# Patient Record
Sex: Male | Born: 1960 | Race: Black or African American | Hispanic: No | Marital: Married | Smoking: Former smoker
Health system: Southern US, Community
[De-identification: ages and names within clinical notes are randomized; demographics above are authoritative.]

## PROBLEM LIST (undated history)

## (undated) DIAGNOSIS — K5900 Constipation, unspecified: Secondary | ICD-10-CM

## (undated) DIAGNOSIS — E785 Hyperlipidemia, unspecified: Secondary | ICD-10-CM

## (undated) DIAGNOSIS — I219 Acute myocardial infarction, unspecified: Secondary | ICD-10-CM

## (undated) DIAGNOSIS — M2550 Pain in unspecified joint: Secondary | ICD-10-CM

## (undated) DIAGNOSIS — M1990 Unspecified osteoarthritis, unspecified site: Secondary | ICD-10-CM

## (undated) DIAGNOSIS — E669 Obesity, unspecified: Secondary | ICD-10-CM

## (undated) DIAGNOSIS — M5126 Other intervertebral disc displacement, lumbar region: Secondary | ICD-10-CM

## (undated) DIAGNOSIS — Z9989 Dependence on other enabling machines and devices: Secondary | ICD-10-CM

## (undated) DIAGNOSIS — G8929 Other chronic pain: Secondary | ICD-10-CM

## (undated) DIAGNOSIS — R600 Localized edema: Secondary | ICD-10-CM

## (undated) DIAGNOSIS — J302 Other seasonal allergic rhinitis: Secondary | ICD-10-CM

## (undated) DIAGNOSIS — J309 Allergic rhinitis, unspecified: Secondary | ICD-10-CM

## (undated) DIAGNOSIS — N529 Male erectile dysfunction, unspecified: Secondary | ICD-10-CM

## (undated) DIAGNOSIS — R0602 Shortness of breath: Secondary | ICD-10-CM

## (undated) DIAGNOSIS — M5136 Other intervertebral disc degeneration, lumbar region: Secondary | ICD-10-CM

## (undated) DIAGNOSIS — M51369 Other intervertebral disc degeneration, lumbar region without mention of lumbar back pain or lower extremity pain: Secondary | ICD-10-CM

## (undated) DIAGNOSIS — G039 Meningitis, unspecified: Secondary | ICD-10-CM

## (undated) DIAGNOSIS — G4733 Obstructive sleep apnea (adult) (pediatric): Secondary | ICD-10-CM

## (undated) DIAGNOSIS — H409 Unspecified glaucoma: Secondary | ICD-10-CM

## (undated) DIAGNOSIS — E7800 Pure hypercholesterolemia, unspecified: Secondary | ICD-10-CM

## (undated) DIAGNOSIS — I2510 Atherosclerotic heart disease of native coronary artery without angina pectoris: Secondary | ICD-10-CM

## (undated) DIAGNOSIS — F419 Anxiety disorder, unspecified: Secondary | ICD-10-CM

## (undated) DIAGNOSIS — I10 Essential (primary) hypertension: Secondary | ICD-10-CM

## (undated) DIAGNOSIS — Z0289 Encounter for other administrative examinations: Secondary | ICD-10-CM

## (undated) DIAGNOSIS — J45909 Unspecified asthma, uncomplicated: Secondary | ICD-10-CM

## (undated) DIAGNOSIS — K219 Gastro-esophageal reflux disease without esophagitis: Secondary | ICD-10-CM

## (undated) HISTORY — PX: MYRINGOTOMY WITH TUBE PLACEMENT: SHX5663

## (undated) HISTORY — DX: Obstructive sleep apnea (adult) (pediatric): G47.33

## (undated) HISTORY — DX: Encounter for other administrative examinations: Z02.89

## (undated) HISTORY — DX: Pain in unspecified joint: M25.50

## (undated) HISTORY — PX: VASECTOMY: SHX75

## (undated) HISTORY — DX: Shortness of breath: R06.02

## (undated) HISTORY — DX: Unspecified glaucoma: H40.9

## (undated) HISTORY — DX: Dependence on other enabling machines and devices: Z99.89

## (undated) HISTORY — PX: NASAL SINUS SURGERY: SHX719

## (undated) HISTORY — DX: Acute myocardial infarction, unspecified: I21.9

## (undated) HISTORY — PX: WISDOM TOOTH EXTRACTION: SHX21

## (undated) HISTORY — DX: Other chronic pain: G89.29

## (undated) HISTORY — DX: Allergic rhinitis, unspecified: J30.9

## (undated) HISTORY — DX: Hyperlipidemia, unspecified: E78.5

## (undated) HISTORY — DX: Unspecified osteoarthritis, unspecified site: M19.90

## (undated) HISTORY — DX: Atherosclerotic heart disease of native coronary artery without angina pectoris: I25.10

## (undated) HISTORY — DX: Localized edema: R60.0

## (undated) HISTORY — DX: Anxiety disorder, unspecified: F41.9

## (undated) HISTORY — DX: Pure hypercholesterolemia, unspecified: E78.00

## (undated) HISTORY — DX: Essential (primary) hypertension: I10

## (undated) HISTORY — DX: Male erectile dysfunction, unspecified: N52.9

## (undated) HISTORY — DX: Constipation, unspecified: K59.00

## (undated) HISTORY — DX: Obesity, unspecified: E66.9

---

## 1983-07-07 HISTORY — PX: ANKLE SURGERY: SHX546

## 1999-02-21 ENCOUNTER — Ambulatory Visit (HOSPITAL_COMMUNITY): Admission: EM | Admit: 1999-02-21 | Discharge: 1999-02-21 | Payer: Self-pay | Admitting: Internal Medicine

## 1999-10-31 ENCOUNTER — Encounter: Payer: Self-pay | Admitting: Orthopedic Surgery

## 1999-10-31 ENCOUNTER — Ambulatory Visit (HOSPITAL_COMMUNITY): Admission: RE | Admit: 1999-10-31 | Discharge: 1999-10-31 | Payer: Self-pay | Admitting: Orthopedic Surgery

## 2003-03-03 ENCOUNTER — Ambulatory Visit (HOSPITAL_BASED_OUTPATIENT_CLINIC_OR_DEPARTMENT_OTHER): Admission: RE | Admit: 2003-03-03 | Discharge: 2003-03-03 | Payer: Self-pay | Admitting: Otolaryngology

## 2003-04-13 ENCOUNTER — Ambulatory Visit (HOSPITAL_BASED_OUTPATIENT_CLINIC_OR_DEPARTMENT_OTHER): Admission: RE | Admit: 2003-04-13 | Discharge: 2003-04-13 | Payer: Self-pay | Admitting: Otolaryngology

## 2009-04-26 ENCOUNTER — Emergency Department (HOSPITAL_COMMUNITY): Admission: EM | Admit: 2009-04-26 | Discharge: 2009-04-26 | Payer: Self-pay | Admitting: Family Medicine

## 2009-06-24 ENCOUNTER — Emergency Department (HOSPITAL_COMMUNITY): Admission: EM | Admit: 2009-06-24 | Discharge: 2009-06-24 | Payer: Self-pay | Admitting: Emergency Medicine

## 2009-07-02 ENCOUNTER — Encounter: Admission: RE | Admit: 2009-07-02 | Discharge: 2009-07-02 | Payer: Self-pay | Admitting: Family Medicine

## 2009-07-03 ENCOUNTER — Encounter: Admission: RE | Admit: 2009-07-03 | Discharge: 2009-07-03 | Payer: Self-pay | Admitting: Family Medicine

## 2009-07-04 ENCOUNTER — Observation Stay (HOSPITAL_COMMUNITY): Admission: EM | Admit: 2009-07-04 | Discharge: 2009-07-05 | Payer: Self-pay | Admitting: Emergency Medicine

## 2010-10-06 LAB — COMPREHENSIVE METABOLIC PANEL
ALT: 30 U/L (ref 0–53)
AST: 27 U/L (ref 0–37)
Albumin: 3.3 g/dL — ABNORMAL LOW (ref 3.5–5.2)
Alkaline Phosphatase: 61 U/L (ref 39–117)
BUN: 12 mg/dL (ref 6–23)
CO2: 29 mEq/L (ref 19–32)
Calcium: 8.9 mg/dL (ref 8.4–10.5)
Chloride: 103 mEq/L (ref 96–112)
Creatinine, Ser: 1.03 mg/dL (ref 0.4–1.5)
GFR calc Af Amer: >60 mL/min (ref >60)
GFR calc non Af Amer: >60 mL/min (ref >60)
Glucose, Bld: 89 mg/dL (ref 70–99)
Potassium: 3.6 mEq/L (ref 3.5–5.1)
Sodium: 139 mEq/L (ref 135–145)
Total Bilirubin: 0.4 mg/dL (ref 0.3–1.2)
Total Protein: 7.6 g/dL (ref 6.0–8.3)

## 2010-10-06 LAB — DIFFERENTIAL
Basophils Absolute: 0.1 10*3/uL (ref 0.0–0.1)
Basophils Relative: 1 % (ref 0–1)
Eosinophils Absolute: 0.1 10*3/uL (ref 0.0–0.7)
Eosinophils Relative: 1 % (ref 0–5)
Lymphocytes Relative: 34 % (ref 12–46)
Lymphs Abs: 2.7 10*3/uL (ref 0.7–4.0)
Monocytes Absolute: 0.6 10*3/uL (ref 0.1–1.0)
Monocytes Relative: 7 % (ref 3–12)
Neutro Abs: 4.5 10*3/uL (ref 1.7–7.7)
Neutrophils Relative %: 57 % (ref 43–77)

## 2010-10-06 LAB — CBC
HCT: 38.9 % — ABNORMAL LOW (ref 39.0–52.0)
Hemoglobin: 13.1 g/dL (ref 13.0–17.0)
MCHC: 33.7 g/dL (ref 30.0–36.0)
MCV: 85.1 fL (ref 78.0–100.0)
Platelets: 250 10*3/uL (ref 150–400)
RBC: 4.58 MIL/uL (ref 4.22–5.81)
RDW: 13.4 % (ref 11.5–15.5)
WBC: 7.9 10*3/uL (ref 4.0–10.5)

## 2010-10-06 LAB — ANAEROBIC CULTURE

## 2010-10-06 LAB — EAR CULTURE: Culture: NO GROWTH

## 2010-10-06 LAB — PROTIME-INR
INR: 0.95 (ref 0.00–1.49)
Prothrombin Time: 12.6 seconds (ref 11.6–15.2)

## 2011-12-02 ENCOUNTER — Telehealth: Payer: Self-pay | Admitting: Oncology

## 2012-12-19 ENCOUNTER — Ambulatory Visit: Payer: Self-pay | Admitting: Family Medicine

## 2012-12-19 VITALS — BP 133/82 | HR 76 | Temp 98.0°F | Resp 18 | Ht 70.75 in | Wt 281.0 lb

## 2012-12-19 DIAGNOSIS — I10 Essential (primary) hypertension: Secondary | ICD-10-CM

## 2012-12-19 DIAGNOSIS — E7800 Pure hypercholesterolemia, unspecified: Secondary | ICD-10-CM

## 2012-12-19 DIAGNOSIS — Z0289 Encounter for other administrative examinations: Secondary | ICD-10-CM

## 2012-12-19 DIAGNOSIS — G4733 Obstructive sleep apnea (adult) (pediatric): Secondary | ICD-10-CM

## 2012-12-19 DIAGNOSIS — Z9989 Dependence on other enabling machines and devices: Secondary | ICD-10-CM | POA: Insufficient documentation

## 2012-12-19 HISTORY — DX: Obstructive sleep apnea (adult) (pediatric): G47.33

## 2012-12-19 HISTORY — DX: Essential (primary) hypertension: I10

## 2012-12-19 HISTORY — DX: Pure hypercholesterolemia, unspecified: E78.00

## 2012-12-19 HISTORY — DX: Encounter for other administrative examinations: Z02.89

## 2012-12-19 NOTE — Progress Notes (Signed)
[address]   [place], [place]  [address]   [phone]  Subjective:    Patient ID: Raymond Jordan, male    DOB: [birth year], 52 y.o.   MRN: [id number]  HPI This 52 y.o. male presents for self pay DOT.    PCP:  Shaune Pollack  HTN: followed every six months; compliance with medication.  OSA: compliance with CPAP; presenting with compliance report.  Hyperlipidemia: stable; compliance with medication.   Review of Systems  Constitutional: Negative for fever, chills, diaphoresis, activity change, appetite change and fatigue.  HENT: Negative for hearing loss, ear pain, sore throat, trouble swallowing, voice change, sinus pressure and tinnitus.   Eyes: Negative for visual disturbance.  Respiratory: Positive for apnea. Negative for cough, shortness of breath, wheezing and stridor.   Cardiovascular: Negative for chest pain, palpitations and leg swelling.  Gastrointestinal: Negative for nausea, vomiting, abdominal pain, diarrhea, constipation, blood in stool, abdominal distention, anal bleeding and rectal pain.  Genitourinary: Negative for urgency, frequency, decreased urine volume, penile swelling, scrotal swelling and testicular pain.  Musculoskeletal: Positive for arthralgias. Negative for myalgias, back pain, joint swelling and gait problem.  Skin: Negative for color change, pallor, rash and wound.  Neurological: Negative for dizziness, tremors, seizures, syncope, speech difficulty, weakness, numbness and headaches.  Psychiatric/Behavioral: Negative for sleep disturbance and dysphoric mood. The patient is not nervous/anxious.     Past Medical History  Diagnosis Date  . Hypertension   . Hyperlipidemia   . Obstructive sleep apnea on CPAP     Past Surgical History  Procedure Laterality Date  . Ankle surgery l      bone spur removal ankle.  . Nasal sinus surgery    . Admission  07/06/2009    meningitis    Prior to Admission medications   Medication Sig Start Date End Date  Taking? Authorizing Provider  amLODipine (NORVASC) 5 MG tablet Take 5 mg by mouth daily.   Yes Historical Provider, MD  colesevelam (WELCHOL) 625 MG tablet Take 1,875 mg by mouth 2 (two) times daily with a meal.   Yes Historical Provider, MD  losartan-hydrochlorothiazide (HYZAAR) 100-25 MG per tablet Take 1 tablet by mouth daily.   Yes Historical Provider, MD    Allergies  Allergen Reactions  . Crestor (Rosuvastatin)     Muscle aches  . Lisinopril     cough    History   Social History  . Marital Status: Married    Spouse Name: N/A    Number of Children: 2  . Years of Education: N/A   Occupational History  . Truck driver    Social History Main Topics  . Smoking status: Former Games developer  . Smokeless tobacco: Not on file     Comment: teenager  . Alcohol Use: 0.6 oz/week    1 Cans of beer per week     Comment: occasionally  . Drug Use: No  . Sexually Active: Yes   Other Topics Concern  . Not on file   Social History Narrative   Marital status: Married      Children:  2 children; 1 stepchild      Employment:  Naval architect; Medical illustrator; self employed; contracted with [employer]; drives in [place].       Tobacco:  No tobacco      Alcohol: + weekends      Drugs: none    Family History  Problem Relation Age of Onset  . Asthma Mother     sarcoidosis  also  . Diabetes Mother   . Stroke Father   . Cancer Father     R breast cancer  . Heart disease Father     AMI/CAD with stenting  . Arthritis Father        Objective:   Physical Exam  Nursing note and vitals reviewed. Constitutional: He is oriented to person, place, and time. He appears well-developed and well-nourished.  HENT:  Head: Normocephalic and atraumatic.  Right Ear: External ear normal.  Left Ear: External ear normal.  Nose: Nose normal.  Mouth/Throat: Oropharynx is clear and moist.  Eyes: Conjunctivae and EOM are normal. Pupils are equal, round, and reactive to light.  Neck: Normal range of  motion. Neck supple. No JVD present. No thyromegaly present.  Cardiovascular: Normal rate, regular rhythm, normal heart sounds and intact distal pulses.  Exam reveals no gallop and no friction rub.   No murmur heard. Pulmonary/Chest: Effort normal and breath sounds normal. He has no wheezes. He has no rales.  Abdominal: Soft. Bowel sounds are normal. He exhibits no distension and no mass. There is no tenderness. There is no rebound and no guarding. Hernia confirmed negative in the right inguinal area and confirmed negative in the left inguinal area.  Genitourinary: Testes normal and penis normal.  Musculoskeletal:       Right shoulder: Normal.       Left shoulder: Normal.       Cervical back: Normal.       Thoracic back: Normal.       Lumbar back: Normal.  Lymphadenopathy:    He has no cervical adenopathy.  Neurological: He is alert and oriented to person, place, and time. He has normal reflexes. No cranial nerve deficit. He exhibits normal muscle tone. Coordination normal.  Skin: Skin is warm and dry. No rash noted.  Psychiatric: He has a normal mood and affect. His behavior is normal. Judgment and thought content normal.        Assessment & Plan:  Health examination of defined subpopulation  Essential hypertension, benign  Pure hypercholesterolemia  OSA on CPAP   1.  DOT CPE: one year card provided.  Hypertension controlled; reviewed report of CPAP compliance and compliant with CPAP. 2.  HTN: controlled; one year card. 3.  Hyperlipidemia: stable; managed with Welchol. 4.  OSA on CPAP: compliance with CPAP.  One year card provided.

## 2013-01-21 ENCOUNTER — Encounter: Payer: Self-pay | Admitting: Family Medicine

## 2013-06-09 ENCOUNTER — Ambulatory Visit: Payer: Self-pay | Admitting: Cardiology

## 2013-06-26 ENCOUNTER — Encounter: Payer: Self-pay | Admitting: General Surgery

## 2013-06-26 DIAGNOSIS — E669 Obesity, unspecified: Secondary | ICD-10-CM

## 2013-06-26 DIAGNOSIS — G4733 Obstructive sleep apnea (adult) (pediatric): Secondary | ICD-10-CM

## 2013-06-26 HISTORY — DX: Obesity, unspecified: E66.9

## 2013-06-28 ENCOUNTER — Ambulatory Visit (INDEPENDENT_AMBULATORY_CARE_PROVIDER_SITE_OTHER): Payer: 59 | Admitting: Cardiology

## 2013-06-28 ENCOUNTER — Encounter: Payer: Self-pay | Admitting: Cardiology

## 2013-06-28 VITALS — BP 120/80 | HR 100 | Ht 70.0 in | Wt 284.1 lb

## 2013-06-28 DIAGNOSIS — I10 Essential (primary) hypertension: Secondary | ICD-10-CM

## 2013-06-28 DIAGNOSIS — G4733 Obstructive sleep apnea (adult) (pediatric): Secondary | ICD-10-CM

## 2013-06-28 DIAGNOSIS — R0789 Other chest pain: Secondary | ICD-10-CM

## 2013-06-28 DIAGNOSIS — E669 Obesity, unspecified: Secondary | ICD-10-CM

## 2013-06-28 NOTE — Progress Notes (Signed)
[address] [place], [place]  [address] Phone: [phone] Fax:  [phone]  Date:  06/28/2013   ID:  Raymond Jordan, DOB [birth year], MRN [id number]  PCP:  Hollice Espy, MD  Sleep Medicine:  Armanda Magic, MD   History of Present Illness: Raymond Jordan is a 52 y.o. male with a history of HTN, OSA and obesity who presents today for followup.  He is doing well with his CPAP therapy.  He tolerates his nasal pillow mask and feels the pressure is adequate.  He has no daytime sleepiness and feels rested when he gets up in the am.  He does not snore. He rides a bike and goes to the gym for exercise.  He says that he has been having some CP that he describes as a soreness that can come on at any time.  He thinks it may be musculoskeletal.     Wt Readings from Last 3 Encounters:  06/28/13 284 lb 1.9 oz (128.876 kg)  06/26/13 284 lb 9.6 oz (129.094 kg)  12/19/12 281 lb (127.461 kg)     Past Medical History  Diagnosis Date  . Hypertension   . Hyperlipidemia   . Obstructive sleep apnea on CPAP   . Allergy   . Allergic rhinitis     /asthma  . Asthma   . Osteoarthritis   . Hypercholesteremia   . ED (erectile dysfunction)     Current Outpatient Prescriptions  Medication Sig Dispense Refill  . amLODipine (NORVASC) 5 MG tablet Take 5 mg by mouth daily.      . cetirizine (ZYRTEC) 10 MG tablet Take 10 mg by mouth daily.      . clobetasol cream (TEMOVATE) 0.05 % As directed      . colesevelam (WELCHOL) 625 MG tablet Take 1,875 mg by mouth 2 (two) times daily with a meal.      . diclofenac (VOLTAREN) 75 MG EC tablet Take 75 mg by mouth 2 (two) times daily.      . fluticasone (FLONASE) 50 MCG/ACT nasal spray Place 2 sprays into both nostrils as needed for allergies or rhinitis.      Marland Kitchen losartan-hydrochlorothiazide (HYZAAR) 100-25 MG per tablet Take 1 tablet by mouth daily.       No current facility-administered medications for this visit.    Allergies:    Allergies    Allergen Reactions  . Crestor [Rosuvastatin]     Muscle aches  . Lisinopril     cough    Social History:  The patient  reports that he has never smoked. He does not have any smokeless tobacco history on file. He reports that he drinks about 0.6 ounces of alcohol per week. He reports that he does not use illicit drugs.   Family History:  The patient's family history includes Arthritis in his father; Asthma in his mother; Cancer in his father; Diabetes in his mother; Heart disease in his father; Stroke in his father.   ROS:  Please see the history of present illness.      All other systems reviewed and negative.   PHYSICAL EXAM: VS:  BP 120/80  Pulse 100  Ht 5\' 10"  (1.778 m)  Wt 284 lb 1.9 oz (128.876 kg)  BMI 40.77 kg/m2 Well nourished, well developed, in no acute distress HEENT: normal Neck: no JVD Cardiac:  normal S1, S2; RRR; no murmur Lungs:  clear to auscultation bilaterally, no wheezing, rhonchi or rales Abd: soft, nontender, no hepatomegaly Ext: no  edema Skin: warm and dry Neuro:  CNs 2-12 intact, no focal abnormalities noted       ASSESSMENT AND PLAN:  1. OSA on CPAP and tolerating well  - his download today showed an AHI of 0.5/hr.  His compliance was down due to him having to take care of his father but he is back to using it every night. 2. HTN - well controlled  - continue amlodipine/Hyzaar 3. Obesity  - I have encouraged him to continue to exercise and to watch his diet 4.  Atypical CP with normal EKG  - ETT to rule out ischemia given his CRF including age, male sex, HTN and dyslipidemia  Followup with me in 6 months  Signed, Armanda Magic, MD 06/28/2013 9:07 AM

## 2013-06-28 NOTE — Patient Instructions (Signed)
Your physician recommends that you continue on your current medications as directed. Please refer to the Current Medication list given to you today.  Your physician has requested that you have an exercise tolerance test. For further information please visit [email or web address] Please also follow instruction sheet, as given.  Your physician wants you to follow-up in: 6 Months with Dr Sherlyn Lick will receive a reminder letter in the mail two months in advance. If you don't receive a letter, please call our office to schedule the follow-up appointment.

## 2013-08-04 ENCOUNTER — Encounter: Payer: Self-pay | Admitting: Cardiology

## 2013-08-11 ENCOUNTER — Encounter: Payer: Self-pay | Admitting: Nurse Practitioner

## 2013-08-11 ENCOUNTER — Encounter: Payer: 59 | Admitting: Nurse Practitioner

## 2013-08-11 ENCOUNTER — Ambulatory Visit (INDEPENDENT_AMBULATORY_CARE_PROVIDER_SITE_OTHER): Payer: 59 | Admitting: Nurse Practitioner

## 2013-08-11 VITALS — BP 129/71 | HR 85

## 2013-08-11 DIAGNOSIS — R0789 Other chest pain: Secondary | ICD-10-CM

## 2013-08-11 NOTE — Progress Notes (Signed)
Exercise Treadmill Test  Pre-Exercise Testing Evaluation Rhythm: normal sinus  Rate: 85 bpm     Test  Exercise Tolerance Test Ordering MD: Armanda Magicraci Turner, MD  Interpreting MD: Norma FredricksonLori Gerhardt, NP  Unique Test No: 1  Treadmill:  1  Indication for ETT: chest pain - rule out ischemia  Contraindication to ETT: No   Stress Modality: exercise - treadmill  Cardiac Imaging Performed: non   Protocol: standard Bruce - maximal  Max BP:  170/84  Max MPHR (bpm):  170 85% MPR (bpm):  145  MPHR obtained (bpm):  173 % MPHR obtained:  101%  Reached 85% MPHR (min:sec):  4:14 Total Exercise Time (min-sec):  8 minutes  Workload in METS:  10.1 Borg Scale: 15  Reason ETT Terminated:  desired heart rate attained    ST Segment Analysis At Rest: normal ST segments - no evidence of significant ST depression With Exercise: non-specific ST changes; no significant ST depression.   Other Information Arrhythmia:  No Angina during ETT:  absent (0) Quality of ETT:  diagnostic  ETT Interpretation:  normal - no evidence of ischemia by ST analysis  Comments: Patient presents today for routine GXT. Has had atypical chest pain in the setting of HTN,OSA and obesity. He has had no further symptoms since his last visit here.    Today the patient exercised on the standard Bruce protocol for a total of 8 minutes.  Good exercise tolerance.  Adequate blood pressure response.  Clinically negative for chest pain. Test was stopped due to achievement of target HR.  EKG negative for ischemia. No significant arrhythmia noted.   Recommendations: CV risk factor modifications  See back as directed.   Patient is agreeable to this plan and will call if any problems develop in the interim.   Rosalio MacadamiaLori C. Gerhardt, RN, ANP-C [facility] [address] [place], [place]  [address] [phone]

## 2013-09-21 ENCOUNTER — Other Ambulatory Visit: Payer: Self-pay | Admitting: Orthopedic Surgery

## 2013-09-21 DIAGNOSIS — M25511 Pain in right shoulder: Secondary | ICD-10-CM

## 2013-09-30 ENCOUNTER — Ambulatory Visit
Admission: RE | Admit: 2013-09-30 | Discharge: 2013-09-30 | Disposition: A | Discharge status: Home / Self Care | Payer: 59 | Source: Ambulatory Visit | Attending: Orthopedic Surgery | Admitting: Orthopedic Surgery

## 2013-09-30 DIAGNOSIS — M25511 Pain in right shoulder: Secondary | ICD-10-CM

## 2013-10-02 ENCOUNTER — Other Ambulatory Visit: Payer: Self-pay | Admitting: Orthopedic Surgery

## 2013-10-02 DIAGNOSIS — M25512 Pain in left shoulder: Secondary | ICD-10-CM

## 2013-10-04 HISTORY — PX: SHOULDER OPEN ROTATOR CUFF REPAIR: SHX2407

## 2013-10-06 ENCOUNTER — Ambulatory Visit
Admission: RE | Admit: 2013-10-06 | Discharge: 2013-10-06 | Disposition: A | Discharge status: Home / Self Care | Payer: 59 | Source: Ambulatory Visit | Attending: Orthopedic Surgery | Admitting: Orthopedic Surgery

## 2013-10-06 DIAGNOSIS — M25512 Pain in left shoulder: Secondary | ICD-10-CM

## 2013-10-09 ENCOUNTER — Other Ambulatory Visit: Payer: 59

## 2013-10-16 ENCOUNTER — Telehealth: Payer: Self-pay | Admitting: Cardiology

## 2013-10-16 NOTE — Telephone Encounter (Signed)
New message     Having rotator cuff surgery----[facility] faxed clearance for surgery but have not heard back from us.  Please check to see if we got the clearance note and fax back to them.

## 2013-10-16 NOTE — Telephone Encounter (Signed)
We received this today and I called pt and LVM that Dr Mayford Knifeurner is not in Office and after she reviews tomorrow I will call the pt.

## 2013-10-17 ENCOUNTER — Telehealth: Payer: Self-pay | Admitting: Cardiology

## 2013-10-17 NOTE — Telephone Encounter (Signed)
Received request from Nurse fax box, documents faxed for surgical clearance. To: [facility] Fax number: [phone] Attention: 4.14.15/kdm

## 2013-10-17 NOTE — Telephone Encounter (Signed)
Pt was cleared from a cardiac standpoint for Sx. Dr Mayford Knifeurner signed Paper work and it will be faxed today. Pt is aware.

## 2013-12-06 ENCOUNTER — Encounter: Payer: Self-pay | Admitting: Cardiology

## 2013-12-07 ENCOUNTER — Ambulatory Visit (INDEPENDENT_AMBULATORY_CARE_PROVIDER_SITE_OTHER): Payer: 59 | Admitting: Cardiology

## 2013-12-07 ENCOUNTER — Encounter: Payer: Self-pay | Admitting: Cardiology

## 2013-12-07 VITALS — BP 130/86 | HR 72 | Ht 70.5 in | Wt 268.4 lb

## 2013-12-07 DIAGNOSIS — G4733 Obstructive sleep apnea (adult) (pediatric): Secondary | ICD-10-CM

## 2013-12-07 DIAGNOSIS — Z0289 Encounter for other administrative examinations: Secondary | ICD-10-CM

## 2013-12-07 DIAGNOSIS — E669 Obesity, unspecified: Secondary | ICD-10-CM

## 2013-12-07 NOTE — Patient Instructions (Signed)
Your physician recommends that you continue on your current medications as directed. Please refer to the Current Medication list given to you today.  Your physician wants you to follow-up in: 6 months with Dr Turner You will receive a reminder letter in the mail two months in advance. If you don't receive a letter, please call our office to schedule the follow-up appointment.  

## 2013-12-07 NOTE — Progress Notes (Signed)
[address] [place], [place]  [address] Phone: [phone] Fax:  [phone]  Date:  12/07/2013   ID:  Raymond HumblesHoward N Naugle, DOB 1961-03-24, MRN [id number]  PCP:  Hollice EspyGATES,DONNA RUTH, MD  Cardiologist:  Armanda Magicraci Damonica Chopra, MD   History of Present Illness: Raymond HumblesHoward N Jordan is a 53 y.o. male with a history of HTN, OSA and obesity who presents today for followup. He is doing well with his CPAP therapy. He tolerates his nasal pillow mask and feels the pressure is adequate. He has no daytime sleepiness and feels rested when he gets up in the am.He recently had rotator cuff surgery and was uncomfortable sleeping and could not use his CPAP therapy.   He does not snore. He is getting back to riding his bike.    Wt Readings from Last 3 Encounters:  12/07/13 268 lb 6.4 oz (121.745 kg)  06/28/13 284 lb 1.9 oz (128.876 kg)  06/26/13 284 lb 9.6 oz (129.094 kg)     Past Medical History  Diagnosis Date  . Hypertension   . Hyperlipidemia   . Obstructive sleep apnea on CPAP   . Allergy   . Allergic rhinitis     /asthma  . Asthma   . Osteoarthritis   . Hypercholesteremia   . ED (erectile dysfunction)     Current Outpatient Prescriptions  Medication Sig Dispense Refill  . amLODipine (NORVASC) 5 MG tablet Take 5 mg by mouth daily.      . cetirizine (ZYRTEC) 10 MG tablet Take 10 mg by mouth as needed.       . clobetasol cream (TEMOVATE) 0.05 % As directed      . colesevelam (WELCHOL) 625 MG tablet Take 625 mg by mouth 2 (two) times daily with a meal.       . diclofenac (VOLTAREN) 75 MG EC tablet Take 75 mg by mouth as needed.       . fluticasone (FLONASE) 50 MCG/ACT nasal spray Place 2 sprays into both nostrils as needed for allergies or rhinitis.      Marland Kitchen. losartan-hydrochlorothiazide (HYZAAR) 100-25 MG per tablet Take 1 tablet by mouth daily.       No current facility-administered medications for this visit.    Allergies:    Allergies  Allergen Reactions  . Crestor [Rosuvastatin]    Muscle aches  . Lipitor [Atorvastatin]     Social History:  The patient  reports that he has never smoked. He does not have any smokeless tobacco history on file. He reports that he drinks about .6 ounces of alcohol per week. He reports that he does not use illicit drugs.   Family History:  The patient's family history includes Arthritis in his father; Asthma in his mother; Cancer in his father; Diabetes in his mother; Heart disease in his father; Stroke in his father.   ROS:  Please see the history of present illness.      All other systems reviewed and negative.   PHYSICAL EXAM: VS:  BP 141/86  Pulse 72  Ht 5' 10.5" (1.791 m)  Wt 268 lb 6.4 oz (121.745 kg)  BMI 37.95 kg/m2 Well nourished, well developed, in no acute distress HEENT: normal Neck: no JVD Cardiac:  normal S1, S2; RRR; no murmur Lungs:  clear to auscultation bilaterally, no wheezing, rhonchi or rales Abd: soft, nontender, no hepatomegaly Ext: no edema Skin: warm and dry Neuro:  CNs 2-12 intact, no focal abnormalities noted  ASSESSMENT AND PLAN:  1. OSA on CPAP  and tolerating well- his download today showed an AHI of 0.6/hr. His compliance was down due to him having recent rotator cuff surgery and could not get comfortable sleeping.  I have recommended that he recheck a download in 4 weeks now that he is using it nightly since he needs a report for his DOT. 2. HTN - well controlled - continue amlodipine/Hyzaar  3. Obesity - I have encouraged him to continue to exercise and to watch his diet   Followup with me in 6 months    Signed, Armanda Magic, MD 12/07/2013 9:28 AM

## 2013-12-30 ENCOUNTER — Encounter: Payer: Self-pay | Admitting: Cardiology

## 2014-01-01 ENCOUNTER — Telehealth: Payer: Self-pay | Admitting: Cardiology

## 2014-01-01 NOTE — Telephone Encounter (Signed)
Walk in pt Form " [facility]" papers Dropped Off gave to Danielle C  6.29.15/km

## 2014-01-03 ENCOUNTER — Encounter: Payer: Self-pay | Admitting: General Surgery

## 2014-01-11 ENCOUNTER — Telehealth: Payer: Self-pay | Admitting: Cardiology

## 2014-01-11 NOTE — Telephone Encounter (Signed)
Spoke with pt he is aware letter should arrive in mail to him any day now.

## 2014-01-11 NOTE — Telephone Encounter (Signed)
New Message:  Pt is calling to check on the status of his letter of compliance.. States he needs it for his DOT physical... Requests a call back from ItmannDanielle.

## 2014-01-30 ENCOUNTER — Ambulatory Visit (INDEPENDENT_AMBULATORY_CARE_PROVIDER_SITE_OTHER): Payer: Self-pay | Admitting: Family Medicine

## 2014-01-30 ENCOUNTER — Telehealth: Payer: Self-pay | Admitting: Radiology

## 2014-01-30 VITALS — BP 133/84 | HR 65 | Temp 98.3°F | Resp 16 | Ht 71.0 in | Wt 278.2 lb

## 2014-01-30 DIAGNOSIS — Z0289 Encounter for other administrative examinations: Secondary | ICD-10-CM

## 2014-01-30 NOTE — Telephone Encounter (Signed)
Patient is having CPAP Compliance report faxed to me. I will get this sent to you as soon as this arrives.

## 2014-01-30 NOTE — Progress Notes (Signed)
Physical exam  History: Patient is here for his DOT physical exam. He has been healthy and has no major complaints.  Past medical history: Medical: Hypertension Surgeries: None Injuries: None Medications: Amlodipine, hydrochlorothiazide, losartan, WelChol Allergies: None  Social history: Drive the truck, always at night. Sees Shaune [facility]ackonna Gates [facility].  Review of systems: Unremarkable  Physical exam: Overweight male in no acute distress. TMs normal. Eyes PERRLA. Fundi benign. Throat clear. Teeth good. Neck supple without nodes. No carotid bruits. Chest clear. Heart regular without murmurs. And soft without mass or tenderness. Normal male external genitalia with no hernias. Is unremarkable the spine normal.  Assessment: DOT exam Hypertension Hyperlipidemia Obesity Sleep apnea  Plan: Height weight loss One year DOT card Needs to bring his sleep study report in. I had missed this was on the form, but we have contacted him and he will get us the report.

## 2014-01-31 NOTE — Telephone Encounter (Signed)
Done

## 2014-01-31 NOTE — Telephone Encounter (Signed)
Actually that one was pending still I think .  Will watch for it.

## 2014-02-03 NOTE — Telephone Encounter (Signed)
Done

## 2014-04-11 ENCOUNTER — Encounter: Payer: Self-pay | Admitting: Cardiology

## 2014-05-16 ENCOUNTER — Encounter: Payer: Self-pay | Admitting: Cardiology

## 2014-06-15 ENCOUNTER — Ambulatory Visit (INDEPENDENT_AMBULATORY_CARE_PROVIDER_SITE_OTHER): Payer: 59 | Admitting: Cardiology

## 2014-06-15 VITALS — BP 132/88 | HR 79 | Ht 71.0 in | Wt 285.0 lb

## 2014-06-15 DIAGNOSIS — I10 Essential (primary) hypertension: Secondary | ICD-10-CM

## 2014-06-15 DIAGNOSIS — Z9989 Dependence on other enabling machines and devices: Principal | ICD-10-CM

## 2014-06-15 DIAGNOSIS — G4733 Obstructive sleep apnea (adult) (pediatric): Secondary | ICD-10-CM

## 2014-06-15 DIAGNOSIS — E669 Obesity, unspecified: Secondary | ICD-10-CM

## 2014-06-15 NOTE — Patient Instructions (Signed)
Your physician wants you to follow-up in: 6 months with Dr. Turner. You will receive a reminder letter in the mail two months in advance. If you don't receive a letter, please call our office to schedule the follow-up appointment.  

## 2014-06-15 NOTE — Progress Notes (Signed)
[address] [place], [place]  [address] Phone: [phone] Fax:  [phone]  Date:  06/15/2014   ID:  Raymond Jordan, DOB 17-Aug-1960, MRN [id number]  PCP:  Hollice EspyGATES,DONNA RUTH, MD  Cardiologist:  Armanda Magicraci Turner, MD    History of Present Illness: Raymond HumblesHoward N Strauch is a 53 y.o. male with a history of HTN, OSA and obesity who presents today for followup. He is doing well with his CPAP therapy. He tolerates his nasal pillow mask and feels the pressure is adequate. He has no daytime sleepiness and feels rested when he gets up in the am. He does not snore.    Wt Readings from Last 3 Encounters:  06/15/14 285 lb (129.275 kg)  01/30/14 278 lb 3.2 oz (126.191 kg)  12/07/13 268 lb 6.4 oz (121.745 kg)     Past Medical History  Diagnosis Date  . Hypertension   . Hyperlipidemia   . Obstructive sleep apnea on CPAP   . Allergy   . Allergic rhinitis     /asthma  . Asthma   . Osteoarthritis   . Hypercholesteremia   . ED (erectile dysfunction)     Current Outpatient Prescriptions  Medication Sig Dispense Refill  . amLODipine (NORVASC) 5 MG tablet Take 5 mg by mouth daily.    . cetirizine (ZYRTEC) 10 MG tablet Take 10 mg by mouth as needed.     . clobetasol cream (TEMOVATE) 0.05 % Apply 1 application topically. As directed    . colesevelam (WELCHOL) 625 MG tablet Take 625 mg by mouth 2 (two) times daily with a meal.     . fluticasone (FLONASE) 50 MCG/ACT nasal spray Place 2 sprays into both nostrils as needed for allergies or rhinitis.    Marland Kitchen. ibuprofen (ADVIL,MOTRIN) 800 MG tablet Take 800 mg by mouth every 8 (eight) hours as needed.    Marland Kitchen. losartan-hydrochlorothiazide (HYZAAR) 100-25 MG per tablet Take 1 tablet by mouth daily.     No current facility-administered medications for this visit.    Allergies:    Allergies  Allergen Reactions  . Crestor [Rosuvastatin]     Muscle aches  . Lipitor [Atorvastatin]     Social History:  The patient  reports that he has never smoked.  He does not have any smokeless tobacco history on file. He reports that he drinks about 0.6 oz of alcohol per week. He reports that he does not use illicit drugs.   Family History:  The patient's family history includes Arthritis in his father; Asthma in his mother; Cancer in his father; Diabetes in his mother; Heart disease in his father; Stroke in his father.   ROS:  Please see the history of present illness.      All other systems reviewed and negative.   PHYSICAL EXAM: VS:  BP 132/88 mmHg  Pulse 79  Ht 5\' 11"  (1.803 m)  Wt 285 lb (129.275 kg)  BMI 39.77 kg/m2 Well nourished, well developed, in no acute distress HEENT: normal Neck: no JVD Cardiac:  normal S1, S2; RRR; no murmur Lungs:  clear to auscultation bilaterally, no wheezing, rhonchi or rales Abd: soft, nontender, no hepatomegaly Ext: no edema Skin: warm and dry Neuro:  CNs 2-12 intact, no focal abnormalities noted  EKG:  NSR with no ST changes     ASSESSMENT AND PLAN:  2. OSA on CPAP and tolerating well- I will get a download from his DME 3. HTN - well controlled - continue amlodipine/Hyzaar  3. Obesity -  I have encouraged him to continue to exercise and to watch his diet.  I told him that he needs to do at least 45 minutes of aerobic exercise daily.  Followup with me in 6 months   Signed, Armanda Magicraci Turner, MD [facility] 06/15/2014 11:02 AM

## 2014-09-13 ENCOUNTER — Encounter: Payer: Self-pay | Admitting: Cardiology

## 2014-11-29 ENCOUNTER — Encounter: Payer: Self-pay | Admitting: Cardiology

## 2014-11-29 ENCOUNTER — Ambulatory Visit (INDEPENDENT_AMBULATORY_CARE_PROVIDER_SITE_OTHER): Payer: 59 | Admitting: Cardiology

## 2014-11-29 VITALS — BP 110/70 | HR 83 | Ht 71.0 in | Wt 275.0 lb

## 2014-11-29 DIAGNOSIS — G4733 Obstructive sleep apnea (adult) (pediatric): Secondary | ICD-10-CM | POA: Diagnosis not present

## 2014-11-29 DIAGNOSIS — I10 Essential (primary) hypertension: Secondary | ICD-10-CM | POA: Diagnosis not present

## 2014-11-29 DIAGNOSIS — E669 Obesity, unspecified: Secondary | ICD-10-CM

## 2014-11-29 DIAGNOSIS — Z9989 Dependence on other enabling machines and devices: Principal | ICD-10-CM

## 2014-11-29 NOTE — Progress Notes (Signed)
Cardiology Office Note   Date:  11/29/2014   ID:  Raymond Jordan, DOB [birth year], MRN [id number]  PCP:  Hollice Espy, MD    Chief Complaint  Patient presents with  . Follow-up    OSA      History of Present Illness: Raymond Jordan is a 54 y.o. male with a history of HTN, OSA and obesity who presents today for followup. He is doing well with his CPAP therapy. He tolerates his nasal pillow mask and feels the pressure is adequate. He sleeps well at night.  He has no daytime sleepiness and feels rested when he gets up in the am. He does not snore if he uses his CPAP.  He denies any nasal congestion when using the CPAP.    Past Medical History  Diagnosis Date  . Hypertension   . Hyperlipidemia   . Obstructive sleep apnea on CPAP   . Allergy   . Allergic rhinitis     /asthma  . Asthma   . Osteoarthritis   . Hypercholesteremia   . ED (erectile dysfunction)     Past Surgical History  Procedure Laterality Date  . Ankle surgery l      bone spur removal ankle.  . Nasal sinus surgery    . Admission  07/06/2009    meningitis     Current Outpatient Prescriptions  Medication Sig Dispense Refill  . amLODipine (NORVASC) 5 MG tablet Take 5 mg by mouth daily.    . cetirizine (ZYRTEC) 10 MG tablet Take 10 mg by mouth as needed.     . clobetasol cream (TEMOVATE) 0.05 % Apply 1 application topically. As directed    . colesevelam (WELCHOL) 625 MG tablet Take 625 mg by mouth 2 (two) times daily with a meal.     . fluticasone (FLONASE) 50 MCG/ACT nasal spray Place 2 sprays into both nostrils as needed for allergies or rhinitis.    Marland Kitchen ibuprofen (ADVIL,MOTRIN) 800 MG tablet Take 800 mg by mouth every 8 (eight) hours as needed.    Marland Kitchen losartan-hydrochlorothiazide (HYZAAR) 100-25 MG per tablet Take 1 tablet by mouth daily.    . Multiple Vitamins-Minerals (MULTIVITAMIN MEN PO) Take 1 capsule by mouth daily.    Marland Kitchen OVER THE COUNTER MEDICATION Take 1 capsule by mouth 3 (three) times  daily. Hard burn 775 mg capsule (fat burner) by mouth three times daily    . OVER THE COUNTER MEDICATION Take 2 capsules by mouth 2 (two) times daily. Omega Shred take 2 capsules by mouth twice daily. (lean support)     No current facility-administered medications for this visit.    Allergies:   Crestor and Lipitor    Social History:  The patient  reports that he has never smoked. He does not have any smokeless tobacco history on file. He reports that he drinks about 0.6 oz of alcohol per week. He reports that he does not use illicit drugs.   Family History:  The patient's family history includes Arthritis in his father; Asthma in his mother; Cancer in his father; Diabetes in his mother; Heart disease in his father; Stroke in his father.    ROS:  Please see the history of present illness.   Otherwise, review of systems are positive for none.   All other systems are reviewed and negative.    PHYSICAL EXAM: VS:  BP 110/70 mmHg  Pulse 83  Ht  (1.803 m)  Wt 275 lb (124.739 kg)  BMI 38.37 kg/m2  SpO2 98% , BMI Body mass index is 38.37 kg/(m^2). GEN: Well nourished, well developed, in no acute distress HEENT: normal Neck: no JVD, carotid bruits, or masses Cardiac: RRR; no murmurs, rubs, or gallops,no edema  Respiratory:  clear to auscultation bilaterally, normal work of breathing GI: soft, nontender, nondistended, + BS MS: no deformity or atrophy Skin: warm and dry, no rash Neuro:  Strength and sensation are intact Psych: euthymic mood, full affect   EKG:  EKG is not ordered today.    Recent Labs: No results found for requested labs within last 365 days.    Lipid Panel No results found for: CHOL, TRIG, HDL, CHOLHDL, VLDL, LDLCALC, LDLDIRECT    Wt Readings from Last 3 Encounters:  11/29/14 275 lb (124.739 kg)  06/15/14 285 lb (129.275 kg)  01/30/14 278 lb 3.2 oz (126.191 kg)    ASSESSMENT AND PLAN:  2. OSA on CPAP and tolerating well- his d/l today showed an AHI  of 0.4 per hour on 11cm H2O with 70% compliance in using more than 4 hours nightly.  3. HTN - well controlled - continue amlodipine/Hyzaar  3. Obesity - he has lost 10 lbs - I have encouraged him to continue to exercise and to watch his diet.    Current medicines are reviewed at length with the patient today.  The patient does not have concerns regarding medicines.  The following changes have been made:  no change  Labs/ tests ordered today include: see above assessment and plan No orders of the defined types were placed in this encounter.     Disposition:   FU with me in 1 year   Signed, Quintella ReichertURNER,TRACI R, MD  11/29/2014 1:54 PM    [facility] [address], [place], [place]  [address] Phone: [phone]; Fax: [phone]

## 2014-11-29 NOTE — Patient Instructions (Signed)

## 2014-12-20 ENCOUNTER — Other Ambulatory Visit: Payer: Self-pay | Admitting: *Deleted

## 2014-12-20 ENCOUNTER — Telehealth: Payer: Self-pay | Admitting: Cardiology

## 2014-12-20 DIAGNOSIS — G4733 Obstructive sleep apnea (adult) (pediatric): Secondary | ICD-10-CM

## 2014-12-20 DIAGNOSIS — Z9989 Dependence on other enabling machines and devices: Principal | ICD-10-CM

## 2014-12-20 NOTE — Telephone Encounter (Signed)
Orders have been put in for new tubing.   [facility] notified.  Patient was notified that this has been done. He stated verbal understanding

## 2014-12-20 NOTE — Telephone Encounter (Signed)
New Message       Pt calling stating that he blew a hose on his Cpap machine and has been in contact with [facility] and they stated they do not have an itemized prescription for the hose in order to get him a new one. [facility] stating [facility] a request and pt is calling to find out if we received it and if so if it could be expedited. Please call back and advise.

## 2015-01-08 ENCOUNTER — Telehealth: Payer: Self-pay | Admitting: Cardiology

## 2015-01-11 ENCOUNTER — Ambulatory Visit (INDEPENDENT_AMBULATORY_CARE_PROVIDER_SITE_OTHER): Payer: Self-pay | Admitting: Family Medicine

## 2015-01-11 VITALS — BP 120/82 | HR 79 | Temp 98.6°F | Resp 16 | Ht 70.75 in | Wt 262.0 lb

## 2015-01-11 DIAGNOSIS — Z9989 Dependence on other enabling machines and devices: Secondary | ICD-10-CM

## 2015-01-11 DIAGNOSIS — E7800 Pure hypercholesterolemia, unspecified: Secondary | ICD-10-CM

## 2015-01-11 DIAGNOSIS — G4733 Obstructive sleep apnea (adult) (pediatric): Secondary | ICD-10-CM

## 2015-01-11 DIAGNOSIS — Z0289 Encounter for other administrative examinations: Secondary | ICD-10-CM

## 2015-01-11 DIAGNOSIS — E669 Obesity, unspecified: Secondary | ICD-10-CM

## 2015-01-11 DIAGNOSIS — I10 Essential (primary) hypertension: Secondary | ICD-10-CM

## 2015-01-11 NOTE — Progress Notes (Signed)
Subjective:    Patient ID: Raymond Jordan, male    DOB: [birth year], 54 y.o.   MRN: [id number]  01/11/2015  DOT PHYSICAL   HPI This 54 y.o. male presents for DOT physical.  Last DOT physical 01/30/2014 by Alwyn Ren.  HTN: maintained on medication daily; followed by Dr. Kevan Ny.   OSA: brought in compliance report.  70% days > 4 hours of usage; total usage days 83%.  Followed by Dr. Mayford Knife of cardiology.  OA shoulders, lower back: stable.     Review of Systems  Constitutional: Negative for fever, chills, diaphoresis, activity change, appetite change, fatigue and unexpected weight change.  HENT: Negative for congestion, dental problem, drooling, ear discharge, ear pain, facial swelling, hearing loss, mouth sores, nosebleeds, postnasal drip, rhinorrhea, sinus pressure, sneezing, sore throat, tinnitus, trouble swallowing and voice change.   Eyes: Negative for photophobia, pain, discharge, redness, itching and visual disturbance.  Respiratory: Negative for apnea, cough, choking, chest tightness, shortness of breath, wheezing and stridor.   Cardiovascular: Negative for chest pain, palpitations and leg swelling.  Gastrointestinal: Negative for nausea, vomiting, abdominal pain, diarrhea, constipation and blood in stool.  Endocrine: Negative for cold intolerance, heat intolerance, polydipsia, polyphagia and polyuria.  Genitourinary: Negative for dysuria, urgency, frequency, hematuria, flank pain, decreased urine volume, discharge, penile swelling, scrotal swelling, enuresis, difficulty urinating, genital sores, penile pain and testicular pain.  Musculoskeletal: Positive for arthralgias. Negative for myalgias, back pain, joint swelling, gait problem, neck pain and neck stiffness.  Skin: Negative for color change, pallor, rash and wound.  Allergic/Immunologic: Negative for environmental allergies, food allergies and immunocompromised state.  Neurological: Negative for dizziness, tremors, seizures,  syncope, facial asymmetry, speech difficulty, weakness, light-headedness, numbness and headaches.  Hematological: Negative for adenopathy. Does not bruise/bleed easily.  Psychiatric/Behavioral: Negative for suicidal ideas, hallucinations, behavioral problems, confusion, sleep disturbance, self-injury, dysphoric mood, decreased concentration and agitation. The patient is not nervous/anxious and is not hyperactive.     Past Medical History  Diagnosis Date  . Hypertension   . Hyperlipidemia   . Obstructive sleep apnea on CPAP   . Allergy   . Allergic rhinitis     /asthma  . Asthma   . Hypercholesteremia   . ED (erectile dysfunction)   . Osteoarthritis     OA knees, shoulders; DDD lumbar spine   Past Surgical History  Procedure Laterality Date  . Ankle surgery l      bone spur removal ankle.  . Nasal sinus surgery    . Admission  07/06/2009    meningitis  . Rotator cuff surgery r  07/06/2013   Allergies  Allergen Reactions  . Crestor [Rosuvastatin]     Muscle aches  . Lipitor [Atorvastatin]    Current Outpatient Prescriptions  Medication Sig Dispense Refill  . amLODipine (NORVASC) 5 MG tablet Take 5 mg by mouth daily.    . cetirizine (ZYRTEC) 10 MG tablet Take 10 mg by mouth as needed.     . clobetasol cream (TEMOVATE) 0.05 % Apply 1 application topically. As directed    . colesevelam (WELCHOL) 625 MG tablet Take 625 mg by mouth 2 (two) times daily with a meal.     . fluticasone (FLONASE) 50 MCG/ACT nasal spray Place 2 sprays into both nostrils as needed for allergies or rhinitis.    Marland Kitchen ibuprofen (ADVIL,MOTRIN) 800 MG tablet Take 800 mg by mouth every 8 (eight) hours as needed.    Marland Kitchen losartan-hydrochlorothiazide (HYZAAR) 100-25 MG per tablet Take 1 tablet by mouth daily.    Marland Kitchen  Multiple Vitamins-Minerals (MULTIVITAMIN MEN PO) Take 1 capsule by mouth daily.    Marland Kitchen OVER THE COUNTER MEDICATION Take 1 capsule by mouth 3 (three) times daily. Hard burn 775 mg capsule (fat burner) by mouth  three times daily    . OVER THE COUNTER MEDICATION Take 2 capsules by mouth 2 (two) times daily. Omega Shred take 2 capsules by mouth twice daily. (lean support)     No current facility-administered medications for this visit.   History   Social History  . Marital Status: Married    Spouse Name: N/A  . Number of Children: 2  . Years of Education: N/A   Occupational History  . Truck driver    Social History Main Topics  . Smoking status: Never Smoker   . Smokeless tobacco: Not on file     Comment: teenager  . Alcohol Use: 0.6 oz/week    1 Cans of beer per week     Comment: occasionally  . Drug Use: No  . Sexual Activity: Yes   Other Topics Concern  . Not on file   Social History Narrative   Marital status: Married x 30 years      Children:  2 children; 1 stepchild; grandchild and two step grandchildren.      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      Tobacco:  No tobacco      Alcohol: + weekends sometimes; two drinks per month.      Drugs: none      Exercise:  Walking and cycling.   Family History  Problem Relation Age of Onset  . Asthma Mother     sarcoidosis also  . Diabetes Mother   . Stroke Mother   . Sarcoidosis Mother   . Stroke Father   . Cancer Father     R breast cancer  . Heart disease Father     AMI/CAD with stenting  . Arthritis Father        Objective:    BP 120/82 mmHg  Pulse 79  Temp(Src) 98.6 F (37 C) (Oral)  Resp 16  Ht 5' 10.75" (1.797 m)  Wt 262 lb (118.842 kg)  BMI 36.80 kg/m2 Physical Exam  Constitutional: He is oriented to person, place, and time. He appears well-developed and well-nourished. No distress.  obese  HENT:  Head: Normocephalic and atraumatic.  Right Ear: External ear normal.  Left Ear: External ear normal.  Nose: Nose normal.  Mouth/Throat: Oropharynx is clear and moist.  Eyes: Conjunctivae and EOM are normal. Pupils are  equal, round, and reactive to light.  Neck: Normal range of motion. Neck supple. Carotid bruit is not present. No thyromegaly present.  Cardiovascular: Normal rate, regular rhythm, normal heart sounds and intact distal pulses.  Exam reveals no gallop and no friction rub.   No murmur heard. Pulmonary/Chest: Effort normal and breath sounds normal. He has no wheezes. He has no rales.  Abdominal: Soft. Bowel sounds are normal. He exhibits no distension and no mass. There is no tenderness. There is no rebound and no guarding. Hernia confirmed negative in the right inguinal area and confirmed negative in the left inguinal area.  Genitourinary: Testes normal and penis normal. Right testis shows no mass, no swelling and no tenderness. Left testis shows no mass, no swelling and no tenderness. Circumcised.  Musculoskeletal:       Right shoulder: He exhibits decreased range of motion. He exhibits no  tenderness, no bony tenderness, no pain and no spasm.       Left shoulder: Normal. He exhibits normal range of motion and no tenderness.       Cervical back: Normal. He exhibits normal range of motion and no tenderness.       Thoracic back: Normal. He exhibits normal range of motion and no tenderness.       Lumbar back: Normal. He exhibits normal range of motion and no tenderness.  Lymphadenopathy:    He has no cervical adenopathy.       Right: No inguinal adenopathy present.       Left: No inguinal adenopathy present.  Neurological: He is alert and oriented to person, place, and time. He has normal reflexes. No cranial nerve deficit. He exhibits normal muscle tone. Coordination normal.  Skin: Skin is warm and dry. No rash noted. He is not diaphoretic.  Psychiatric: He has a normal mood and affect. His behavior is normal. Judgment and thought content normal.        Assessment & Plan:   1. Physical examination of employee   2. Pure hypercholesterolemia   3. OSA on CPAP   4. Essential hypertension, benign    5. Obesity    -1 year DOT card provided due to HTN, OSA. -HTN: controlled; one year card. -OSA: compliance with CPAP -Hypercholesterolemia: stable on Welchol. -Obesity: has lost 20 pounds in past year; encourage ongoing weight loss. -R rotator cuff bursitis: full ROM yet slow.  Clearance for employment.   No orders of the defined types were placed in this encounter.    No Follow-up on file.    Kiearra Oyervides Paulita FujitaMartin Paulyne Mooty, M.D. [facility]  [place] [address] [place], [place]  [address] [phone] phone [phone] fax

## 2015-09-14 ENCOUNTER — Emergency Department (HOSPITAL_COMMUNITY): Payer: 59

## 2015-09-14 ENCOUNTER — Encounter (HOSPITAL_COMMUNITY): Payer: Self-pay

## 2015-09-14 ENCOUNTER — Inpatient Hospital Stay (HOSPITAL_COMMUNITY)
Admission: EM | Admit: 2015-09-14 | Discharge: 2015-09-17 | DRG: 247 | Disposition: A | Discharge status: Home / Self Care | Payer: 59 | Attending: Internal Medicine | Admitting: Internal Medicine

## 2015-09-14 DIAGNOSIS — E7800 Pure hypercholesterolemia, unspecified: Secondary | ICD-10-CM | POA: Diagnosis present

## 2015-09-14 DIAGNOSIS — Z6836 Body mass index (BMI) 36.0-36.9, adult: Secondary | ICD-10-CM

## 2015-09-14 DIAGNOSIS — E876 Hypokalemia: Secondary | ICD-10-CM | POA: Diagnosis not present

## 2015-09-14 DIAGNOSIS — E785 Hyperlipidemia, unspecified: Secondary | ICD-10-CM | POA: Diagnosis present

## 2015-09-14 DIAGNOSIS — Z9989 Dependence on other enabling machines and devices: Secondary | ICD-10-CM

## 2015-09-14 DIAGNOSIS — R0789 Other chest pain: Secondary | ICD-10-CM | POA: Diagnosis not present

## 2015-09-14 DIAGNOSIS — I200 Unstable angina: Secondary | ICD-10-CM | POA: Diagnosis not present

## 2015-09-14 DIAGNOSIS — I214 Non-ST elevation (NSTEMI) myocardial infarction: Secondary | ICD-10-CM | POA: Diagnosis present

## 2015-09-14 DIAGNOSIS — E669 Obesity, unspecified: Secondary | ICD-10-CM

## 2015-09-14 DIAGNOSIS — I10 Essential (primary) hypertension: Secondary | ICD-10-CM | POA: Diagnosis not present

## 2015-09-14 DIAGNOSIS — Z955 Presence of coronary angioplasty implant and graft: Secondary | ICD-10-CM

## 2015-09-14 DIAGNOSIS — Z8249 Family history of ischemic heart disease and other diseases of the circulatory system: Secondary | ICD-10-CM

## 2015-09-14 DIAGNOSIS — Z79899 Other long term (current) drug therapy: Secondary | ICD-10-CM

## 2015-09-14 DIAGNOSIS — Z825 Family history of asthma and other chronic lower respiratory diseases: Secondary | ICD-10-CM

## 2015-09-14 DIAGNOSIS — J45909 Unspecified asthma, uncomplicated: Secondary | ICD-10-CM | POA: Diagnosis present

## 2015-09-14 DIAGNOSIS — Z888 Allergy status to other drugs, medicaments and biological substances status: Secondary | ICD-10-CM

## 2015-09-14 DIAGNOSIS — G4733 Obstructive sleep apnea (adult) (pediatric): Secondary | ICD-10-CM

## 2015-09-14 DIAGNOSIS — Z7982 Long term (current) use of aspirin: Secondary | ICD-10-CM

## 2015-09-14 DIAGNOSIS — R079 Chest pain, unspecified: Secondary | ICD-10-CM

## 2015-09-14 HISTORY — DX: Meningitis, unspecified: G03.9

## 2015-09-14 HISTORY — DX: Unspecified asthma, uncomplicated: J45.909

## 2015-09-14 HISTORY — DX: Unspecified osteoarthritis, unspecified site: M19.90

## 2015-09-14 HISTORY — DX: Other seasonal allergic rhinitis: J30.2

## 2015-09-14 LAB — CREATININE, SERUM: CREATININE: 1.14 mg/dL (ref 0.61–1.24)

## 2015-09-14 LAB — BASIC METABOLIC PANEL
Anion gap: 8 (ref 5–15)
BUN: 17 mg/dL (ref 6–20)
CHLORIDE: 105 mmol/L (ref 101–111)
CO2: 27 mmol/L (ref 22–32)
CREATININE: 1.15 mg/dL (ref 0.61–1.24)
Calcium: 9.3 mg/dL (ref 8.9–10.3)
GFR calc Af Amer: >60 mL/min (ref >60)
Glucose, Bld: 105 mg/dL — ABNORMAL HIGH (ref 65–99)
Potassium: 3.4 mmol/L — ABNORMAL LOW (ref 3.5–5.1)
SODIUM: 140 mmol/L (ref 135–145)

## 2015-09-14 LAB — CBC
HEMATOCRIT: 41.6 % (ref 39.0–52.0)
HEMATOCRIT: 39.3 % (ref 39.0–52.0)
HEMOGLOBIN: 14.1 g/dL (ref 13.0–17.0)
Hemoglobin: 12.8 g/dL — ABNORMAL LOW (ref 13.0–17.0)
MCH: 28.6 pg (ref 26.0–34.0)
MCH: 27.6 pg (ref 26.0–34.0)
MCHC: 33.9 g/dL (ref 30.0–36.0)
MCHC: 32.6 g/dL (ref 30.0–36.0)
MCV: 84.4 fL (ref 78.0–100.0)
MCV: 84.7 fL (ref 78.0–100.0)
Platelets: 178 10*3/uL (ref 150–400)
Platelets: 159 10*3/uL (ref 150–400)
RBC: 4.93 MIL/uL (ref 4.22–5.81)
RBC: 4.64 MIL/uL (ref 4.22–5.81)
RDW: 13.1 % (ref 11.5–15.5)
RDW: 13.1 % (ref 11.5–15.5)
WBC: 6 10*3/uL (ref 4.0–10.5)
WBC: 5.3 10*3/uL (ref 4.0–10.5)

## 2015-09-14 LAB — TSH: TSH: 1.156 u[IU]/mL (ref 0.350–4.500)

## 2015-09-14 LAB — I-STAT TROPONIN, ED: TROPONIN I, POC: 0.03 ng/mL (ref 0.00–0.08)

## 2015-09-14 LAB — HEPARIN LEVEL (UNFRACTIONATED): Heparin Unfractionated: 0.5 IU/mL (ref 0.30–0.70)

## 2015-09-14 LAB — TROPONIN I
TROPONIN I: 0.11 ng/mL — AB (ref <0.031)
Troponin I: 0.26 ng/mL — ABNORMAL HIGH (ref <0.031)

## 2015-09-14 MED ORDER — METOPROLOL TARTRATE 25 MG PO TABS
25.0000 mg | ORAL_TABLET | Freq: Two times a day (BID) | ORAL | Status: DC
  Administered 2015-09-14 – 2015-09-17 (×6): 25 mg via ORAL
  Filled 2015-09-14 (×7): qty 1

## 2015-09-14 MED ORDER — LOSARTAN POTASSIUM-HCTZ 100-25 MG PO TABS: 1.0000 | ORAL_TABLET | Freq: Every day | ORAL | Status: DC

## 2015-09-14 MED ORDER — ALUM & MAG HYDROXIDE-SIMETH 200-200-20 MG/5ML PO SUSP
30.0000 mL | Freq: Four times a day (QID) | ORAL | Status: DC | PRN
Start: 2015-09-14 — End: 2015-09-17

## 2015-09-14 MED ORDER — ASPIRIN 81 MG PO CHEW
243.0000 mg | CHEWABLE_TABLET | Freq: Once | ORAL | Status: AC
  Administered 2015-09-14: 243 mg via ORAL
  Filled 2015-09-14: qty 3

## 2015-09-14 MED ORDER — LOSARTAN POTASSIUM 50 MG PO TABS
100.0000 mg | ORAL_TABLET | Freq: Every day | ORAL | Status: DC
  Administered 2015-09-14 – 2015-09-17 (×4): 100 mg via ORAL
  Filled 2015-09-14 (×4): qty 2

## 2015-09-14 MED ORDER — ACETAMINOPHEN 325 MG PO TABS
650.0000 mg | ORAL_TABLET | Freq: Four times a day (QID) | ORAL | Status: DC | PRN
Start: 2015-09-14 — End: 2015-09-17
  Administered 2015-09-15 (×3): 650 mg via ORAL
  Filled 2015-09-14 (×4): qty 2

## 2015-09-14 MED ORDER — MORPHINE SULFATE (PF) 2 MG/ML IV SOLN: 2.0000 mg | INTRAVENOUS | Status: DC | PRN

## 2015-09-14 MED ORDER — HEPARIN (PORCINE) IN NACL 100-0.45 UNIT/ML-% IJ SOLN
1300.0000 [IU]/h | INTRAMUSCULAR | Status: DC
  Administered 2015-09-14 – 2015-09-16 (×3): 1300 [IU]/h via INTRAVENOUS
  Filled 2015-09-14 (×4): qty 250

## 2015-09-14 MED ORDER — HEPARIN BOLUS VIA INFUSION
4000.0000 [IU] | Freq: Once | INTRAVENOUS | Status: AC
  Administered 2015-09-14: 4000 [IU] via INTRAVENOUS
  Filled 2015-09-14: qty 4000

## 2015-09-14 MED ORDER — HEPARIN BOLUS VIA INFUSION
4000.0000 [IU] | Freq: Once | INTRAVENOUS | Status: DC
  Filled 2015-09-14: qty 4000

## 2015-09-14 MED ORDER — ASPIRIN 81 MG PO CHEW: 81.0000 mg | CHEWABLE_TABLET | Freq: Once | ORAL | Status: DC

## 2015-09-14 MED ORDER — HYDROCHLOROTHIAZIDE 25 MG PO TABS
25.0000 mg | ORAL_TABLET | Freq: Every day | ORAL | Status: DC
  Administered 2015-09-14: 25 mg via ORAL
  Filled 2015-09-14: qty 1

## 2015-09-14 MED ORDER — SODIUM CHLORIDE 0.9% FLUSH
3.0000 mL | Freq: Two times a day (BID) | INTRAVENOUS | Status: DC
  Administered 2015-09-14 (×2): 3 mL via INTRAVENOUS

## 2015-09-14 MED ORDER — PRAVASTATIN SODIUM 40 MG PO TABS
20.0000 mg | ORAL_TABLET | Freq: Every day | ORAL | Status: DC
  Administered 2015-09-14 – 2015-09-16 (×3): 20 mg via ORAL
  Filled 2015-09-14 (×4): qty 1

## 2015-09-14 MED ORDER — PANTOPRAZOLE SODIUM 40 MG IV SOLR
40.0000 mg | Freq: Two times a day (BID) | INTRAVENOUS | Status: DC
  Administered 2015-09-14 (×2): 40 mg via INTRAVENOUS
  Filled 2015-09-14 (×2): qty 40

## 2015-09-14 MED ORDER — POTASSIUM CHLORIDE CRYS ER 20 MEQ PO TBCR
20.0000 meq | EXTENDED_RELEASE_TABLET | Freq: Once | ORAL | Status: AC
  Administered 2015-09-14: 20 meq via ORAL
  Filled 2015-09-14: qty 1

## 2015-09-14 MED ORDER — ACETAMINOPHEN 650 MG RE SUPP: 650.0000 mg | Freq: Four times a day (QID) | RECTAL | Status: DC | PRN

## 2015-09-14 MED ORDER — NITROGLYCERIN 2 % TD OINT
1.0000 [in_us] | TOPICAL_OINTMENT | Freq: Three times a day (TID) | TRANSDERMAL | Status: DC
  Administered 2015-09-14 – 2015-09-16 (×6): 1 [in_us] via TOPICAL
  Filled 2015-09-14 (×2): qty 1

## 2015-09-14 MED ORDER — ENOXAPARIN SODIUM 40 MG/0.4ML ~~LOC~~ SOLN: 40.0000 mg | SUBCUTANEOUS | Status: DC

## 2015-09-14 NOTE — ED Notes (Signed)
Pt arrived via POV c/o left sided chest pain first felt 2 weeks ago, has been constant this morning.  Unable to describe pain just knows it isn't sharp.

## 2015-09-14 NOTE — ED Notes (Signed)
MD Nedra HaiLee texted paged, " Marius DitchHoward Plascencia Room B15 Patient Troponin was 0.11   Joshva Labreck [id number]"

## 2015-09-14 NOTE — Progress Notes (Signed)
ANTICOAGULATION CONSULT NOTE - Follow-up Consult  Pharmacy Consult for heparin Indication: ACS/STEMI  Allergies  Allergen Reactions  . Crestor [Rosuvastatin]     Muscle aches  . Lipitor [Atorvastatin]     Patient Measurements: Height: 5\' 11"  (180.3 cm) Weight: 262 lb (118.842 kg) IBW/kg (Calculated) : 75.3 Heparin Dosing Weight: 102 kg  Vital Signs: Temp: 97.7 F (36.5 C) (03/11 1517) Temp Source: Oral (03/11 1517) BP: 114/74 mmHg (03/11 1517) Pulse Rate: 69 (03/11 1430)  Labs:  Recent Labs  09/14/15 0645 09/14/15 1158 09/14/15 1814 09/14/15 2016  HGB 14.1 12.8*  --   --   HCT 41.6 39.3  --   --   PLT 178 159  --   --   HEPARINUNFRC  --   --   --  0.50  CREATININE 1.15 1.14  --   --   TROPONINI  --  0.11* 0.26*  --     Estimated Creatinine Clearance: 99.4 mL/min (by C-G formula based on Cr of 1.14).   Assessment: 55 y/o M on heparin for r/o ACS. Heparin level therapeutic on 1300 units/hr. No bleeding noted. Likely will need cath.  Goal of Therapy:  Heparin level 0.3-0.7 units/ml Monitor platelets by anticoagulation protocol: Yes   Plan:  Continue heparin gtt at 1300 units/hr Daily HL, CBC  Christoper Fabianaron Lessa Huge, PharmD, BCPS Clinical pharmacist, pager [phone] 09/14/2015,8:53 PM

## 2015-09-14 NOTE — H&P (Addendum)
[facility] History and Physical  Raymond Jordan:[id number] DOB: 1960/12/31    PCP:   Hollice Espy, MD   Chief Complaint: CHEST PAIN FOR LAST 2 WEEKS.  HPI: Raymond Jordan is an 55 y.o. 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  He said it was not pleuritic, and sometimes with exertions, and others at rest.  He did not have SOB, nausea, or diaphoresis. No symptoms of GERD, black or bloody stool.  He has no known CAD, and denied tobacco use.  In the ER,  CXR showed no acute process, negative troponin, and negative EKD.  He was not having pain in the ER.  Work up included a normal CBC, and normal renal Fx tests, with K of 3.4.   He sees Dr Carolanne Grumbling for OSA and HTN.   Hospitalist was asked to admit him for atypical chest pain r./out.   Rewiew of Systems:  Constitutional: Negative for malaise, fever and chills. No significant weight loss or weight gain Eyes: Negative for eye pain, redness and discharge, diplopia, visual changes, or flashes of light. ENMT: Negative for ear pain, hoarseness, nasal congestion, sinus pressure and sore throat. No headaches; tinnitus, drooling, or problem swallowing. Cardiovascular: Negative for palpitations, diaphoresis, dyspnea and peripheral edema. ; No orthopnea, PND Respiratory: Negative for cough, hemoptysis, wheezing and stridor. No pleuritic chestpain. Gastrointestinal: Negative for nausea, vomiting, diarrhea, constipation, abdominal pain, melena, blood in stool, hematemesis, jaundice and rectal bleeding.    Genitourinary: Negative for frequency, dysuria, incontinence,flank pain and hematuria; Musculoskeletal: Negative for back pain and neck pain. Negative for swelling and trauma.;  Skin: . Negative for pruritus, rash, abrasions, bruising and skin lesion.; ulcerations Neuro: Negative for headache,  lightheadedness and neck stiffness. Negative for weakness, altered level of consciousness , altered mental status, extremity weakness, burning feet, involuntary movement, seizure and syncope.  Psych: negative for anxiety, depression, insomnia, tearfulness, panic attacks, hallucinations, paranoia, suicidal or homicidal ideation    Past Medical History  Diagnosis Date  . Hypertension   . Hyperlipidemia   . Obstructive sleep apnea on CPAP   . Allergy   . Allergic rhinitis     /asthma  . Asthma   . Hypercholesteremia   . ED (erectile dysfunction)   . Osteoarthritis     OA knees, shoulders; DDD lumbar spine    Past Surgical History  Procedure Laterality Date  . Ankle surgery l      bone spur removal ankle.  . Nasal sinus surgery    . Admission  07/06/2009    meningitis  . Rotator cuff surgery r  07/06/2013    Medications:  HOME MEDS: Prior to Admission medications   Medication Sig Start Date End Date Taking? Authorizing Provider  amLODipine (NORVASC) 5 MG tablet Take 5 mg by mouth daily.   Yes Historical Provider, MD  aspirin 81 MG chewable tablet Chew 81 mg by mouth once.   Yes Historical Provider, MD  cetirizine (ZYRTEC) 10 MG tablet Take 10 mg by mouth as needed for allergies.    Yes Historical Provider, MD  fluticasone (FLONASE) 50 MCG/ACT nasal spray Place 2 sprays into both nostrils as needed for allergies or rhinitis.   Yes Historical Provider, MD  losartan-hydrochlorothiazide (HYZAAR) 100-25 MG per tablet Take 1 tablet by mouth daily.   Yes Historical Provider, MD  Multiple Vitamins-Minerals (MULTIVITAMIN MEN  PO) Take 1 capsule by mouth daily.   Yes Historical Provider, MD  OVER THE COUNTER MEDICATION Take 1 capsule by mouth 3 (three) times daily. Hard burn 775 mg capsule (fat burner) by mouth three times daily   Yes Historical Provider, MD  OVER THE COUNTER MEDICATION Take 2 capsules by mouth 2 (two) times daily. Omega Shred take 2 capsules by mouth twice daily. (lean  support)   Yes Historical Provider, MD  pravastatin (PRAVACHOL) 20 MG tablet Take 20 mg by mouth daily.   Yes Historical Provider, MD     Allergies:  Allergies  Allergen Reactions  . Crestor [Rosuvastatin]     Muscle aches  . Lipitor [Atorvastatin]     Social History:   reports that he has never smoked. He does not have any smokeless tobacco history on file. He reports that he drinks about 0.6 oz of alcohol per week. He reports that he does not use illicit drugs.  Family History: Family History  Problem Relation Age of Onset  . Asthma Mother     sarcoidosis also  . Diabetes Mother   . Stroke Mother   . Sarcoidosis Mother   . Stroke Father   . Cancer Father     R breast cancer  . Heart disease Father     AMI/CAD with stenting  . Arthritis Father      Physical Exam: Filed Vitals:   09/14/15 0745 09/14/15 0800 09/14/15 0830 09/14/15 0900  BP: 114/91 121/78 122/85 126/90  Pulse: 75 64 71 71  Temp:      TempSrc:      Resp:  Height:      Weight:      SpO2: 99% 97% 96% 99%   Blood pressure 126/90, pulse 71, temperature 98 F (36.7 C), temperature source Oral, resp. rate 18, height  (1.803 m), weight 118.871 kg (262 lb 1 oz), SpO2 99 %.  GEN:  Pleasant  patient lying in the stretcher in no acute distress; cooperative with exam. PSYCH:  alert and oriented x4; does not appear anxious or depressed; affect is appropriate. HEENT: Mucous membranes pink and anicteric; PERRLA; EOM intact; no cervical lymphadenopathy nor thyromegaly or carotid bruit; no JVD; There were no stridor. Neck is very supple. Breasts:: Not examined CHEST WALL: No tenderness CHEST: Normal respiration, clear to auscultation bilaterally.  HEART: Regular rate and rhythm.  There are no murmur, rub, or gallops.   BACK: No kyphosis or scoliosis; no CVA tenderness ABDOMEN: soft and non-tender; no masses, no organomegaly, normal abdominal bowel sounds; no pannus; no intertriginous candida. There  is no rebound and no distention. Rectal Exam: Not done EXTREMITIES: No bone or joint deformity; age-appropriate arthropathy of the hands and knees; no edema; no ulcerations.  There is no calf tenderness. Genitalia: not examined PULSES: 2+ and symmetric SKIN: Normal hydration no rash or ulceration CNS: Cranial nerves 2-12 grossly intact no focal lateralizing neurologic deficit.  Speech is fluent; uvula elevated with phonation, facial symmetry and tongue midline. DTR are normal bilaterally, cerebella exam is intact, barbinski is negative and strengths are equaled bilaterally.  No sensory loss.   Labs on Admission:  Basic Metabolic Panel:  Recent Labs Lab 09/14/15 0645  NA 140  K 3.4*  CL 105  CO2 27  GLUCOSE 105*  BUN 17  CREATININE 1.15  CALCIUM 9.3   CBC:  Recent Labs Lab 09/14/15 0645  WBC 6.0  HGB 14.1  HCT 41.6  MCV 84.4  PLT 178    Radiological Exams on Admission: Dg Chest 2 View  09/14/2015  CLINICAL DATA:  Left-sided chest pain for a week. EXAM: CHEST  2 VIEW COMPARISON:  None. FINDINGS: The heart, hila, mediastinum, lungs, and pleura are normal. There is erosion of the distal right clavicle which is nonspecific but could be from previous trauma. No other acute abnormalities. IMPRESSION: No acute abnormalities seen within the chest. Erosion of the distal right clavicle is probably not acute and could be from previous trauma. Recommend clinical correlation. Electronically Signed   By: Gerome Samavid  Williams III M.D   On: 09/14/2015 08:10    EKG: Independently reviewed.   Assessment/Plan Present on Admission:  . Atypical chest pain . Essential hypertension, benign . Obesity . OSA on CPAP . Pure hypercholesterolemia  PLAN:  Will admit him for atypical CP r/out and will cycle his troponins and continue with his ASA.  I will change his Norvasc to Lopressor for his HTN as he would benefit being on a betablocker.  WIll continue with his Pravastatin, and add NTP while  ruling him out.  Obtain cardiac ECHO.  I am not certain his pain is cardiac, and he would benefit getting a stress test, either outpatient or inpatient. Will start him on an PPI as well, and obtain lipid profile.  He is stable, full code, [facility]  Thank you and Good Day.   Other plans as per orders. Code Status: FULL Unk LightningODE.    Juli Odom, MD. FACP [facility] Pager [phone] 7pm to 7am.  09/14/2015, 9:42 AM      ADDENDUM:  2nd Troponin came back at 0.11.  Spoke with PA, will start IV heparin, and consult cardiology.  He will likely need a cardiac cath.

## 2015-09-14 NOTE — ED Notes (Signed)
Ordered patient lunch meal tray.

## 2015-09-14 NOTE — ED Notes (Signed)
Patient ate lunch Tray advised will order dinner if he is still down here.

## 2015-09-14 NOTE — Consult Note (Signed)
CONSULT NOTE  Date: 09/14/2015               Patient Name:  Raymond Jordan MRN: [id number]  DOB: 07-13-1960 Age / Sex: 55 y.o., male        PCP: Hollice Espy Primary Cardiologist: Mayford Knife,             Referring Physician: Conley Rolls              Reason for Consult: Chest pain , + Troponin            History of Present Illness: Patient is a 55 y.o. male with a PMHx of HTN, OSA, obestty. , who was admitted to [facility] on 09/14/2015 for evaluation of atypical CP We were called when the 2nd troponin returned elevated at 0.11  Just start back exercising in January Would have some mild CP with working out on the elliptical Last night had more cp  Gradually the pains are occurring more often.  Last 30-60 seconds. This am had moire severe CP  When he woke up to go to BP Off and on Upper left chest pain, no radiation.   Not associated with dyspne, No sweats, no syndcope   Works as a Naval architect ,  Home every night Changed his diet for the better in january  Uses CPAP for his osa   Medications: Outpatient medications:  (Not in a hospital admission)  Current medications: Current Facility-Administered Medications  Medication Dose Route Frequency Provider Last Rate Last Dose  . acetaminophen (TYLENOL) tablet 650 mg  650 mg Oral Q6H PRN Houston Siren, MD       Or  . acetaminophen (TYLENOL) suppository 650 mg  650 mg Rectal Q6H PRN Houston Siren, MD      . alum & mag hydroxide-simeth (MAALOX/MYLANTA) 200-200-20 MG/5ML suspension 30 mL  30 mL Oral Q6H PRN Houston Siren, MD      . aspirin chewable tablet 81 mg  81 mg Oral Once Houston Siren, MD   Stopped at 09/14/15 1136  . heparin ADULT infusion 100 units/mL (25000 units/250 mL)  1,300 Units/hr Intravenous Continuous Maryland Pink, RPH      . heparin bolus via infusion 4,000 Units  4,000 Units Intravenous Once Maryland Pink, [facility]      . losartan-hydrochlorothiazide (HYZAAR) 100-25 MG per tablet 1 tablet  1 tablet Oral Daily Houston Siren, MD        . metoprolol tartrate (LOPRESSOR) tablet 25 mg  25 mg Oral BID Houston Siren, MD   25 mg at 09/14/15 1153  . morphine 2 MG/ML injection 2 mg  2 mg Intravenous Q2H PRN Houston Siren, MD      . nitroGLYCERIN (NITROGLYN) 2 % ointment 1 inch  1 inch Topical 3 times per day Houston Siren, MD   1 inch at 09/14/15 1402  . pantoprazole (PROTONIX) injection 40 mg  40 mg Intravenous Q12H Houston Siren, MD   40 mg at 09/14/15 1028  . pravastatin (PRAVACHOL) tablet 20 mg  20 mg Oral Daily Houston Siren, MD   20 mg at 09/14/15 1153  . sodium chloride flush (NS) 0.9 % injection 3 mL  3 mL Intravenous Q12H Houston Siren, MD   3 mL at 09/14/15 1148   Current Outpatient Prescriptions  Medication Sig Dispense Refill  . amLODipine (NORVASC) 5 MG tablet Take 5 mg by mouth daily.    Marland Kitchen aspirin 81 MG chewable tablet Chew 81 mg by mouth once.    Marland Kitchen  cetirizine (ZYRTEC) 10 MG tablet Take 10 mg by mouth as needed for allergies.     . fluticasone (FLONASE) 50 MCG/ACT nasal spray Place 2 sprays into both nostrils as needed for allergies or rhinitis.    Marland Kitchen. losartan-hydrochlorothiazide (HYZAAR) 100-25 MG per tablet Take 1 tablet by mouth daily.    . Multiple Vitamins-Minerals (MULTIVITAMIN MEN PO) Take 1 capsule by mouth daily.    Marland Kitchen. OVER THE COUNTER MEDICATION Take 1 capsule by mouth 3 (three) times daily. Hard burn 775 mg capsule (fat burner) by mouth three times daily    . OVER THE COUNTER MEDICATION Take 2 capsules by mouth 2 (two) times daily. Omega Shred take 2 capsules by mouth twice daily. (lean support)    . pravastatin (PRAVACHOL) 20 MG tablet Take 20 mg by mouth daily.       Allergies  Allergen Reactions  . Crestor [Rosuvastatin]     Muscle aches  . Lipitor [Atorvastatin]      Past Medical History  Diagnosis Date  . Hypertension   . Hyperlipidemia   . Obstructive sleep apnea on CPAP   . Allergy   . Allergic rhinitis     /asthma  . Asthma   . Hypercholesteremia   . ED (erectile dysfunction)   . Osteoarthritis     OA knees,  shoulders; DDD lumbar spine    Past Surgical History  Procedure Laterality Date  . Ankle surgery l      bone spur removal ankle.  . Nasal sinus surgery    . Admission  07/06/2009    meningitis  . Rotator cuff surgery r  07/06/2013    Family History  Problem Relation Age of Onset  . Asthma Mother     sarcoidosis also  . Diabetes Mother   . Stroke Mother   . Sarcoidosis Mother   . Stroke Father   . Cancer Father     R breast cancer  . Heart disease Father     AMI/CAD with stenting  . Arthritis Father     Social History:  reports that he has never smoked. He does not have any smokeless tobacco history on file. He reports that he drinks about 0.6 oz of alcohol per week. He reports that he does not use illicit drugs.   Review of Systems: Constitutional:  denies fever, chills, diaphoresis, appetite change and fatigue.  HEENT: denies photophobia, eye pain, redness, hearing loss, ear pain, congestion, sore throat, rhinorrhea, sneezing, neck pain, neck stiffness and tinnitus.  Respiratory: denies SOB, DOE, cough, chest tightness, and wheezing.  Cardiovascular: admits to chest pain,   Gastrointestinal: denies nausea, vomiting, abdominal pain, diarrhea, constipation, blood in stool.  Genitourinary: denies dysuria, urgency, frequency, hematuria, flank pain and difficulty urinating.  Musculoskeletal: denies  myalgias, back pain, joint swelling, arthralgias and gait problem.   Skin: denies pallor, rash and wound.  Neurological: denies dizziness, seizures, syncope, weakness, light-headedness, numbness and headaches.   Hematological: denies adenopathy, easy bruising, personal or family bleeding history.  Psychiatric/ Behavioral: denies suicidal ideation, mood changes, confusion, nervousness, sleep disturbance and agitation.    Physical Exam: BP 122/87 mmHg  Pulse 82  Temp(Src) 98 F (36.7 C) (Oral)  Resp 20  Ht 5\' 11"  (1.803 m)  Wt 262 lb 1 oz (118.871 kg)  BMI 36.57 kg/m2  SpO2  98%  Wt Readings from Last 3 Encounters:  09/14/15 262 lb 1 oz (118.871 kg)  01/11/15 262 lb (118.842 kg)  11/29/14 275 lb (124.739 kg)  General: Vital signs reviewed and noted. Well-developed, well-nourished, in no acute distress; alert,   Head: Normocephalic, atraumatic, sclera anicteric,   Neck: Supple. Negative for carotid bruits. No JVD   Lungs:  Clear bilaterally, no  wheezes, rales, or rhonchi. Breathing is normal   Heart: RRR with S1 S2. No murmurs, rubs, or gallops   Abdomen/ GI :  Soft, non-tender, non-distended with normoactive bowel sounds. No hepatomegaly. No rebound/guarding. No obvious abdominal masses   MSK: Strength and the appear normal for age.   Extremities: No clubbing or cyanosis. No edema.  Distal pedal pulses are 2+ and equal   Neurologic:  CN are grossly intact,  No obvious motor or sensory defect.  Alert and oriented X 3. Moves all extremities spontaneously.  Psych: Responds to questions appropriately with a normal affect.     Lab results: Basic Metabolic Panel:  Recent Labs Lab 09/14/15 0645 09/14/15 1158  NA 140  --   K 3.4*  --   CL 105  --   CO2 27  --   GLUCOSE 105*  --   BUN 17  --   CREATININE 1.15 1.14  CALCIUM 9.3  --     Liver Function Tests: No results for input(s): AST, ALT, ALKPHOS, BILITOT, PROT, ALBUMIN in the last 168 hours. No results for input(s): LIPASE, AMYLASE in the last 168 hours. No results for input(s): AMMONIA in the last 168 hours.  CBC:  Recent Labs Lab 09/14/15 0645 09/14/15 1158  WBC 6.0 5.3  HGB 14.1 12.8*  HCT 41.6 39.3  MCV 84.4 84.7  PLT 178 159    Cardiac Enzymes:  Recent Labs Lab 09/14/15 1158  TROPONINI 0.11*    BNP: Invalid input(s): POCBNP  CBG: No results for input(s): GLUCAP in the last 168 hours.  Coagulation Studies: No results for input(s): LABPROT, INR in the last 72 hours.   Other results:  Personal review of EKG shows :  NSR .  TWI inversion in III ( new from previous  ECG)    Imaging: Dg Chest 2 View  09/14/2015  CLINICAL DATA:  Left-sided chest pain for a week. EXAM: CHEST  2 VIEW COMPARISON:  None. FINDINGS: The heart, hila, mediastinum, lungs, and pleura are normal. There is erosion of the distal right clavicle which is nonspecific but could be from previous trauma. No other acute abnormalities. IMPRESSION: No acute abnormalities seen within the chest. Erosion of the distal right clavicle is probably not acute and could be from previous trauma. Recommend clinical correlation. Electronically Signed   By: Gerome Sam III M.D   On: 09/14/2015 08:10        Assessment & Plan:  1, unstable angina Symptoms are concerning for UPA  - progressive CP, exertional, relieved with "NTG Has been getting worse for months   Now has CP with + trooponin  Will check emzuymes. Likely will need cardiac cath Agree with heparin / NTG  2. OSA - stable       Vesta Mixer, Montez Hageman., MD, [facility] 09/14/2015, 2:40 PM Office - [phone] Pager [phone]

## 2015-09-14 NOTE — Progress Notes (Signed)
ANTICOAGULATION CONSULT NOTE - Initial Consult  Pharmacy Consult for heparin Indication: ACS/STEMI  Allergies  Allergen Reactions  . Crestor [Rosuvastatin]     Muscle aches  . Lipitor [Atorvastatin]     Patient Measurements: Height: 5\' 11"  (180.3 cm) Weight: 262 lb 1 oz (118.871 kg) IBW/kg (Calculated) : 75.3 Heparin Dosing Weight: 102 kg  Vital Signs: Temp: 98 F (36.7 C) (03/11 0621) Temp Source: Oral (03/11 0621) BP: 113/80 mmHg (03/11 1330) Pulse Rate: 61 (03/11 1330)  Labs:  Recent Labs  09/14/15 0645 09/14/15 1158  HGB 14.1 12.8*  HCT 41.6 39.3  PLT 178 159  CREATININE 1.15 1.14  TROPONINI  --  0.11*    Estimated Creatinine Clearance: 99.4 mL/min (by C-G formula based on Cr of 1.14).   Medical History: Past Medical History  Diagnosis Date  . Hypertension   . Hyperlipidemia   . Obstructive sleep apnea on CPAP   . Allergy   . Allergic rhinitis     /asthma  . Asthma   . Hypercholesteremia   . ED (erectile dysfunction)   . Osteoarthritis     OA knees, shoulders; DDD lumbar spine    Assessment:  55 y/o M w/ PMH HTN, HLD, OSA on CPAP on 09/14/2015 w/ CP. Troponin 0.11. Pharmacy consulted to dose heparin for ACS/STEMI. Hgb 12.8, plts 159. CrCl ~ 100 mL/min. Heparin dosing weight 102 kg  Goal of Therapy:  Heparin level 0.3-0.7 units/ml Monitor platelets by anticoagulation protocol: Yes   Plan:  Heparin bolus IV 4000u x 1, then Heparin gtt 1300 units/hr 6 hr HL Daily HL, CBC Monitor for S&S of bleed  Sandi CarneNick Agostino Gorin, PharmD Pharmacy Resident Pager: [phone] 09/14/2015,1:50 PM

## 2015-09-14 NOTE — ED Provider Notes (Signed)
CSN: [id number]     Arrival date & time 09/14/15  4098 History   First MD Initiated Contact with Patient 09/14/15 [phone]     Chief Complaint  Patient presents with  . Chest Pain     (Consider location/radiation/quality/duration/timing/severity/associated sxs/prior Treatment) HPI Comments: Raymond Jordan is a 55 y.o. male with a PMHx of HTN, HLD, OSA on CPAP, allergic rhinitis, asthma, and OA/lumbar DDD, who presents to the ED with complaints of 2 weeks of intermittent chest pain that worsened again today at 3 AM while he was sleeping. His present pain is 2/10 intermittent left-sided chest pain which is aching and nonradiating, improves somewhat with 81 mg aspirin taken this morning, and worsens with exertion. He denies any fevers, chills, shortness of breath, leg swelling, recent travel/surgery/immobilization, history of DVT/PE, orthopnea, claudication, diaphoresis, lightheadedness, abdominal pain, nausea vomiting diarrhea constipation, dysuria, hematuria, numbness, tingling, weakness, or smoking use. Positive family history of MI and CAD in his father. His PCP is Shaune Pollack at [facility], and he follows up with Carolanne Grumbling of [facility] for his sleep apnea.  Patient is a 55 y.o. male presenting with chest pain. The history is provided by the patient. No language interpreter was used.  Chest Pain Pain location:  L chest Pain quality: aching   Pain radiates to:  Does not radiate Pain radiates to the back: no   Pain severity:  Mild Onset quality:  Gradual Duration:  2 weeks Timing:  Intermittent Progression:  Waxing and waning Chronicity:  New Context: at rest   Relieved by:  Aspirin Worsened by:  Exertion Ineffective treatments:  None tried Associated symptoms: no abdominal pain, no claudication, no diaphoresis, no fever, no lower extremity edema, no nausea, no numbness, no orthopnea, no shortness of breath, not vomiting and no weakness   Risk factors: high cholesterol, hypertension and  male sex   Risk factors: no diabetes mellitus, no immobilization, no prior DVT/PE, no smoking and no surgery     Past Medical History  Diagnosis Date  . Hypertension   . Hyperlipidemia   . Obstructive sleep apnea on CPAP   . Allergy   . Allergic rhinitis     /asthma  . Asthma   . Hypercholesteremia   . ED (erectile dysfunction)   . Osteoarthritis     OA knees, shoulders; DDD lumbar spine   Past Surgical History  Procedure Laterality Date  . Ankle surgery l      bone spur removal ankle.  . Nasal sinus surgery    . Admission  07/06/2009    meningitis  . Rotator cuff surgery r  07/06/2013   Family History  Problem Relation Age of Onset  . Asthma Mother     sarcoidosis also  . Diabetes Mother   . Stroke Mother   . Sarcoidosis Mother   . Stroke Father   . Cancer Father     R breast cancer  . Heart disease Father     AMI/CAD with stenting  . Arthritis Father    Social History  Substance Use Topics  . Smoking status: Never Smoker   . Smokeless tobacco: None     Comment: teenager  . Alcohol Use: 0.6 oz/week    1 Cans of beer per week     Comment: occasionally    Review of Systems  Constitutional: Negative for fever, chills and diaphoresis.  Respiratory: Negative for shortness of breath.   Cardiovascular: Positive for chest pain. Negative for orthopnea, claudication and leg swelling.  Gastrointestinal: Negative for nausea, vomiting, abdominal pain, diarrhea and constipation.  Genitourinary: Negative for dysuria and hematuria.  Musculoskeletal: Negative for myalgias and arthralgias.  Skin: Negative for color change.  Allergic/Immunologic: Negative for immunocompromised state.  Neurological: Negative for weakness, light-headedness and numbness.  Psychiatric/Behavioral: Negative for confusion.   10 Systems reviewed and are negative for acute change except as noted in the HPI.    Allergies  Crestor and Lipitor  Home Medications   Prior to Admission medications    Medication Sig Start Date End Date Taking? Authorizing Provider  amLODipine (NORVASC) 5 MG tablet Take 5 mg by mouth daily.   Yes Historical Provider, MD  aspirin 81 MG chewable tablet Chew 81 mg by mouth once.   Yes Historical Provider, MD  cetirizine (ZYRTEC) 10 MG tablet Take 10 mg by mouth as needed for allergies.    Yes Historical Provider, MD  fluticasone (FLONASE) 50 MCG/ACT nasal spray Place 2 sprays into both nostrils as needed for allergies or rhinitis.   Yes Historical Provider, MD  losartan-hydrochlorothiazide (HYZAAR) 100-25 MG per tablet Take 1 tablet by mouth daily.   Yes Historical Provider, MD  Multiple Vitamins-Minerals (MULTIVITAMIN MEN PO) Take 1 capsule by mouth daily.   Yes Historical Provider, MD  OVER THE COUNTER MEDICATION Take 1 capsule by mouth 3 (three) times daily. Hard burn 775 mg capsule (fat burner) by mouth three times daily   Yes Historical Provider, MD  OVER THE COUNTER MEDICATION Take 2 capsules by mouth 2 (two) times daily. Omega Shred take 2 capsules by mouth twice daily. (lean support)   Yes Historical Provider, MD  pravastatin (PRAVACHOL) 20 MG tablet Take 20 mg by mouth daily.   Yes Historical Provider, MD   BP 124/89 mmHg  Pulse 86  Temp(Src) 98 F (36.7 C) (Oral)  Resp 13  Ht 5\' 11"  (1.803 m)  Wt 118.871 kg  BMI 36.57 kg/m2  SpO2 99% Physical Exam  Constitutional: He is oriented to person, place, and time. Vital signs are normal. He appears well-developed and well-nourished.  Non-toxic appearance. No distress.  Afebrile, nontoxic, NAD  HENT:  Head: Normocephalic and atraumatic.  Mouth/Throat: Oropharynx is clear and moist and mucous membranes are normal.  Eyes: Conjunctivae and EOM are normal. Right eye exhibits no discharge. Left eye exhibits no discharge.  Neck: Normal range of motion. Neck supple.  Cardiovascular: Normal rate, regular rhythm, normal heart sounds and intact distal pulses.  Exam reveals no gallop and no friction rub.   No  murmur heard. RRR, nl s1/s2, no m/r/g, distal pulses intact, no pedal edema   Pulmonary/Chest: Effort normal and breath sounds normal. No respiratory distress. He has no decreased breath sounds. He has no wheezes. He has no rhonchi. He has no rales.  Abdominal: Soft. Normal appearance and bowel sounds are normal. He exhibits no distension. There is no tenderness. There is no rigidity, no rebound, no guarding, no CVA tenderness, no tenderness at McBurney's point and negative Murphy's sign.  Musculoskeletal: Normal range of motion.  MAE x4 Strength and sensation grossly intact Distal pulses intact No pedal edema  Neurological: He is alert and oriented to person, place, and time. He has normal strength. No sensory deficit.  Skin: Skin is warm, dry and intact. No rash noted.  Psychiatric: He has a normal mood and affect.  Nursing note and vitals reviewed.   ED Course  Procedures (including critical care time)  CRITICAL CARE-NSTEMI Performed by: Ramond Marrowamprubi-Soms, Angelique Chevalier Strupp   Total critical care  time: 45 minutes  Critical care time was exclusive of separately billable procedures and treating other patients.  Critical care was necessary to treat or prevent imminent or life-threatening deterioration.  Critical care was time spent personally by me on the following activities: development of treatment plan with patient and/or surrogate as well as nursing, discussions with consultants, evaluation of patient's response to treatment, examination of patient, obtaining history from patient or surrogate, ordering and performing treatments and interventions, ordering and review of laboratory studies, ordering and review of radiographic studies, pulse oximetry and re-evaluation of patient's condition.  Labs Review Labs Reviewed  BASIC METABOLIC PANEL - Abnormal; Notable for the following:    Potassium 3.4 (*)    Glucose, Bld 105 (*)    All other components within normal limits  CBC - Abnormal;  Notable for the following:    Hemoglobin 12.8 (*)    All other components within normal limits  TROPONIN I - Abnormal; Notable for the following:    Troponin I 0.11 (*)    All other components within normal limits  CBC  CREATININE, SERUM  TSH  TROPONIN I  TROPONIN I  I-STAT TROPOININ, ED    Imaging Review Dg Chest 2 View  09/14/2015  CLINICAL DATA:  Left-sided chest pain for a week. EXAM: CHEST  2 VIEW COMPARISON:  None. FINDINGS: The heart, hila, mediastinum, lungs, and pleura are normal. There is erosion of the distal right clavicle which is nonspecific but could be from previous trauma. No other acute abnormalities. IMPRESSION: No acute abnormalities seen within the chest. Erosion of the distal right clavicle is probably not acute and could be from previous trauma. Recommend clinical correlation. Electronically Signed   By: Gerome Sam III M.D   On: 09/14/2015 08:10   I have personally reviewed and evaluated these images and lab results as part of my medical decision-making.   EKG Interpretation   Date/Time:  Saturday September 14 2015 06:19:47 EST Ventricular Rate:  95 PR Interval:  146 QRS Duration: 72 QT Interval:  344 QTC Calculation: 432 R Axis:   42 Text Interpretation:  Normal sinus rhythm Normal ECG No previous ECGs  available Confirmed by LITTLE MD, RACHEL ([id number]) on 09/14/2015 8:49:15 AM      MDM   Final diagnoses:  Chest pain of uncertain etiology  Hypokalemia  NSTEMI (non-ST elevated myocardial infarction) [facility])    55 y.o. male here with L sided CP x2 wks intermittently, worsening this morning at 3am while at rest. Worsens with exertion. +Cardiac RFs including +FHx and HTN/HLD. Concerning story. EKG with TWI in LL3. Trop neg, but given that symptoms started at 3am, this does not rule him out. He's had an exercise tolerance test in 2015 but I'm unable to pull up the results. No echo on file. Sees Dr. Mayford Knife at [facility]. He took  ASA this morning, will give the  rest of it. Likely admit for ACS r/o. Will await CXR and remaining labs. No tachycardia or hypoxia, no RFs for PE, doubt this as an etiology. Currently CP free, will hold on Morphine and NTG. Will reassess shortly.   9:03 AM Still no ongoing CP. CXR unremarkable for acute findings, although shows R clavicle erosion which could be from prior trauma. BMP with mildly low K at 3.4, will replete. CBC unremarkable. I was able to pull up his exercise tolerance test, which was negative at the time. Overall, I still feel he would warrant ACS r/o admission given his HEART score of  4-5, potentially get Echo and/or stress test while he's in the hospital. Pt agrees with plan, I discussed it with my attending Dr. Clarene Duke who also agrees with plan. Will proceed with admission.   9:11 AM Dr. Conley Rolls of [facility] returning page, will admit. He asked that I not place holding orders, he will come see pt shortly. Please see his notes for further documentation of care. Pt stable at this time.  BP 122/85 mmHg  Pulse 71  Temp(Src) 98 F (36.7 C) (Oral)  Resp 14  Ht 5\' 11"  (1.803 m)  Wt 118.871 kg  BMI 36.57 kg/m2  SpO2 96%  Meds ordered this encounter  Medications  . aspirin chewable tablet 243 mg    Sig:   . potassium chloride SA (K-DUR,KLOR-CON) CR tablet 20 mEq    Sig:      Nikan Ellingson Camprubi-Soms, PA-C 09/14/15 0912  ADDENDUM 1:40PM: I was reviewing labs while pt was still down here, although he's admitted by Dr. Conley Rolls, and I noticed that his second troponin was 0.11 which resulted at 12:55pm. I contacted Dr. Conley Rolls and he stated we should proceed with heparinizing pt and call cardiology now. I will assist with this now. Heparin bolus ordered, and pharmacy consult for heparin placed. Will await call back from cardiology.  1:55 PM Dr. Herbie Baltimore of Cardiology returning page, aware of pt and will consult. Did not mention whether he would take him to cardiac cath or not, please see his notes for further documentation of care.  Pt stable at this time.    France Ravens Camprubi-Soms, PA-C 09/14/15 1405  Laurence Spates, MD 09/15/15 [phone]

## 2015-09-15 ENCOUNTER — Encounter (HOSPITAL_COMMUNITY): Payer: Self-pay | Admitting: Cardiology

## 2015-09-15 DIAGNOSIS — E7800 Pure hypercholesterolemia, unspecified: Secondary | ICD-10-CM

## 2015-09-15 DIAGNOSIS — E876 Hypokalemia: Secondary | ICD-10-CM | POA: Diagnosis present

## 2015-09-15 DIAGNOSIS — I214 Non-ST elevation (NSTEMI) myocardial infarction: Secondary | ICD-10-CM | POA: Diagnosis present

## 2015-09-15 DIAGNOSIS — Z79899 Other long term (current) drug therapy: Secondary | ICD-10-CM | POA: Diagnosis not present

## 2015-09-15 DIAGNOSIS — R079 Chest pain, unspecified: Secondary | ICD-10-CM | POA: Diagnosis not present

## 2015-09-15 DIAGNOSIS — J45909 Unspecified asthma, uncomplicated: Secondary | ICD-10-CM | POA: Diagnosis present

## 2015-09-15 DIAGNOSIS — Z888 Allergy status to other drugs, medicaments and biological substances status: Secondary | ICD-10-CM | POA: Diagnosis not present

## 2015-09-15 DIAGNOSIS — I10 Essential (primary) hypertension: Secondary | ICD-10-CM | POA: Diagnosis present

## 2015-09-15 DIAGNOSIS — R0789 Other chest pain: Secondary | ICD-10-CM | POA: Diagnosis not present

## 2015-09-15 DIAGNOSIS — I2510 Atherosclerotic heart disease of native coronary artery without angina pectoris: Secondary | ICD-10-CM | POA: Diagnosis not present

## 2015-09-15 DIAGNOSIS — Z825 Family history of asthma and other chronic lower respiratory diseases: Secondary | ICD-10-CM | POA: Diagnosis not present

## 2015-09-15 DIAGNOSIS — E785 Hyperlipidemia, unspecified: Secondary | ICD-10-CM | POA: Diagnosis present

## 2015-09-15 DIAGNOSIS — Z8249 Family history of ischemic heart disease and other diseases of the circulatory system: Secondary | ICD-10-CM | POA: Diagnosis not present

## 2015-09-15 DIAGNOSIS — G4733 Obstructive sleep apnea (adult) (pediatric): Secondary | ICD-10-CM | POA: Diagnosis present

## 2015-09-15 DIAGNOSIS — Z7982 Long term (current) use of aspirin: Secondary | ICD-10-CM | POA: Diagnosis not present

## 2015-09-15 DIAGNOSIS — Z6836 Body mass index (BMI) 36.0-36.9, adult: Secondary | ICD-10-CM | POA: Diagnosis not present

## 2015-09-15 DIAGNOSIS — E669 Obesity, unspecified: Secondary | ICD-10-CM | POA: Diagnosis present

## 2015-09-15 HISTORY — DX: Atherosclerotic heart disease of native coronary artery without angina pectoris: I25.10

## 2015-09-15 LAB — CBC
HEMATOCRIT: 38.6 % — AB (ref 39.0–52.0)
HEMOGLOBIN: 12.7 g/dL — AB (ref 13.0–17.0)
MCH: 27.9 pg (ref 26.0–34.0)
MCHC: 32.9 g/dL (ref 30.0–36.0)
MCV: 84.8 fL (ref 78.0–100.0)
Platelets: 174 10*3/uL (ref 150–400)
RBC: 4.55 MIL/uL (ref 4.22–5.81)
RDW: 13.3 % (ref 11.5–15.5)
WBC: 6 10*3/uL (ref 4.0–10.5)

## 2015-09-15 LAB — LIPID PANEL
CHOL/HDL RATIO: 4.7 ratio
Cholesterol: 169 mg/dL (ref 0–200)
HDL: 36 mg/dL — AB (ref >40)
LDL CALC: 101 mg/dL — AB (ref 0–99)
TRIGLYCERIDES: 161 mg/dL — AB (ref <150)
VLDL: 32 mg/dL (ref 0–40)

## 2015-09-15 LAB — HEPARIN LEVEL (UNFRACTIONATED): Heparin Unfractionated: 0.4 IU/mL (ref 0.30–0.70)

## 2015-09-15 LAB — TROPONIN I: Troponin I: 0.29 ng/mL — ABNORMAL HIGH (ref <0.031)

## 2015-09-15 MED ORDER — SODIUM CHLORIDE 0.9% FLUSH
3.0000 mL | Freq: Two times a day (BID) | INTRAVENOUS | Status: DC
  Administered 2015-09-15: 3 mL via INTRAVENOUS

## 2015-09-15 MED ORDER — SODIUM CHLORIDE 0.9 % WEIGHT BASED INFUSION
3.0000 mL/kg/h | INTRAVENOUS | Status: DC
  Administered 2015-09-16: 3 mL/kg/h via INTRAVENOUS

## 2015-09-15 MED ORDER — SODIUM CHLORIDE 0.9 % WEIGHT BASED INFUSION: 1.0000 mL/kg/h | INTRAVENOUS | Status: DC

## 2015-09-15 MED ORDER — ASPIRIN 81 MG PO CHEW
81.0000 mg | CHEWABLE_TABLET | ORAL | Status: AC
  Administered 2015-09-16: 81 mg via ORAL
  Filled 2015-09-15: qty 1

## 2015-09-15 MED ORDER — SODIUM CHLORIDE 0.9 % IV SOLN
250.0000 mL | INTRAVENOUS | Status: DC | PRN
Start: 2015-09-15 — End: 2015-09-16

## 2015-09-15 MED ORDER — SODIUM CHLORIDE 0.9% FLUSH: 3.0000 mL | INTRAVENOUS | Status: DC | PRN

## 2015-09-15 MED ORDER — PANTOPRAZOLE SODIUM 40 MG PO TBEC
40.0000 mg | DELAYED_RELEASE_TABLET | Freq: Two times a day (BID) | ORAL | Status: DC
  Administered 2015-09-15 – 2015-09-17 (×4): 40 mg via ORAL
  Filled 2015-09-15 (×4): qty 1

## 2015-09-15 NOTE — Progress Notes (Signed)
ANTICOAGULATION CONSULT NOTE - Initial Consult  Pharmacy Consult for heparin Indication: ACS/STEMI  Allergies  Allergen Reactions  . Crestor [Rosuvastatin]     Muscle aches  . Lipitor [Atorvastatin]     Patient Measurements: Height: 5\' 11"  (180.3 cm) Weight: 261 lb (118.389 kg) IBW/kg (Calculated) : 75.3 Heparin Dosing Weight: 102 kg  Vital Signs: Temp: 98.2 F (36.8 C) (03/12 0400) BP: 107/72 mmHg (03/12 0915) Pulse Rate: 77 (03/12 0915)  Labs:  Recent Labs  09/14/15 0645 09/14/15 1158 09/14/15 1814 09/14/15 2016 09/14/15 2341 09/15/15 0631  HGB 14.1 12.8*  --   --   --  12.7*  HCT 41.6 39.3  --   --   --  38.6*  PLT 178 159  --   --   --  174  HEPARINUNFRC  --   --   --  0.50  --  0.40  CREATININE 1.15 1.14  --   --   --   --   TROPONINI  --  0.11* 0.26*  --  0.29*  --     Estimated Creatinine Clearance: 99.2 mL/min (by C-G formula based on Cr of 1.14).   Medical History: Past Medical History  Diagnosis Date  . Hypertension   . Hyperlipidemia   . Obstructive sleep apnea on CPAP   . Allergy   . Allergic rhinitis     /asthma  . Asthma   . Hypercholesteremia   . ED (erectile dysfunction)   . Osteoarthritis     OA knees, shoulders; DDD lumbar spine  . NSTEMI (non-ST elevated myocardial infarction) (HCC) 09/15/2015    Assessment: 55 y/o man admitted 09/14/2015 with chest pain with troponin rising to 0.29. Pharmacy consulted to dose heparin for NSTEMI.   PMH HTN, HLD, OSA on CPAP  HL therapeutic at 0.4 on heparin 1300 units/h. Hgb 12.7 stable, plt wnl. No noted bleeding.    Goal of Therapy:  Heparin level 0.3-0.7 units/ml Monitor platelets by anticoagulation protocol: Yes   Plan:  Continue heparin 1300 units/hr Daily HL, CBC Monitor s/sx bleeding LHC Monday   Hillery AldoElizabeth Lunna Vogelgesang, Vermont[place]arm.D., BCPS PGY2 Cardiology Pharmacy Resident Pager: [phone]  09/15/2015,10:05 AM

## 2015-09-15 NOTE — Progress Notes (Signed)
SUBJECTIVE:  No further CP since admission  OBJECTIVE:   Vitals:   Filed Vitals:   09/14/15 1430 09/14/15 1517 09/14/15 2100 09/15/15 0400  BP: 112/80 114/74 110/81 90/60  Pulse: 69  70 69  Temp:  97.7 F (36.5 C) 98.6 F (37 C) 98.2 F (36.8 C)  TempSrc:  Oral    Resp: 16 16 20 21   Height:  5\' 11"  (1.803 m)    Weight:  262 lb (118.842 kg)  261 lb (118.389 kg)  SpO2: 95% 100% 98% 98%   I&O's:   Intake/Output Summary (Last 24 hours) at 09/15/15 [id number] Last data filed at 09/15/15 0500  Gross per 24 hour  Intake    597 ml  Output      0 ml  Net    597 ml   TELEMETRY: Reviewed telemetry pt in NSR:     PHYSICAL EXAM General: Well developed, well nourished, in no acute distress Head: Eyes PERRLA, No xanthomas.   Normal cephalic and atramatic  Lungs:   Clear bilaterally to auscultation and percussion. Heart:   HRRR S1 S2 Pulses are 2+ & equal. Abdomen: Bowel sounds are positive, abdomen soft and non-tender without masses  Extremities:   No clubbing, cyanosis or edema.  DP +1 Neuro: Alert and oriented X 3. Psych:  Good affect, responds appropriately   LABS: Basic Metabolic Panel:  Recent Labs  96/10/5401/11/17 0645 09/14/15 1158  NA 140  --   K 3.4*  --   CL 105  --   CO2 27  --   GLUCOSE 105*  --   BUN 17  --   CREATININE 1.15 1.14  CALCIUM 9.3  --    Liver Function Tests: No results for input(s): AST, ALT, ALKPHOS, BILITOT, PROT, ALBUMIN in the last 72 hours. No results for input(s): LIPASE, AMYLASE in the last 72 hours. CBC:  Recent Labs  09/14/15 1158 09/15/15 0631  WBC 5.3 6.0  HGB 12.8* 12.7*  HCT 39.3 38.6*  MCV 84.7 84.8  PLT 159 174   Cardiac Enzymes:  Recent Labs  09/14/15 1158 09/14/15 1814 09/14/15 2341  TROPONINI 0.11* 0.26* 0.29*   BNP: Invalid input(s): POCBNP D-Dimer: No results for input(s): DDIMER in the last 72 hours. Hemoglobin A1C: No results for input(s): HGBA1C in the last 72 hours. Fasting Lipid Panel: No results for  input(s): CHOL, HDL, LDLCALC, TRIG, CHOLHDL, LDLDIRECT in the last 72 hours. Thyroid Function Tests:  Recent Labs  09/14/15 1158  TSH 1.156   Anemia Panel: No results for input(s): VITAMINB12, FOLATE, FERRITIN, TIBC, IRON, RETICCTPCT in the last 72 hours. Coag Panel:   Lab Results  Component Value Date   INR 0.95 07/04/2009    RADIOLOGY: Dg Chest 2 View  09/14/2015  CLINICAL DATA:  Left-sided chest pain for a week. EXAM: CHEST  2 VIEW COMPARISON:  None. FINDINGS: The heart, hila, mediastinum, lungs, and pleura are normal. There is erosion of the distal right clavicle which is nonspecific but could be from previous trauma. No other acute abnormalities. IMPRESSION: No acute abnormalities seen within the chest. Erosion of the distal right clavicle is probably not acute and could be from previous trauma. Recommend clinical correlation. Electronically Signed   By: Gerome Samavid  Williams III M.D   On: 09/14/2015 08:10      ASSESSMENT/PLAN:   1.  NSTEMI with progressive exertional CP relieved with SL NTG.  EKG with no ST changes.  Check 2D echo to assess LVF.  Will make NPO  tonight for cath tomorrow.  Cardiac catheterization was discussed with the patient fully. The patient understands that risks include but are not limited to stroke (1 in 1000), death (1 in 1000), kidney failure [usually temporary] (1 in 500), bleeding (1 in 200), allergic reaction [possibly serious] (1 in 200).  The patient understands and is willing to proceed.  Continue ASA/IV Heparin gtt/BB/NTG paste/statin.  Hold diuretic until after cath.   2.  OSA 3.  HTN controlled on BB and ARB.   4.  Dyslipidemia - continue statin and check FLP   Quintella Reichert, MD  09/15/2015  8:07 AM

## 2015-09-15 NOTE — Progress Notes (Signed)
PROGRESS NOTE  Raymond Jordan ZOX:[id number] DOB: July 31, 1960 DOA: 09/14/2015 PCP: Hollice Espy, MD Outpatient Specialists:      Brief Narrative: 55 y.o. 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   Assessment & Plan: Principal Problem:   Atypical chest pain Active Problems:   Essential hypertension, benign   Pure hypercholesterolemia   OSA on CPAP   Obesity   NSTEMI (non-ST elevated myocardial infarction) [facility])   Chest pain / NSTEMI - cardiology consulted, appreciate input - plan for cardiac catheterization on Monday  - 2D echo pending  OSA - continue CPAP  HTN - controlled, continue current regimen  Obesity - diet education as outpatient   HLD - on statin   DVT prophylaxis: heparin gtt Code Status: Full Family Communication: d/w wife bedside Disposition Plan: home when ready  Barriers for discharge: cath   Consultants:   Cardiology   Procedures:   2D echo: pending  Antimicrobials:  None    Subjective: - no chest pain or shortness of breath this morning.   Objective: Filed Vitals:   09/14/15 1517 09/14/15 2100 09/15/15 0400 09/15/15 0915  BP: 114/74 110/81 90/60 107/72  Pulse:  70 69 77  Temp: 97.7 F (36.5 C) 98.6 F (37 C) 98.2 F (36.8 C)   TempSrc: Oral     Resp: Height:  (1.803 m)     Weight: 118.842 kg (262 lb)  118.389 kg (261 lb)   SpO2: 100% 98% 98% 99%    Intake/Output Summary (Last 24 hours) at 09/15/15 1147 Last data filed at 09/15/15 0915  Gross per 24 hour  Intake    837 ml  Output      0 ml  Net    837 ml   Filed Weights   09/14/15 0621 09/14/15 1517 09/15/15 0400  Weight: 118.871 kg (262 lb 1 oz) 118.842 kg (262 lb) 118.389 kg (261 lb)    Examination: BP 107/72 mmHg  Pulse 77  Temp(Src) 98.2 F (36.8 C) (Oral)  Resp 16  Ht   (1.803 m)  Wt 118.389 kg (261 lb)  BMI 36.42 kg/m2  SpO2 99%  GENERAL: NAD  HEENT: head NCAT, no scleral icterus.  NECK: Supple. No carotid bruits. No lymphadenopathy or thyromegaly.  LUNGS: Clear to auscultation. No wheezing or crackles  HEART: Regular rate and rhythm without murmur. 2+ pulses, no JVD, no peripheral edema  ABDOMEN: Soft, nontender, and nondistended. Positive bowel sounds.   NEUROLOGIC: non focal   Data Reviewed: I have personally reviewed following labs and imaging studies  CBC:  Recent Labs Lab 09/14/15 0645 09/14/15 1158 09/15/15 0631  WBC 6.0 5.3 6.0  HGB 14.1 12.8* 12.7*  HCT 41.6 39.3 38.6*  MCV 84.4 84.7 84.8  PLT 178 159 174   Basic Metabolic Panel:  Recent Labs Lab 09/14/15 0645 09/14/15 1158  NA 140  --   K 3.4*  --   CL 105  --   CO2 27  --   GLUCOSE 105*  --   BUN 17  --   CREATININE 1.15 1.14  CALCIUM 9.3  --    GFR: Estimated Creatinine Clearance: 99.2 mL/min (by C-G formula based on Cr of 1.14). Liver Function Tests: No results for input(s): AST, ALT, ALKPHOS, BILITOT, PROT, ALBUMIN in the last 168 hours. No results for  input(s): LIPASE, AMYLASE in the last 168 hours. No results for input(s): AMMONIA in the last 168 hours. Coagulation Profile: No results for input(s): INR, PROTIME in the last 168 hours. Cardiac Enzymes:  Recent Labs Lab 09/14/15 1158 09/14/15 1814 09/14/15 2341  TROPONINI 0.11* 0.26* 0.29*   BNP (last 3 results) No results for input(s): PROBNP in the last 8760 hours. HbA1C: No results for input(s): HGBA1C in the last 72 hours. CBG: No results for input(s): GLUCAP in the last 168 hours. Lipid Profile:  Recent Labs  09/15/15 0631  CHOL 169  HDL 36*  LDLCALC 101*  TRIG 161*  CHOLHDL 4.7   Thyroid Function Tests:  Recent Labs  09/14/15 1158  TSH 1.156   Anemia Panel: No results for input(s): VITAMINB12, FOLATE, FERRITIN, TIBC, IRON, RETICCTPCT in the last 72 hours. Urine  analysis: No results found for: COLORURINE, APPEARANCEUR, LABSPEC, PHURINE, GLUCOSEU, HGBUR, BILIRUBINUR, KETONESUR, PROTEINUR, UROBILINOGEN, NITRITE, LEUKOCYTESUR Sepsis Labs: Invalid input(s): PROCALCITONIN, LACTICIDVEN  No results found for this or any previous visit (from the past 240 hour(s)).    Radiology Studies: Dg Chest 2 View  09/14/2015  CLINICAL DATA:  Left-sided chest pain for a week. EXAM: CHEST  2 VIEW COMPARISON:  None. FINDINGS: The heart, hila, mediastinum, lungs, and pleura are normal. There is erosion of the distal right clavicle which is nonspecific but could be from previous trauma. No other acute abnormalities. IMPRESSION: No acute abnormalities seen within the chest. Erosion of the distal right clavicle is probably not acute and could be from previous trauma. Recommend clinical correlation. Electronically Signed   By: Gerome Samavid  Williams III M.D   On: 09/14/2015 08:10     Scheduled Meds: . aspirin  81 mg Oral Once  . losartan  100 mg Oral Daily  . metoprolol tartrate  25 mg Oral BID  . nitroGLYCERIN  1 inch Topical 3 times per day  . pantoprazole  40 mg Oral BID AC  . pravastatin  20 mg Oral Daily  . sodium chloride flush  3 mL Intravenous Q12H   Continuous Infusions: . heparin 1,300 Units/hr (09/15/15 0915)      Pamella Pertostin Mariavictoria Nottingham, MD, PhD [facility] Pager [phone]  If 7PM-7AM, please contact night-coverage [email or web address] Password [email or web address] 09/15/2015, 11:47 AM

## 2015-09-16 ENCOUNTER — Encounter (HOSPITAL_COMMUNITY): Payer: Self-pay | Admitting: Cardiology

## 2015-09-16 ENCOUNTER — Inpatient Hospital Stay (HOSPITAL_COMMUNITY): Payer: 59

## 2015-09-16 ENCOUNTER — Other Ambulatory Visit: Payer: Self-pay

## 2015-09-16 ENCOUNTER — Encounter (HOSPITAL_COMMUNITY)
Admission: EM | Disposition: A | Discharge status: Home / Self Care | Payer: Self-pay | Source: Home / Self Care | Attending: Internal Medicine

## 2015-09-16 DIAGNOSIS — R079 Chest pain, unspecified: Secondary | ICD-10-CM

## 2015-09-16 DIAGNOSIS — I2510 Atherosclerotic heart disease of native coronary artery without angina pectoris: Secondary | ICD-10-CM

## 2015-09-16 HISTORY — PX: CARDIAC CATHETERIZATION: SHX172

## 2015-09-16 LAB — LIPID PANEL
CHOLESTEROL: 142 mg/dL (ref 0–200)
HDL: 33 mg/dL — AB (ref >40)
LDL Cholesterol: 85 mg/dL (ref 0–99)
TRIGLYCERIDES: 120 mg/dL (ref <150)
Total CHOL/HDL Ratio: 4.3 RATIO
VLDL: 24 mg/dL (ref 0–40)

## 2015-09-16 LAB — BASIC METABOLIC PANEL
ANION GAP: 8 (ref 5–15)
BUN: 17 mg/dL (ref 6–20)
CHLORIDE: 106 mmol/L (ref 101–111)
CO2: 26 mmol/L (ref 22–32)
CREATININE: 1.09 mg/dL (ref 0.61–1.24)
Calcium: 9.1 mg/dL (ref 8.9–10.3)
GFR calc non Af Amer: >60 mL/min (ref >60)
Glucose, Bld: 105 mg/dL — ABNORMAL HIGH (ref 65–99)
Potassium: 3.7 mmol/L (ref 3.5–5.1)
SODIUM: 140 mmol/L (ref 135–145)

## 2015-09-16 LAB — CBC
HEMATOCRIT: 36.7 % — AB (ref 39.0–52.0)
HEMOGLOBIN: 11.8 g/dL — AB (ref 13.0–17.0)
MCH: 27.3 pg (ref 26.0–34.0)
MCHC: 32.2 g/dL (ref 30.0–36.0)
MCV: 84.8 fL (ref 78.0–100.0)
Platelets: 154 10*3/uL (ref 150–400)
RBC: 4.33 MIL/uL (ref 4.22–5.81)
RDW: 13.1 % (ref 11.5–15.5)
WBC: 5.9 10*3/uL (ref 4.0–10.5)

## 2015-09-16 LAB — PROTIME-INR
INR: 1.19 (ref 0.00–1.49)
Prothrombin Time: 15.3 seconds — ABNORMAL HIGH (ref 11.6–15.2)

## 2015-09-16 LAB — HEPARIN LEVEL (UNFRACTIONATED): Heparin Unfractionated: 0.57 IU/mL (ref 0.30–0.70)

## 2015-09-16 LAB — ECHOCARDIOGRAM COMPLETE
HEIGHTINCHES: 71 in
WEIGHTICAEL: 4176 [oz_av]

## 2015-09-16 LAB — POCT ACTIVATED CLOTTING TIME
ACTIVATED CLOTTING TIME: 250 s
Activated Clotting Time: 394 seconds

## 2015-09-16 SURGERY — LEFT HEART CATH AND CORONARY ANGIOGRAPHY
Anesthesia: LOCAL

## 2015-09-16 MED ORDER — FENTANYL CITRATE (PF) 100 MCG/2ML IJ SOLN
INTRAMUSCULAR | Status: AC
  Filled 2015-09-16: qty 2

## 2015-09-16 MED ORDER — NITROGLYCERIN 1 MG/10 ML FOR IR/CATH LAB
INTRA_ARTERIAL | Status: DC | PRN
  Administered 2015-09-16: 12:00:00

## 2015-09-16 MED ORDER — TICAGRELOR 90 MG PO TABS
ORAL_TABLET | ORAL | Status: DC | PRN
  Administered 2015-09-16: 180 mg via ORAL

## 2015-09-16 MED ORDER — VERAPAMIL HCL 2.5 MG/ML IV SOLN
INTRAVENOUS | Status: AC
  Filled 2015-09-16: qty 2

## 2015-09-16 MED ORDER — HEPARIN (PORCINE) IN NACL 2-0.9 UNIT/ML-% IJ SOLN
INTRAMUSCULAR | Status: AC
  Filled 2015-09-16: qty 1500

## 2015-09-16 MED ORDER — LIDOCAINE HCL (PF) 1 % IJ SOLN
INTRAMUSCULAR | Status: AC
  Filled 2015-09-16: qty 30

## 2015-09-16 MED ORDER — MIDAZOLAM HCL 2 MG/2ML IJ SOLN
INTRAMUSCULAR | Status: DC | PRN
  Administered 2015-09-16: 1 mg via INTRAVENOUS
  Administered 2015-09-16: 2 mg via INTRAVENOUS
  Administered 2015-09-16: 1 mg via INTRAVENOUS

## 2015-09-16 MED ORDER — MIDAZOLAM HCL 2 MG/2ML IJ SOLN
INTRAMUSCULAR | Status: AC
  Filled 2015-09-16: qty 2

## 2015-09-16 MED ORDER — VERAPAMIL HCL 2.5 MG/ML IV SOLN
INTRAVENOUS | Status: DC | PRN
  Administered 2015-09-16: 11:00:00 via INTRA_ARTERIAL

## 2015-09-16 MED ORDER — SODIUM CHLORIDE 0.9% FLUSH
3.0000 mL | Freq: Two times a day (BID) | INTRAVENOUS | Status: DC
  Administered 2015-09-17: 3 mL via INTRAVENOUS

## 2015-09-16 MED ORDER — SODIUM CHLORIDE 0.9% FLUSH: 3.0000 mL | INTRAVENOUS | Status: DC | PRN

## 2015-09-16 MED ORDER — SODIUM CHLORIDE 0.9 % IV SOLN
INTRAVENOUS | Status: AC
  Administered 2015-09-16: 13:00:00 via INTRAVENOUS

## 2015-09-16 MED ORDER — SODIUM CHLORIDE 0.9 % IV SOLN: 250.0000 mL | INTRAVENOUS | Status: DC | PRN

## 2015-09-16 MED ORDER — LIDOCAINE HCL (PF) 1 % IJ SOLN
INTRAMUSCULAR | Status: DC | PRN
  Administered 2015-09-16: 5 mL via INTRADERMAL

## 2015-09-16 MED ORDER — ANGIOPLASTY BOOK
Freq: Once | Status: DC
  Filled 2015-09-16: qty 1

## 2015-09-16 MED ORDER — HEPARIN SODIUM (PORCINE) 1000 UNIT/ML IJ SOLN
INTRAMUSCULAR | Status: AC
  Filled 2015-09-16: qty 1

## 2015-09-16 MED ORDER — HEART ATTACK BOUNCING BOOK
Freq: Once | Status: AC
  Administered 2015-09-16: 22:00:00
  Filled 2015-09-16: qty 1

## 2015-09-16 MED ORDER — TICAGRELOR 90 MG PO TABS
90.0000 mg | ORAL_TABLET | Freq: Two times a day (BID) | ORAL | Status: DC
  Administered 2015-09-16 – 2015-09-17 (×2): 90 mg via ORAL
  Filled 2015-09-16 (×2): qty 1

## 2015-09-16 MED ORDER — TICAGRELOR 90 MG PO TABS
ORAL_TABLET | ORAL | Status: AC
  Filled 2015-09-16: qty 2

## 2015-09-16 MED ORDER — FENTANYL CITRATE (PF) 100 MCG/2ML IJ SOLN
INTRAMUSCULAR | Status: DC | PRN
Start: 2015-09-16 — End: 2015-09-16
  Administered 2015-09-16: 50 ug via INTRAVENOUS
  Administered 2015-09-16 (×3): 25 ug via INTRAVENOUS

## 2015-09-16 MED ORDER — HEPARIN SODIUM (PORCINE) 1000 UNIT/ML IJ SOLN
INTRAMUSCULAR | Status: DC | PRN
  Administered 2015-09-16 (×2): 4000 [IU] via INTRAVENOUS
  Administered 2015-09-16: 6000 [IU] via INTRAVENOUS

## 2015-09-16 MED ORDER — IOHEXOL 350 MG/ML SOLN
INTRAVENOUS | Status: DC | PRN
  Administered 2015-09-16: 135 mL via INTRA_ARTERIAL

## 2015-09-16 MED ORDER — MIDAZOLAM HCL 2 MG/2ML IJ SOLN
INTRAMUSCULAR | Status: AC
Start: 2015-09-16 — End: 2015-09-16
  Filled 2015-09-16: qty 2

## 2015-09-16 SURGICAL SUPPLY — 19 items
BALLN EMERGE MR 2.5X12 (BALLOONS) ×2
BALLN ~~LOC~~ EMERGE MR 4.5X8 (BALLOONS) ×2
BALLOON EMERGE MR 2.5X12 (BALLOONS) ×1 IMPLANT
BALLOON ~~LOC~~ EMERGE MR 4.5X8 (BALLOONS) ×1 IMPLANT
CATH INFINITI 5 FR JL3.5 (CATHETERS) ×2 IMPLANT
CATH INFINITI 5FR ANG PIGTAIL (CATHETERS) ×2 IMPLANT
CATH INFINITI JR4 5F (CATHETERS) ×2 IMPLANT
DEVICE RAD COMP TR BAND LRG (VASCULAR PRODUCTS) ×2 IMPLANT
GLIDESHEATH SLEND SS 6F .021 (SHEATH) ×2 IMPLANT
GUIDE CATH RUNWAY 6FR AL 1 (CATHETERS) ×2 IMPLANT
KIT ENCORE 26 ADVANTAGE (KITS) ×2 IMPLANT
KIT HEART LEFT (KITS) ×2 IMPLANT
PACK CARDIAC CATHETERIZATION (CUSTOM PROCEDURE TRAY) ×2 IMPLANT
STENT PROMUS PREM MR 4.0X16 (Permanent Stent) ×2 IMPLANT
SYR MEDRAD MARK V 150ML (SYRINGE) ×2 IMPLANT
TRANSDUCER W/STOPCOCK (MISCELLANEOUS) ×2 IMPLANT
TUBING CIL FLEX 10 FLL-RA (TUBING) ×2 IMPLANT
WIRE COUGAR XT STRL 190CM (WIRE) ×2 IMPLANT
WIRE SAFE-T 1.5MM-J .035X260CM (WIRE) ×2 IMPLANT

## 2015-09-16 NOTE — H&P (View-Only) (Signed)
 SUBJECTIVE:  No further CP since admission  OBJECTIVE:   Vitals:   Filed Vitals:   09/14/15 1430 09/14/15 1517 09/14/15 2100 09/15/15 0400  BP: 112/80 114/74 110/81 90/60  Pulse: 69  70 69  Temp:  97.7 F (36.5 C) 98.6 F (37 C) 98.2 F (36.8 C)  TempSrc:  Oral    Resp: 16 16 20 21  Height:  5' 11" (1.803 m)    Weight:  262 lb (118.842 kg)  261 lb (118.389 kg)  SpO2: 95% 100% 98% 98%   I&O's:   Intake/Output Summary (Last 24 hours) at 09/15/15 0807 Last data filed at 09/15/15 0500  Gross per 24 hour  Intake    597 ml  Output      0 ml  Net    597 ml   TELEMETRY: Reviewed telemetry pt in NSR:     PHYSICAL EXAM General: Well developed, well nourished, in no acute distress Head: Eyes PERRLA, No xanthomas.   Normal cephalic and atramatic  Lungs:   Clear bilaterally to auscultation and percussion. Heart:   HRRR S1 S2 Pulses are 2+ & equal. Abdomen: Bowel sounds are positive, abdomen soft and non-tender without masses  Extremities:   No clubbing, cyanosis or edema.  DP +1 Neuro: Alert and oriented X 3. Psych:  Good affect, responds appropriately   LABS: Basic Metabolic Panel:  Recent Labs  09/14/15 0645 09/14/15 1158  NA 140  --   K 3.4*  --   CL 105  --   CO2 27  --   GLUCOSE 105*  --   BUN 17  --   CREATININE 1.15 1.14  CALCIUM 9.3  --    Liver Function Tests: No results for input(s): AST, ALT, ALKPHOS, BILITOT, PROT, ALBUMIN in the last 72 hours. No results for input(s): LIPASE, AMYLASE in the last 72 hours. CBC:  Recent Labs  09/14/15 1158 09/15/15 0631  WBC 5.3 6.0  HGB 12.8* 12.7*  HCT 39.3 38.6*  MCV 84.7 84.8  PLT 159 174   Cardiac Enzymes:  Recent Labs  09/14/15 1158 09/14/15 1814 09/14/15 2341  TROPONINI 0.11* 0.26* 0.29*   BNP: Invalid input(s): POCBNP D-Dimer: No results for input(s): DDIMER in the last 72 hours. Hemoglobin A1C: No results for input(s): HGBA1C in the last 72 hours. Fasting Lipid Panel: No results for  input(s): CHOL, HDL, LDLCALC, TRIG, CHOLHDL, LDLDIRECT in the last 72 hours. Thyroid Function Tests:  Recent Labs  09/14/15 1158  TSH 1.156   Anemia Panel: No results for input(s): VITAMINB12, FOLATE, FERRITIN, TIBC, IRON, RETICCTPCT in the last 72 hours. Coag Panel:   Lab Results  Component Value Date   INR 0.95 07/04/2009    RADIOLOGY: Dg Chest 2 View  09/14/2015  CLINICAL DATA:  Left-sided chest pain for a week. EXAM: CHEST  2 VIEW COMPARISON:  None. FINDINGS: The heart, hila, mediastinum, lungs, and pleura are normal. There is erosion of the distal right clavicle which is nonspecific but could be from previous trauma. No other acute abnormalities. IMPRESSION: No acute abnormalities seen within the chest. Erosion of the distal right clavicle is probably not acute and could be from previous trauma. Recommend clinical correlation. Electronically Signed   By: David  Williams III M.D   On: 09/14/2015 08:10      ASSESSMENT/PLAN:   1.  NSTEMI with progressive exertional CP relieved with SL NTG.  EKG with no ST changes.  Check 2D echo to assess LVF.  Will make NPO   tonight for cath tomorrow.  Cardiac catheterization was discussed with the patient fully. The patient understands that risks include but are not limited to stroke (1 in 1000), death (1 in 1000), kidney failure [usually temporary] (1 in 500), bleeding (1 in 200), allergic reaction [possibly serious] (1 in 200).  The patient understands and is willing to proceed.  Continue ASA/IV Heparin gtt/BB/NTG paste/statin.  Hold diuretic until after cath.   2.  OSA 3.  HTN controlled on BB and ARB.   4.  Dyslipidemia - continue statin and check FLP   Landa Mullinax R, MD  09/15/2015  8:07 AM  

## 2015-09-16 NOTE — Progress Notes (Signed)
Heparin gtt stopped on call to cath lab. Pt notified his wife that he is going to the cath lab at this time.

## 2015-09-16 NOTE — Progress Notes (Signed)
TR BAND REMOVAL  LOCATION:    right radial  DEFLATED PER PROTOCOL:    Yes.    TIME BAND OFF / DRESSING APPLIED:    1630   SITE UPON ARRIVAL:    Level 0  SITE AFTER BAND REMOVAL:    Level 0  CIRCULATION SENSATION AND MOVEMENT:    Within Normal Limits   Yes.    COMMENTS:   Hematoma developed during deflation, pressure held, hematoma resolved. No bleeding/oozing noted. Patient tolerated well. Dressing applied C/D/I. Patient educated on precautions post TR Band removal. No s/s of distress noted or complaints voiced at this time.

## 2015-09-16 NOTE — Progress Notes (Signed)
Patient Name: Raymond Jordan Date of Encounter: 09/16/2015    Primary Cardiologist: Dr. Mayford Knifeurner Patient Profile: Raymond Jordan is a 55 year old male with PMH of HTN, HLD, sleep apnea. He came to ED on 09/14/15 after having 2 weeks of intermittent chest pain.  Troponin 0.11>0.26>0.29. Started on heparin drip.  EKG NSR with no ischemia.   SUBJECTIVE: No chest pain over night, says when he takes a deep breath he cannot fully expand his lungs without feeling tightness. No SOB.    OBJECTIVE Filed Vitals:   09/15/15 2100 09/15/15 2335 09/16/15 0500 09/16/15 0638  BP: 125/68  98/49 101/49  Pulse: 74 70 64   Temp: 98.2 F (36.8 C)  97.8 F (36.6 C)   TempSrc:      Resp: 21 18 20    Height:      Weight:   261 lb (118.389 kg)   SpO2: 98% 97% 98%     Intake/Output Summary (Last 24 hours) at 09/16/15 0836 Last data filed at 09/16/15 0500  Gross per 24 hour  Intake    480 ml  Output      0 ml  Net    480 ml   Filed Weights   09/15/15 0400 09/15/15 1932 09/16/15 0500  Weight: 261 lb (118.389 kg) 260 lb 12.9 oz (118.3 kg) 261 lb (118.389 kg)    PHYSICAL EXAM General: Well developed, well nourished, male in no acute distress. Head: Normocephalic, atraumatic.  Neck: Supple without bruits, No JVD. Lungs:  Resp regular and unlabored, CTA. Heart: RRR, S1, S2, no S3, S4, No murmur; no rub. Abdomen: Soft, non-tender, non-distended, BS + x 4.  Extremities: No clubbing, cyanosis, edema.  Neuro: Alert and oriented X 3. Moves all extremities spontaneously. Psych: Normal affect.  LABS: CBC: Recent Labs  09/15/15 0631 09/16/15 0333  WBC 6.0 5.9  HGB 12.7* 11.8*  HCT 38.6* 36.7*  MCV 84.8 84.8  PLT 174 154   INR: Recent Labs  09/16/15 0333  INR 1.19   Basic Metabolic Panel: Recent Labs  09/14/15 0645 09/14/15 1158 09/16/15 0333  NA 140  --  140  K 3.4*  --  3.7  CL 105  --  106  CO2 27  --  26  GLUCOSE 105*  --  105*  BUN 17  --  17  CREATININE 1.15 1.14 1.09    CALCIUM 9.3  --  9.1   Cardiac Enzymes: Recent Labs  09/14/15 1158 09/14/15 1814 09/14/15 2341  TROPONINI 0.11* 0.26* 0.29*    Recent Labs  09/14/15 0651  TROPIPOC 0.03   Fasting Lipid Panel: Recent Labs  09/16/15 0333  CHOL 142  HDL 33*  LDLCALC 85  TRIG [id number]  CHOLHDL 4.3   Thyroid Function Tests: Recent Labs  09/14/15 1158  TSH 1.156    TELE:  NSR  ECG: NSR    Current Medications:  . aspirin  81 mg Oral Once  . losartan  100 mg Oral Daily  . metoprolol tartrate  25 mg Oral BID  . nitroGLYCERIN  1 inch Topical 3 times per day  . pantoprazole  40 mg Oral BID AC  . pravastatin  20 mg Oral Daily  . sodium chloride flush  3 mL Intravenous Q12H  . sodium chloride flush  3 mL Intravenous Q12H   . sodium chloride 1 mL/kg/hr (09/16/15 [id number])  . heparin 1,300 Units/hr (09/16/15 0350)    ASSESSMENT AND PLAN: Principal Problem:   Atypical chest pain  Active Problems:   Essential hypertension, benign   Pure hypercholesterolemia   OSA on CPAP   Obesity   NSTEMI (non-ST elevated myocardial infarction) (HCC)  1. NSTEMI - Cath today. - Pt. Is NPO - Echo pending - Pt is on BB  2. HTN - Well controlled with CCB and ARB.   3. HLD - On low dose statin.   - Would recommend high dose if pt. Gets PCI.    Signed, Little Ishikawa , NP 8:36 AM 09/16/2015   I have seen and examined the patient along with Little Ishikawa , NP.  I have reviewed the chart, notes and new data.  I agree with NP's note.  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 Minor lesions in other coronary territories, mild inferior hypokinesis.. Probable DC in AM if no overnight complications.  Thurmon Fair, MD, [facility] [phone] 09/16/2015, 1:05 PM

## 2015-09-16 NOTE — Care Management Note (Signed)
Case Management Note  Patient Details  Name: Leverne HumblesHoward N Papp MRN: [id number] Date of Birth: 1961-03-05  Subjective/Objective:  Patient is from home with wife, pta indep. On [place], [facility] [place] has in stock, co pay is $100.  NCM gave patient 30 day savings card  And commericial savings card.                  Action/Plan:   Expected Discharge Date:                  Expected Discharge Plan:  Home/Self Care  In-House Referral:     Discharge planning Services  CM Consult  Post Acute Care Choice:    Choice offered to:     DME Arranged:    DME Agency:     HH Arranged:    HH Agency:     Status of Service:  Completed, signed off  Medicare Important Message Given:    Date Medicare IM Given:    Medicare IM give by:    Date Additional Medicare IM Given:    Additional Medicare Important Message give by:     If discussed at Long Length of Stay Meetings, dates discussed:    Additional Comments:  Leone Havenaylor, Meshia Rau Clinton, RN 09/16/2015, 2:38 PM

## 2015-09-16 NOTE — Progress Notes (Signed)
Placed patient on CPAP for the night.  

## 2015-09-16 NOTE — Interval H&P Note (Signed)
History and Physical Interval Note:  09/16/2015 11:02 AM  Raymond HumblesHoward N Powell  has presented today for cardiac cath with the diagnosis of unstable angina/NSTEMI.  The various methods of treatment have been discussed with the patient and family. After consideration of risks, benefits and other options for treatment, the patient has consented to  Procedure(s): Left Heart Cath and Coronary Angiography (N/A) as a surgical intervention .  The patient's history has been reviewed, patient examined, no change in status, stable for surgery.  I have reviewed the patient's chart and labs.  Questions were answered to the patient's satisfaction.     Axl Rodino

## 2015-09-16 NOTE — Progress Notes (Signed)
PROGRESS NOTE  Raymond Jordan DOB: [birth year] DOA: 09/14/2015 PCP: Hollice Espy, MD Outpatient Specialists:    LOS: 1 day   Brief Narrative: 55 y.o. 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   Assessment & Plan: Principal Problem:   Atypical chest pain Active Problems:   Essential hypertension, benign   Pure hypercholesterolemia   OSA on CPAP   Obesity   NSTEMI (non-ST elevated myocardial infarction) (HCC)   Chest pain / NSTEMI - cardiology consulted, appreciate input - cardiac cath today, s/p PTCA / DES x 1 to proximal RCA - 2D echo with normal EF 60-65%, grade 1 DD  OSA - continue CPAP  HTN - controlled, continue current regimen  Obesity - diet education as outpatient   HLD - on statin   DVT prophylaxis: heparin gtt Code Status: Full Family Communication: d/w wife bedside Disposition Plan: home when ready  Barriers for discharge: cath   Consultants:   Cardiology   Procedures:   2D echo  Cath   Antimicrobials:  None    Subjective: - no chest pain or shortness of breath this morning. Awaiting cath   Objective: Filed Vitals:   09/16/15 1211 09/16/15 1216 09/16/15 1221 09/16/15 1226  BP: 120/78 119/79 116/80   Pulse: 82 85 80 0  Temp:      TempSrc:      Resp: 18 12  0  Height:      Weight:      SpO2: 100% 98%  0%    Intake/Output Summary (Last 24 hours) at 09/16/15 1239 Last data filed at 09/16/15 1044  Gross per 24 hour  Intake 790.44 ml  Output      0 ml  Net 790.44 ml   Filed Weights   09/15/15 0400 09/15/15 1932 09/16/15 0500  Weight: 118.389 kg (261 lb) 118.3 kg (260 lb 12.9 oz) 118.389 kg (261 lb)    Examination: BP 116/80 mmHg  Pulse 0  Temp(Src) 97.8 F (36.6 C) (Oral)  Resp 0  Ht  (1.803 m)  Wt 118.389 kg (261 lb)  BMI 36.42 kg/m2   SpO2 0%  GENERAL: NAD  HEENT: head NCAT, no scleral icterus.  NECK: Supple. No carotid bruits. No lymphadenopathy or thyromegaly.  LUNGS: Clear to auscultation. No wheezing or crackles  HEART: Regular rate and rhythm without murmur. 2+ pulses, no JVD, no peripheral edema  ABDOMEN: Soft, nontender, and nondistended. Positive bowel sounds.   NEUROLOGIC: non focal   Data Reviewed: I have personally reviewed following labs and imaging studies  CBC:  Recent Labs Lab 09/14/15 0645 09/14/15 1158 09/15/15 0631 09/16/15 0333  WBC 6.0 5.3 6.0 5.9  HGB 14.1 12.8* 12.7* 11.8*  HCT 41.6 39.3 38.6* 36.7*  MCV 84.4 84.7 84.8 84.8  PLT 178 159 174 154   Basic Metabolic Panel:  Recent Labs Lab 09/14/15 0645 09/14/15 1158 09/16/15 0333  NA 140  --  140  K 3.4*  --  3.7  CL 105  --  106  CO2 27  --  26  GLUCOSE 105*  --  105*  BUN 17  --  17  CREATININE 1.15 1.14 1.09  CALCIUM 9.3  --  9.1   Coagulation Profile:  Recent Labs Lab 09/16/15 0333  INR 1.19   Cardiac Enzymes:  Recent Labs Lab 09/14/15 1158 09/14/15 1814 09/14/15  2341  TROPONINI 0.11* 0.26* 0.29*   Lipid Profile:  Recent Labs  09/15/15 0631 09/16/15 0333  CHOL 169 142  HDL 36* 33*  LDLCALC 101* 85  TRIG 161* 120  CHOLHDL 4.7 4.3   Thyroid Function Tests:  Recent Labs  09/14/15 1158  TSH 1.156   Scheduled Meds: . [MAR Hold] aspirin  81 mg Oral Once  . [MAR Hold] losartan  100 mg Oral Daily  . [MAR Hold] metoprolol tartrate  25 mg Oral BID  . [MAR Hold] nitroGLYCERIN  1 inch Topical 3 times per day  . [MAR Hold] pantoprazole  40 mg Oral BID AC  . [MAR Hold] pravastatin  20 mg Oral Daily  . [MAR Hold] sodium chloride flush  3 mL Intravenous Q12H  . sodium chloride flush  3 mL Intravenous Q12H   Continuous Infusions: . sodium chloride 1 mL/kg/hr (09/16/15 16100638)  . heparin Stopped (09/16/15 1039)   Pamella Pertostin Latrece Nitta, MD, PhD [facility] Pager [phone]  If 7PM-7AM, please  contact night-coverage [email or web address] Password [email or web address] 09/16/2015, 12:39 PM

## 2015-09-16 NOTE — Progress Notes (Signed)
ANTICOAGULATION CONSULT NOTE - Initial Consult  Pharmacy Consult for heparin Indication: ACS/STEMI  Allergies  Allergen Reactions  . Crestor [Rosuvastatin]     Muscle aches  . Lipitor [Atorvastatin]     Patient Measurements: Height: 5\' 11"  (180.3 cm) Weight: 261 lb (118.389 kg) IBW/kg (Calculated) : 75.3 Heparin Dosing Weight: 102 kg  Vital Signs: Temp: 97.8 F (36.6 C) (03/13 0500) BP: 101/49 mmHg (03/13 0638) Pulse Rate: 64 (03/13 0500)  Labs:  Recent Labs  09/14/15 0645 09/14/15 1158 09/14/15 1814 09/14/15 2016 09/14/15 2341 09/15/15 0631 09/16/15 0333  HGB 14.1 12.8*  --   --   --  12.7* 11.8*  HCT 41.6 39.3  --   --   --  38.6* 36.7*  PLT 178 159  --   --   --  174 154  LABPROT  --   --   --   --   --   --  15.3*  INR  --   --   --   --   --   --  1.19  HEPARINUNFRC  --   --   --  0.50  --  0.40 0.57  CREATININE 1.15 1.14  --   --   --   --  1.09  TROPONINI  --  0.11* 0.26*  --  0.29*  --   --     Estimated Creatinine Clearance: 103.7 mL/min (by C-G formula based on Cr of 1.09).   Medical History: Past Medical History  Diagnosis Date  . Hypertension   . Hyperlipidemia   . Obstructive sleep apnea on CPAP   . Allergy   . Allergic rhinitis     /asthma  . Asthma   . Hypercholesteremia   . ED (erectile dysfunction)   . Osteoarthritis     OA knees, shoulders; DDD lumbar spine  . NSTEMI (non-ST elevated myocardial infarction) (HCC) 09/15/2015    Assessment: 55 y/o man admitted 09/14/2015 with chest pain with troponin rising to 0.29. Pharmacy consulted to dose heparin for NSTEMI.   On heparin for ACS. Last HL remains therapeutic at 0.57. Hgb down to 11.8, plts wnl. No s/s of bleed.  Goal of Therapy:  Heparin level 0.3-0.7 units/ml Monitor platelets by anticoagulation protocol: Yes   Plan:  Continue heparin gtt at 1,300 units/hr Monitor daily HL, CBC, s/s of bleed F/U cath (1200), ECHO  Enzo BiNathan Tiah Heckel, PharmD, [facility] CenterBCPS Clinical  Pharmacist Pager [phone] 09/16/2015 8:52 AM

## 2015-09-16 NOTE — Progress Notes (Signed)
  Echocardiogram 2D Echocardiogram has been performed.  Raymond Jordan, Raymond Jordan 09/16/2015, 9:09 AM

## 2015-09-17 ENCOUNTER — Other Ambulatory Visit: Payer: Self-pay | Admitting: *Deleted

## 2015-09-17 DIAGNOSIS — R0789 Other chest pain: Secondary | ICD-10-CM

## 2015-09-17 LAB — CBC
HEMATOCRIT: 35 % — AB (ref 39.0–52.0)
Hemoglobin: 12 g/dL — ABNORMAL LOW (ref 13.0–17.0)
MCH: 28.5 pg (ref 26.0–34.0)
MCHC: 34.3 g/dL (ref 30.0–36.0)
MCV: 83.1 fL (ref 78.0–100.0)
Platelets: 126 10*3/uL — ABNORMAL LOW (ref 150–400)
RBC: 4.21 MIL/uL — ABNORMAL LOW (ref 4.22–5.81)
RDW: 13.3 % (ref 11.5–15.5)
WBC: 6.2 10*3/uL (ref 4.0–10.5)

## 2015-09-17 LAB — BASIC METABOLIC PANEL
ANION GAP: 7 (ref 5–15)
BUN: 14 mg/dL (ref 6–20)
CALCIUM: 8.4 mg/dL — AB (ref 8.9–10.3)
CHLORIDE: 111 mmol/L (ref 101–111)
CO2: 21 mmol/L — AB (ref 22–32)
Creatinine, Ser: 1.06 mg/dL (ref 0.61–1.24)
GFR calc Af Amer: >60 mL/min (ref >60)
GFR calc non Af Amer: >60 mL/min (ref >60)
GLUCOSE: 122 mg/dL — AB (ref 65–99)
POTASSIUM: 3.8 mmol/L (ref 3.5–5.1)
Sodium: 139 mmol/L (ref 135–145)

## 2015-09-17 MED ORDER — PRAVASTATIN SODIUM 40 MG PO TABS: 40.0000 mg | ORAL_TABLET | Freq: Every day | ORAL | Status: DC

## 2015-09-17 MED ORDER — METOPROLOL TARTRATE 25 MG PO TABS: 25.0000 mg | ORAL_TABLET | Freq: Two times a day (BID) | ORAL | Status: DC

## 2015-09-17 MED ORDER — AMBULATORY NON FORMULARY MEDICATION: 90.0000 mg | Freq: Two times a day (BID) | Status: DC

## 2015-09-17 MED ORDER — AMBULATORY NON FORMULARY MEDICATION: 81.0000 mg | Freq: Every day | Status: DC

## 2015-09-17 NOTE — Care Management Note (Signed)
Case Management Note  Patient Details  Name: Raymond Jordan MRN: [id number] Date of Birth: May 04, 1961  Subjective/Objective:  Patient decided to participate in the Twilight Study for Brilinta. No needs.                  Action/Plan:   Expected Discharge Date:  09/17/15               Expected Discharge Plan:  Home/Self Care  In-House Referral:     Discharge planning Services  CM Consult  Post Acute Care Choice:    Choice offered to:     DME Arranged:    DME Agency:     HH Arranged:    HH Agency:     Status of Service:  Completed, signed off  Medicare Important Message Given:    Date Medicare IM Given:    Medicare IM give by:    Date Additional Medicare IM Given:    Additional Medicare Important Message give by:     If discussed at Long Length of Stay Meetings, dates discussed:    Additional Comments:  Raymond Jordan, Raymond Nguyen [place], RN 09/17/2015, 2:08 PM

## 2015-09-17 NOTE — Discharge Instructions (Addendum)
Follow with Hollice EspyGATES,Raymond RUTH, MD in 5-7 days  You will be on Brillinta and Aspirin in addition to your current medications, these will be given to you by Research Study RN as part of Twilight study  Please get a complete blood count and chemistry panel checked by your Primary MD at your next visit, and again as instructed by your Primary MD. Please get your medications reviewed and adjusted by your Primary MD.  Please request your Primary MD to go over all Hospital Tests and Procedure/Radiological results at the follow up, please get all Hospital records sent to your Prim MD by signing hospital release before you go home.  If you had Pneumonia of Lung problems at the Hospital: Please get a 2 view Chest X ray done in 6-8 weeks after hospital discharge or sooner if instructed by your Primary MD.  If you have Congestive Heart Failure: Please call your Cardiologist or Primary MD anytime you have any of the following symptoms:  1) 3 pound weight gain in 24 hours or 5 pounds in 1 week  2) shortness of breath, with or without a dry hacking cough  3) swelling in the hands, feet or stomach  4) if you have to sleep on extra pillows at night in order to breathe  Follow cardiac low salt diet and 1.5 lit/day fluid restriction.  If you have diabetes Accuchecks 4 times/day, Once in AM empty stomach and then before each meal. Log in all results and show them to your primary doctor at your next visit. If any glucose reading is under 80 or above 300 call your primary MD immediately.  If you have Seizure/Convulsions/Epilepsy: Please do not drive, operate heavy machinery, participate in activities at heights or participate in high speed sports until you have seen by Primary MD or a Neurologist and advised to do so again.  If you had Gastrointestinal Bleeding: Please ask your Primary MD to check a complete blood count within one week of discharge or at your next visit. Your endoscopic/colonoscopic biopsies  that are pending at the time of discharge, will also need to followed by your Primary MD.  Get Medicines reviewed and adjusted. Please take all your medications with you for your next visit with your Primary MD  Please request your Primary MD to go over all hospital tests and procedure/radiological results at the follow up, please ask your Primary MD to get all Hospital records sent to his/her office.  If you experience worsening of your admission symptoms, develop shortness of breath, life threatening emergency, suicidal or homicidal thoughts you must seek medical attention immediately by calling 911 or calling your MD immediately  if symptoms less severe.  You must read complete instructions/literature along with all the possible adverse reactions/side effects for all the Medicines you take and that have been prescribed to you. Take any new Medicines after you have completely understood and accpet all the possible adverse reactions/side effects.   Do not drive or operate heavy machinery when taking Pain medications.   Do not take more than prescribed Pain, Sleep and Anxiety Medications  Special Instructions: If you have smoked or chewed Tobacco  in the last 2 yrs please stop smoking, stop any regular Alcohol  and or any Recreational drug use.  Wear Seat belts while driving.  Please note You were cared for by a hospitalist during your hospital stay. If you have any questions about your discharge medications or the care you received while you were in the hospital after  you are discharged, you can call the unit and asked to speak with the hospitalist on call if the hospitalist that took care of you is not available. Once you are discharged, your primary care physician will handle any further medical issues. Please note that NO REFILLS for any discharge medications will be authorized once you are discharged, as it is imperative that you return to your primary care physician (or establish a  relationship with a primary care physician if you do not have one) for your aftercare needs so that they can reassess your need for medications and monitor your lab values.  You can reach the hospitalist office at phone [phone] or fax [phone]   If you do not have a primary care physician, you can call [phone] for a physician referral.  Activity: As tolerated with Full fall precautions use walker/cane & assistance as needed  Diet: heart healthy  Disposition Home

## 2015-09-17 NOTE — Progress Notes (Signed)
CARDIAC REHAB PHASE I   PRE:  Rate/Rhythm: 89 SR    BP: sitting 136/81    SaO2:   MODE:  Ambulation: 1000 ft   POST:  Rate/Rhythm: 111 ST with PVC    BP: sitting 159/96     SaO2:   Tolerated well with quick pace. Sts he feels slight sensation from stent being placed. Ed completed with pt and wife, good reception. He wants to try CRPII and will send referral but schedule might be an issue. Understands importance of Brilinta. [id number]   Harriet MassonRandi Kristan Thora Scherman CES, ACSM 09/17/2015 8:58 AM

## 2015-09-17 NOTE — Research (Signed)
TWILIGHT Research Study presented to patient and family member. Patient meets Inclusion r/t thrombos RCA. Informed Consent form left for patient/ family to review. I will follow up in a few hours, left patients nurse my cell phone if patient has questions.

## 2015-09-17 NOTE — Research (Signed)
TWILIGHT Informed Consent   Subject Name: NASSER KU  Subject met inclusion and exclusion criteria.  The informed consent form, study requirements and expectations were reviewed with the subject and questions and concerns were addressed prior to the signing of the consent form.  The subject verbalized understanding of the trail requirements.  The subject agreed to participate in the TWILIGHT trial and signed the informed consent.  The informed consent was obtained prior to performance of any protocol-specific procedures for the subject.  A copy of the signed informed consent was given to the subject and a copy was placed in the subject's medical record.  Hedrick,Tammy W 09/17/2015, 11:20

## 2015-09-17 NOTE — Research (Signed)
Brilinta 90 mg bid and ASA 81 mg supplied from the [employer] research study. Instructions given to patient and family. Questions encouraged and answered. Follow up schedule for study given to patient.

## 2015-09-17 NOTE — Discharge Summary (Signed)
Physician Discharge Summary  Raymond HumblesHoward N Postle [id number] DOB: 1960/08/07 DOA: 09/14/2015  PCP: Hollice EspyGATES,DONNA RUTH, MD  Admit date: 09/14/2015 Discharge date: 09/17/2015  Time spent: > 30 minutes  Recommendations for Outpatient Follow-up:  1. Follow up with cardiology as an outpatient 2. Patient [place] in [place] research study on discharge   Discharge Diagnoses:  Principal Problem:   Atypical chest pain Active Problems:   Essential hypertension, benign   Pure hypercholesterolemia   OSA on CPAP   Obesity   NSTEMI (non-ST elevated myocardial infarction) [facility](HCC)  Discharge Condition: stable  Diet recommendation: heart healthy  Filed Weights   09/15/15 1932 09/16/15 0500 09/17/15 0447  Weight: 118.3 kg (260 lb 12.9 oz) 118.389 kg (261 lb) 119 kg (262 lb 5.6 oz)    History of present illness:  See H&P, Labs, Consult and Test reports for all details in brief, patient is a 55 y.o. 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   Hospital Course   Chest pain / NSTEMI - patient was admitted to the hospital with chest pain suggestive of cardiac etiology as well as positive troponin. Cardiology was consulted and have followed patient while hospitalized. Patient underwent a cardiac catheterization on 3/13 s/p PTCA / DES x 1 to proximal RCA (full results below). He also underwent a 2-D echo with normal EF 60-65%, grade 1 DD (full results below). Patient will need to be on Brilinta and Aspirin on discharge, given the fact that he was enrolled into the twilight research study he will be given these medications directly by the research personnel and are not included in this discharge summary. I discussed with Amelia Joammy Hedrick, RN who is involved in the study. He will also be on a beta blocker as well as a statin.  OSA - continue CPAP HTN -  controlled, because of #1 patient was started on a beta blocker, his Norvasc and Hyzaar were held, his blood pressure has remained stable, he is to have follow-up with cardiology, and if blood pressure allows at that time to consider restarting either of these.  Obesity - diet education as outpatient. Patient was on weight loss pills, advised to discontinue HLD - on statin   Procedures:  2D echo Study Conclusions - Left ventricle: The cavity size was normal. Wall thickness was normal. Systolic function was normal. The estimated ejection fraction was in the range of 60% to 65%. Wall motion was normal; there were no regional wall motion abnormalities. Doppler parameters are consistent with abnormal left ventricular relaxation (grade 1 diastolic dysfunction).     Coronary Stent Intervention   Left Heart Cath and Coronary Angiography    Conclusion     Prox RCA to Mid RCA lesion, 20% stenosed.  Mid RCA lesion, 20% stenosed.  Prox Cx to Mid Cx lesion, 20% stenosed.  Prox RCA lesion, 99% stenosed. Post intervention, there is a 0% residual stenosis.  There is mild left ventricular systolic dysfunction.  1. NSTEMI secondary to severe, ulcerated, thrombotic appearing stenosis proximal RCA 2. Mild segmental LV systolic dysfunction 3. Successful PTCA/DES x 1 proximal RCA   Consultations:  Cardiology   Discharge Exam: Filed Vitals:   09/16/15 2057 09/16/15 2239 09/17/15 0447 09/17/15 0744  BP: 113/68  122/74 136/81  Pulse: 71 75 69 77  Temp: 98.5 F (36.9 C)  98.8 F (37.1 C) 98.4 F (  36.9 C)  TempSrc: Oral  Oral Oral  Resp: Height:      Weight:   119 kg (262 lb 5.6 oz)   SpO2: 96% 97% 98% 97%   General: NAD Cardiovascular: RRR Respiratory: CTA biL  Discharge Instructions Activity:  As tolerated   Get Medicines reviewed and adjusted: Please take all your medications with you for your next visit with your Primary MD  Please request your Primary MD to  go over all hospital tests and procedure/radiological results at the follow up, please ask your Primary MD to get all Hospital records sent to his/her office.  If you experience worsening of your admission symptoms, develop shortness of breath, life threatening emergency, suicidal or homicidal thoughts you must seek medical attention immediately by calling 911 or calling your MD immediately if symptoms less severe.  You must read complete instructions/literature along with all the possible adverse reactions/side effects for all the Medicines you take and that have been prescribed to you. Take any new Medicines after you have completely understood and accpet all the possible adverse reactions/side effects.   Do not drive when taking Pain medications.   Do not take more than prescribed Pain, Sleep and Anxiety Medications  Special Instructions: If you have smoked or chewed Tobacco in the last 2 yrs please stop smoking, stop any regular Alcohol and or any Recreational drug use.  Wear Seat belts while driving.  Please note  You were cared for by a hospitalist during your hospital stay. Once you are discharged, your primary care physician will handle any further medical issues. Please note that NO REFILLS for any discharge medications will be authorized once you are discharged, as it is imperative that you return to your primary care physician (or establish a relationship with a primary care physician if you do not have one) for your aftercare needs so that they can reassess your need for medications and monitor your lab values. Discharge Instructions    Amb Referral to Cardiac Rehabilitation    Complete by:  As directed   Diagnosis:   PCI Myocardial Infarction              Medication List    STOP taking these medications        amLODipine 5 MG tablet  Commonly known as:  NORVASC     aspirin 81 MG chewable tablet     losartan-hydrochlorothiazide 100-25 MG tablet  Commonly known as:   HYZAAR     OVER THE COUNTER MEDICATION      TAKE these medications        cetirizine 10 MG tablet  Commonly known as:  ZYRTEC  Take 10 mg by mouth as needed for allergies.     fluticasone 50 MCG/ACT nasal spray  Commonly known as:  FLONASE  Place 2 sprays into both nostrils as needed for allergies or rhinitis.     metoprolol tartrate 25 MG tablet  Commonly known as:  LOPRESSOR  Take 1 tablet (25 mg total) by mouth 2 (two) times daily.     MULTIVITAMIN MEN PO  Take 1 capsule by mouth daily.     pravastatin 40 MG tablet  Commonly known as:  PRAVACHOL  Take 1 tablet (40 mg total) by mouth daily.  Start taking on:  09/18/2015           Follow-up Information    Follow up with Quintella Reichert, MD. Schedule an appointment as soon as possible for a visit  in 2 weeks.   Specialty:  Cardiology   Contact information:   [address] [place] [address] [phone]      The results of significant diagnostics from this hospitalization (including imaging, microbiology, ancillary and laboratory) are listed below for reference.    Significant Diagnostic Studies: Dg Chest 2 View  09/14/2015  CLINICAL DATA:  Left-sided chest pain for a week. EXAM: CHEST  2 VIEW COMPARISON:  None. FINDINGS: The heart, hila, mediastinum, lungs, and pleura are normal. There is erosion of the distal right clavicle which is nonspecific but could be from previous trauma. No other acute abnormalities. IMPRESSION: No acute abnormalities seen within the chest. Erosion of the distal right clavicle is probably not acute and could be from previous trauma. Recommend clinical correlation. Electronically Signed   By: Gerome Sam III M.D   On: 09/14/2015 08:10   Labs: Basic Metabolic Panel:  Recent Labs Lab 09/14/15 0645 09/14/15 1158 09/16/15 0333 09/17/15 0428  NA 140  --  140 139  K 3.4*  --  3.7 3.8  CL 105  --  106 111  CO2 27  --  26 21*  GLUCOSE 105*  --  105* 122*  BUN 17  --  17  14  CREATININE 1.15 1.14 1.09 1.06  CALCIUM 9.3  --  9.1 8.4*   CBC:  Recent Labs Lab 09/14/15 0645 09/14/15 1158 09/15/15 0631 09/16/15 0333 09/17/15 0428  WBC 6.0 5.3 6.0 5.9 6.2  HGB 14.1 12.8* 12.7* 11.8* 12.0*  HCT 41.6 39.3 38.6* 36.7* 35.0*  MCV 84.4 84.7 84.8 84.8 83.1  PLT 178 159 174 154 126*   Cardiac Enzymes:  Recent Labs Lab 09/14/15 1158 09/14/15 1814 09/14/15 2341  TROPONINI 0.11* 0.26* 0.29*    Signed:  GHERGHE, COSTIN  [facility] 09/17/2015, 12:46 PM

## 2015-09-17 NOTE — Progress Notes (Signed)
Patient Profile: 55 year old male with PMH of HTN, HLD and sleep apnea admitted for CP with enzyme elevation c/w NSTEMI. Troponin 0.11>0.26>0.29  Subjective: Doing well. No complaints. He denies CP and dyspnea. No issues with cath site.  Objective: Vital signs in last 24 hours: Temp:  [97.8 F (36.6 C)-98.8 F (37.1 C)] 98.4 F (36.9 C) (03/14 0744) Pulse Rate:  [0-96] 77 (03/14 0744) Resp:  [0-26] 13 (03/14 0744) BP: (105-136)/(55-87) 136/81 mmHg (03/14 0744) SpO2:  [0 %-100 %] 97 % (03/14 0744) Weight:  [262 lb 5.6 oz (119 kg)] 262 lb 5.6 oz (119 kg) (03/14 0447) Last BM Date: 09/16/15  Intake/Output from previous day: 03/13 0701 - 03/14 0700 In: 2015.4 [P.O.:840; I.V.:1175.4] Out: 2550 [Urine:2550] Intake/Output this shift:    Medications Current Facility-Administered Medications  Medication Dose Route Frequency Provider Last Rate Last Dose  . 0.9 %  sodium chloride infusion  250 mL Intravenous PRN Kathleene Hazel, MD      . acetaminophen (TYLENOL) tablet 650 mg  650 mg Oral Q6H PRN Houston Siren, MD   650 mg at 09/15/15 2300   Or  . acetaminophen (TYLENOL) suppository 650 mg  650 mg Rectal Q6H PRN Houston Siren, MD      . alum & mag hydroxide-simeth (MAALOX/MYLANTA) 200-200-20 MG/5ML suspension 30 mL  30 mL Oral Q6H PRN Houston Siren, MD      . angioplasty book   Does not apply Once Kathleene Hazel, MD      . aspirin chewable tablet 81 mg  81 mg Oral Once Houston Siren, MD   Stopped at 09/14/15 1136  . losartan (COZAAR) tablet 100 mg  100 mg Oral Daily Houston Siren, MD   100 mg at 09/16/15 1029  . metoprolol tartrate (LOPRESSOR) tablet 25 mg  25 mg Oral BID Houston Siren, MD   25 mg at 09/16/15 2123  . morphine 2 MG/ML injection 2 mg  2 mg Intravenous Q2H PRN Houston Siren, MD      . pantoprazole (PROTONIX) EC tablet 40 mg  40 mg Oral BID AC Costin Otelia Sergeant, MD   40 mg at 09/16/15 1628  . pravastatin (PRAVACHOL) tablet 20 mg  20 mg Oral Daily Houston Siren, MD   20 mg at 09/16/15 1029  .  sodium chloride flush (NS) 0.9 % injection 3 mL  3 mL Intravenous Q12H Houston Siren, MD   3 mL at 09/14/15 2227  . sodium chloride flush (NS) 0.9 % injection 3 mL  3 mL Intravenous Q12H Kathleene Hazel, MD   3 mL at 09/16/15 1615  . sodium chloride flush (NS) 0.9 % injection 3 mL  3 mL Intravenous PRN Kathleene Hazel, MD      . ticagrelor [facility]) tablet 90 mg  90 mg Oral BID Kathleene Hazel, MD   90 mg at 09/16/15 2123    PE: General appearance: alert, cooperative and no distress Neck: no carotid bruit and no JVD Lungs: clear to auscultation bilaterally Heart: regular rate and rhythm, S1, S2 normal, no murmur, click, rub or gallop Extremities: no LEE Pulses: 2+ and symmetric Skin: warm and dry Neurologic: Grossly normal  Lab Results:   Recent Labs  09/15/15 0631 09/16/15 0333 09/17/15 0428  WBC 6.0 5.9 6.2  HGB 12.7* 11.8* 12.0*  HCT 38.6* 36.7* 35.0*  PLT 174 154 126*   BMET  Recent Labs  09/14/15 1158 09/16/15 0333 09/17/15 0428  NA  --  140 139  K  --  3.7 3.8  CL  --  106 111  CO2  --  26 21*  GLUCOSE  --  105* 122*  BUN  --  17 14  CREATININE 1.14 1.09 1.06  CALCIUM  --  9.1 8.4*   PT/INR  Recent Labs  09/16/15 0333  LABPROT 15.3*  INR 1.19   Cholesterol  Recent Labs  09/16/15 0333  CHOL 142   Cardiac Panel (last 3 results)  Recent Labs  09/14/15 1158 09/14/15 1814 09/14/15 2341  TROPONINI 0.11* 0.26* 0.29*    Studies/Results: LHC 09/16/15  Procedures    Coronary Stent Intervention   Left Heart Cath and Coronary Angiography    Conclusion     Prox RCA to Mid RCA lesion, 20% stenosed.  Mid RCA lesion, 20% stenosed.  Prox Cx to Mid Cx lesion, 20% stenosed.  Prox RCA lesion, 99% stenosed. Post intervention, there is a 0% residual stenosis.  There is mild left ventricular systolic dysfunction.  1. NSTEMI secondary to severe, ulcerated, thrombotic appearing stenosis proximal RCA 2. Mild segmental LV  systolic dysfunction 3. Successful PTCA/DES x 1 proximal RCA   Left Heart    Left Ventricle The left ventricular size is normal. There is mild left ventricular systolic dysfunction. There are wall motion abnormalities in the left ventricle. There are segmental wall motion abnormalities in the left ventricle.     Assessment/Plan  Principal Problem:   Atypical chest pain Active Problems:   Essential hypertension, benign   Pure hypercholesterolemia   OSA on CPAP   Obesity   NSTEMI (non-ST elevated myocardial infarction) (HCC)   1. NSTEMI: secondary to severe, ulcerated, thrombotic appearing stenosis proximal RCA. S/p successful PCI + DES x 1 to proximal RCA. EF normal by echo at 60-65%. No WMA. He is CP free. No dyspnea. HR and BP are both well controlled. Continue DAPT with ASA + Brilinta for a minimum of 1 year given DES. Continue BB, statin and ARB.  2. Post Cath: renal function is stable. VSS. Right radial cath site is stable w/o complication. Patient to ambulate with cardiac rehab.  3. HTN: BP is well controlled on losartan and metoprolol.  4. HLD: FLP shows LDL of 85 mg/dL. Goal is < 70 mg/dL. He is currently on pravastatin, intolerant to Lipitor and Crestor. Consider adding Zetia if not at goal in 6 weeks.   Dispo: stable from a cardiac standpoint. IM serving as primary and will d/c.    LOS: 2 days    Brittainy M. Sharol HarnessSimmons, PA-C 09/17/2015 8:08 AM  I have seen and examined the patient along with SIMMONS, BRITTAINY, PA-C.  I have reviewed the chart, notes and new data.  I agree with PA's note.  Key new complaints: feels well Key examination changes: no radial artery complications, normal cardiac exam Key new findings / data: discussed lipid profile, LDL 101, HDL 33  PLAN: Increase pravastatin to 40 mg and take in the evening. May add zetia for target LDL<70. Diet, exercise reviewed. Discussed mandatory DAPT for 12 months. F/u with Dr. Mayford Knifeurner.  Thurmon FairMihai Irma Roulhac, MD,  [facility] [phone] 09/17/2015, 10:22 AM

## 2015-09-20 ENCOUNTER — Other Ambulatory Visit: Payer: Self-pay | Admitting: *Deleted

## 2015-09-20 ENCOUNTER — Telehealth: Payer: Self-pay | Admitting: Cardiology

## 2015-09-20 MED ORDER — NITROGLYCERIN 0.4 MG SL SUBL: 0.4000 mg | SUBLINGUAL_TABLET | SUBLINGUAL | Status: DC | PRN

## 2015-09-20 NOTE — Telephone Encounter (Signed)
Follow Up:   Pt called to see if his medicine had been called in to [facility] [address].

## 2015-09-20 NOTE — Telephone Encounter (Signed)
OK to fill - has CAD

## 2015-09-20 NOTE — Telephone Encounter (Signed)
°*  STAT* If patient is at the pharmacy, call can be transferred to refill team.   1. Which medications need to be refilled? (please list name of each medication and dose if known) Nitrostat  2. Which pharmacy/location (including street and city if local pharmacy) is medication to be sent to?[facility] on [address]  3. Do they need a 30 day or 90 day supply? 30

## 2015-09-20 NOTE — Telephone Encounter (Signed)
Patient requesting rx for nitro. Nitro not on current or historical med list. Ok to order for the patient?

## 2015-10-06 NOTE — Progress Notes (Addendum)
Cardiology Office Note:    Date:  10/07/2015   ID:  Raymond Jordan, DOB Nov 08, 1960, MRN [id number]  PCP:  Hollice Espy, MD  Cardiologist:  Dr. Armanda Magic   Electrophysiologist:  n/a  Chief Complaint  Patient presents with  . 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  Admitted 3/11-3/14 with a non-STEMI. LHC demonstrated proximal RCA 99% lesion which was treated with DES. EF was normal by echocardiogram. He returns for follow-up.  Here with his wife.  Doing well since DC.  He has an occ pain in his chest that is with deep inspiration.  This was mild at DC and is resolved now.  No recurrent angina.  Denies dyspnea, orthopnea, PND, edema.  Denies syncope.  He has a hx of hem bleeding but no other issues with bleeding.    Past Medical History  Diagnosis Date  . Hypertension   . Hyperlipidemia   . Seasonal allergies   . Allergic rhinitis     /asthma  . Hypercholesteremia   . ED (erectile dysfunction)   . 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 NSTEMI (non-ST elevated myocardial infarction) (HCC) 09/15/2015  . Childhood asthma   . Obstructive sleep apnea on CPAP   . Osteoarthritis     OA knees, shoulders; DDD lumbar spine  . Arthritis     "shoulders, back, knees" (09/16/2015)    Past Surgical History  Procedure Laterality Date  . Ankle surgery  1985    bone spur removal ankle.  . Nasal sinus surgery  ~ 2007    "& fixed my septum"  . Shoulder open rotator cuff repair Right 10/2013  . Myringotomy with tube placement Left ~ 07/2009  . Vasectomy    . Wisdom tooth extraction  ~ 1992  . Cardiac catheterization N/A 09/16/2015    Procedure: Left Heart Cath and Coronary Angiography;  Surgeon: Kathleene Hazel, MD;  Location: [facility] INVASIVE CV LAB;  Service: Cardiovascular;  Laterality: N/A;  . Cardiac catheterization N/A 09/16/2015    Procedure:  Coronary Stent Intervention;  Surgeon: Kathleene Hazel, MD;  Location: [facility] INVASIVE CV LAB;  Service: Cardiovascular;  Laterality: N/A;    Current Medications: Outpatient Prescriptions Prior to Visit  Medication Sig Dispense Refill  . AMBULATORY NON FORMULARY MEDICATION Take 81 mg by mouth daily. Medication Name: ASA 81 mg Daily (TWILIGHT Research Study Provided)    . cetirizine (ZYRTEC) 10 MG tablet Take 10 mg by mouth as needed for allergies.     . fluticasone (FLONASE) 50 MCG/ACT nasal spray Place 2 sprays into both nostrils as needed for allergies or rhinitis.    . metoprolol tartrate (LOPRESSOR) 25 MG tablet Take 1 tablet (25 mg total) by mouth 2 (two) times daily. 60 tablet 2  . Multiple Vitamins-Minerals (MULTIVITAMIN MEN PO) Take 1 capsule by mouth daily.    . nitroGLYCERIN (NITROSTAT) 0.4 MG SL tablet Place 1 tablet (0.4 mg total) under the tongue every 5 (five) minutes as needed for chest pain. 25 tablet 3  . pravastatin (PRAVACHOL) 40 MG tablet Take 1 tablet (40 mg total) by mouth daily. 30 tablet 1  . AMBULATORY NON FORMULARY MEDICATION Take 90 mg by mouth 2 (two) times daily. Medication Name: Brilinta 90 mg BID (TWILIGHT Research Study PROVIDED)     No facility-administered medications prior  to visit.     Allergies:   Crestor and Lipitor   Social History   Social History  . Marital Status: Married    Spouse Name: N/A  . Number of Children: 2  . Years of Education: N/A   Occupational History  . Truck driver    Social History Main Topics  . Smoking status: Former Games developer  . Smokeless tobacco: Never Used     Comment: "smoked 2 packs of cigaretes in my whole life"  . Alcohol Use: Yes     Comment: 09/16/2015 "might have a couple drinks q couple months"  . Drug Use: No  . Sexual Activity: Yes   Other Topics Concern  . None   Social History Narrative   Marital status: Married x 30 years      Children:  2 children; 1 stepchild; grandchild and two step  grandchildren.      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      Tobacco:  No tobacco      Alcohol: + weekends sometimes; two drinks per month.      Drugs: none      Exercise:  Walking and cycling.     Family History:  The patient's family history includes Arthritis in his father; Asthma in his mother; Cancer in his father; Diabetes in his mother; Heart attack in his father; Heart disease in his father; Sarcoidosis in his mother; Stroke in his father and mother.   ROS:   Please see the history of present illness.    ROS All other systems reviewed and are negative.   Physical Exam:    VS:  BP 128/70 mmHg  Pulse 62  Ht  (1.803 m)  Wt 256 lb 12.8 oz (116.484 kg)  BMI 35.83 kg/m2   GEN: Well nourished, well developed, in no acute distress HEENT: normal Neck: no JVD, no masses Cardiac: Normal S1/S2, RRR; no murmurs, rubs, or gallops, no edema;  right wrist without hematoma or mass    Respiratory:  clear to auscultation bilaterally; no wheezing, rhonchi or rales GI: soft, nontender, nondistended MS: no deformity or atrophy Skin: warm and dry Neuro: No focal deficits  Psych: Alert and oriented x 3, normal affect  Wt Readings from Last 3 Encounters:  10/07/15 256 lb 12.8 oz (116.484 kg)  09/17/15 262 lb 5.6 oz (119 kg)  01/11/15 262 lb (118.842 kg)      Studies/Labs Reviewed:     EKG:  EKG is  ordered today.  The ekg ordered today demonstrates NSR, HR 61, normal axis, QTc 390 ms  Recent Labs: 09/14/2015: TSH 1.156 09/17/2015: BUN 14; Creatinine, Ser 1.06; Hemoglobin 12.0*; Platelets 126*; Potassium 3.8; Sodium 139   Recent Lipid Panel    Component Value Date/Time   CHOL 142 09/16/2015 0333   TRIG 120 09/16/2015 0333   HDL 33* 09/16/2015 0333   CHOLHDL 4.3 09/16/2015 0333   VLDL 24 09/16/2015 0333   LDLCALC 85 09/16/2015 0333    Additional studies/ records that were reviewed today  include:   LHC 09/16/15 LAD ok RI ok LCx proximal 20% RCA prox 99%, mid 20% Mild LV dysfunction PCI: 4 x 16 mm Promus Premier DES to pRCA 1. NSTEMI secondary to severe, ulcerated, thrombotic appearing stenosis proximal RCA 2. Mild segmental LV systolic dysfunction 3. Successful PTCA/DES x 1 proximal RCA  Echo 09/16/15 EF 60-65%, normal wall motion, grade 1 diastolic dysfunction  ETT  2/15 normal - no evidence of ischemia by ST analysis   ASSESSMENT:     1. Coronary artery disease involving native coronary artery of native heart without angina pectoris   2. Essential hypertension, benign   3. Hyperlipidemia   4. OSA on CPAP     PLAN:     In order of problems listed above:  1. CAD - s/p NSTEMI tx with DES to RCA.  Doing well without angina.  We discussed the importance of dual antiplatelet therapy. He is interested in cardiac rehab.  -  [facility]  -  Continue ASA, Brilinta x min of 1 year  -  Continue beta-blocker, statin  2. HTN - Controlled  3. HL - Pravastatin dose increased in hospital.  Check Lipids and LFTs in 6 weeks.   4. OSA - Continue CPAP.     Medication Adjustments/Labs and Tests Ordered: Current medicines are reviewed at length with the patient today.  Concerns regarding medicines are outlined above.  Medication changes, Labs and Tests ordered today are outlined in the Patient Instructions noted below. Patient Instructions  Medication Instructions:  Your physician recommends that you continue on your current medications as directed. Please refer to the Current Medication list given to you today.  Labwork: 12/03/15 FOR FASTING CHOLESTEROL PANEL  Testing/Procedures: NONE  Follow-Up: KEEP YOUR APPT WITH DR. Mayford KnifeURNER 12/13/15 @ 3:45   Any Other Special Instructions Will Be Listed Below (If Applicable). CARDIAC REHAB AT [place]  If you need a refill on your cardiac medications before your next appointment, please call your  pharmacy.    Signed, Tereso NewcomerScott Loree Shehata, PA-C  10/07/2015 8:36 PM    [facility] [address], [place], [place]  [address] Phone: [phone]; Fax: [phone]

## 2015-10-07 ENCOUNTER — Ambulatory Visit (INDEPENDENT_AMBULATORY_CARE_PROVIDER_SITE_OTHER): Payer: 59 | Admitting: Physician Assistant

## 2015-10-07 ENCOUNTER — Encounter: Payer: Self-pay | Admitting: Physician Assistant

## 2015-10-07 VITALS — BP 128/70 | HR 62 | Ht 71.0 in | Wt 256.8 lb

## 2015-10-07 DIAGNOSIS — I2510 Atherosclerotic heart disease of native coronary artery without angina pectoris: Secondary | ICD-10-CM | POA: Diagnosis not present

## 2015-10-07 DIAGNOSIS — E785 Hyperlipidemia, unspecified: Secondary | ICD-10-CM | POA: Diagnosis not present

## 2015-10-07 DIAGNOSIS — Z9989 Dependence on other enabling machines and devices: Secondary | ICD-10-CM

## 2015-10-07 DIAGNOSIS — G4733 Obstructive sleep apnea (adult) (pediatric): Secondary | ICD-10-CM | POA: Diagnosis not present

## 2015-10-07 DIAGNOSIS — I10 Essential (primary) hypertension: Secondary | ICD-10-CM

## 2015-10-07 NOTE — Patient Instructions (Addendum)
Medication Instructions:  Your physician recommends that you continue on your current medications as directed. Please refer to the Current Medication list given to you today.  Labwork: 12/03/15 FOR FASTING CHOLESTEROL PANEL  Testing/Procedures: NONE  Follow-Up: KEEP YOUR APPT WITH DR. Mayford KnifeURNER 12/13/15 @ 3:45   Any Other Special Instructions Will Be Listed Below (If Applicable). CARDIAC REHAB AT [place]  If you need a refill on your cardiac medications before your next appointment, please call your pharmacy.

## 2015-10-11 ENCOUNTER — Ambulatory Visit: Payer: 59 | Admitting: Physician Assistant

## 2015-10-16 ENCOUNTER — Telehealth: Payer: Self-pay | Admitting: Cardiology

## 2015-10-16 NOTE — Telephone Encounter (Signed)
I spoke with the pt and made him aware that taking a statin is not a reason to avoid "juicing".  I made him aware that he should avoid grape fruit juice because of the interaction with statin.  I made him aware that with "juicing" the fiber is lost from fruits and vegetables.  The pt also asked why he is taking metoprolol tartrate and I educated him about the purpose of a beta blocker. The pt has signed up for cardiac rehabilitation. The pt will contact the office with any other questions or concerns.

## 2015-10-16 NOTE — Telephone Encounter (Signed)
New message      Pt want to begin "juicing" fruit and veggies.  Is this ok since he is on cholesterol mediction?

## 2015-10-17 ENCOUNTER — Encounter: Payer: Self-pay | Admitting: *Deleted

## 2015-10-17 DIAGNOSIS — Z006 Encounter for examination for normal comparison and control in clinical research program: Secondary | ICD-10-CM

## 2015-10-17 NOTE — Progress Notes (Signed)
TWILIGHT Research study 1 month telephone follow up completed. Patient denies any bleeding or adverse events. He also states he has been compliant with medication, may have missed 1 dose. Next Research visit scheduled 12/25/15 @ 2:30 He will be randomized at this appointment to ASA 81 mg Daily or Placebo. I have arranged to meet patient @ 2:30 prior to his cardiac rehab appointment. Questions encouraged and answered.

## 2015-11-12 ENCOUNTER — Encounter (HOSPITAL_COMMUNITY): Payer: Self-pay

## 2015-11-12 ENCOUNTER — Encounter (HOSPITAL_COMMUNITY)
Admission: RE | Admit: 2015-11-12 | Discharge: 2015-11-12 | Disposition: A | Discharge status: Home / Self Care | Payer: 59 | Source: Ambulatory Visit | Attending: Cardiology | Admitting: Cardiology

## 2015-11-12 VITALS — BP 126/82 | HR 72 | Ht 71.25 in | Wt 253.3 lb

## 2015-11-12 DIAGNOSIS — I214 Non-ST elevation (NSTEMI) myocardial infarction: Secondary | ICD-10-CM

## 2015-11-12 DIAGNOSIS — E7800 Pure hypercholesterolemia, unspecified: Secondary | ICD-10-CM | POA: Insufficient documentation

## 2015-11-12 DIAGNOSIS — E785 Hyperlipidemia, unspecified: Secondary | ICD-10-CM | POA: Diagnosis not present

## 2015-11-12 DIAGNOSIS — Z955 Presence of coronary angioplasty implant and graft: Secondary | ICD-10-CM | POA: Diagnosis present

## 2015-11-12 DIAGNOSIS — I10 Essential (primary) hypertension: Secondary | ICD-10-CM | POA: Diagnosis not present

## 2015-11-12 DIAGNOSIS — Z79899 Other long term (current) drug therapy: Secondary | ICD-10-CM | POA: Insufficient documentation

## 2015-11-12 NOTE — Progress Notes (Signed)
Cardiac Rehab Medication Review by a Pharmacist  Does the patient  feel that his/her medications are working for him/her?  yes  Has the patient been experiencing any side effects to the medications prescribed?  no  Does the patient measure his/her own blood pressure or blood glucose at home?  yes   Does the patient have any problems obtaining medications due to transportation or finances?   no  Understanding of regimen: good Understanding of indications: good Potential of compliance: good    Pharmacist comments: N/A    Raymond Jordan, PharmD Clinical Pharmacy Resident 2:24 PM, 11/12/2015

## 2015-11-13 NOTE — Progress Notes (Signed)
Cardiac Individual Treatment Plan  Patient Details  Name: Raymond Jordan MRN: [id number] Date of Birth: Jul 26, 1960 Referring Provider:        CARDIAC REHAB PHASE II ORIENTATION from 11/12/2015 in [facility] CARD[facility]AC REH[facility]   Referring Provider  Armanda Magic MD      Initial Encounter Date:       CARDIAC REHAB PHASE II ORIENTATION from 11/12/2015 in [facility] CARDIAC REHAB   Date  11/12/15   Referring Provider  Armanda Magic MD      Visit Diagnosis: NSTEMI (non-ST elevated myocardial infarction) [facility])  Stented coronary artery  Patient's Home Medications on Admission:  Current outpatient prescriptions:  .  AMBULATORY NON FORMULARY MEDICATION, Take 90 mg by mouth 2 (two) times daily. Medication Name: Brilinta 90 mg BID (TWILIGHT Research Study PROVIDED), Disp: , Rfl:  .  AMBULATORY NON FORMULARY MEDICATION, Take 81 mg by mouth daily. Medication Name: ASA 81 mg Daily (TWILIGHT Research Study Provided), Disp: , Rfl:  .  B Complex-Biotin-FA (SUPER B-COMPLEX) TABS, Take 1 tablet by mouth daily., Disp: , Rfl:  .  cetirizine (ZYRTEC) 10 MG tablet, Take 10 mg by mouth as needed for allergies. , Disp: , Rfl:  .  fluticasone (FLONASE) 50 MCG/ACT nasal spray, Place 2 sprays into both nostrils as needed for allergies or rhinitis., Disp: , Rfl:  .  metoprolol tartrate (LOPRESSOR) 25 MG tablet, Take 1 tablet (25 mg total) by mouth 2 (two) times daily., Disp: 60 tablet, Rfl: 2 .  pravastatin (PRAVACHOL) 40 MG tablet, Take 1 tablet (40 mg total) by mouth daily., Disp: 30 tablet, Rfl: 1 .  nitroGLYCERIN (NITROSTAT) 0.4 MG SL tablet, Place 1 tablet (0.4 mg total) under the tongue every 5 (five) minutes as needed for chest pain. (Patient not taking: Reported on 11/12/2015), Disp: 25 tablet, Rfl: 3  Past Medical History: Past Medical History  Diagnosis Date  . Hypertension   . Hyperlipidemia   . Seasonal allergies   . Allergic rhinitis     /asthma  .  Hypercholesteremia   . ED (erectile dysfunction)   . 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 NSTEMI (non-ST elevated myocardial infarction) (HCC) 09/15/2015  . Childhood asthma   . Obstructive sleep apnea on CPAP   . Osteoarthritis     OA knees, shoulders; DDD lumbar spine  . Arthritis     "shoulders, back, knees" (09/16/2015)    Tobacco Use: History  Smoking status  . Former Smoker  Smokeless tobacco  . Never Used    Comment: "smoked 2 packs of cigaretes in my whole life"    Labs:     Recent Review Flowsheet Data    Labs for ITP Cardiac and Pulmonary Rehab Latest Ref Rng 09/15/2015 09/16/2015   Cholestrol 0 - 200 mg/dL 409 811   LDLCALC 0 - 99 mg/dL 914(N) 85   HDL >82 mg/dL 95(A) 21(H)   Trlycerides <150 mg/dL 086(V) 784      Capillary Blood Glucose: No results found for: GLUCAP   Exercise Target Goals: Date: 11/12/15  Exercise Program Goal: Individual exercise prescription set with THRR, safety & activity barriers. Participant demonstrates ability to understand and report RPE using BORG scale, to self-measure pulse accurately, and to acknowledge the importance of the exercise prescription.  Exercise Prescription Goal: Starting with aerobic activity 30 plus minutes a day, 3 days per week for initial exercise prescription. Provide home exercise prescription and guidelines that participant acknowledges understanding prior to discharge.  Activity Barriers &  Risk Stratification:     Activity Barriers & Cardiac Risk Stratification - 11/12/15 1424    Activity Barriers & Cardiac Risk Stratification   Activity Barriers Arthritis;Back Problems;Joint Problems  Arth in knees, back, shoulders; torn rotator cuff bilateral shoulder (r has been repaired)   Cardiac Risk Stratification High      6 Minute Walk:     6 Minute Walk      11/12/15 1531       6 Minute Walk   Phase Initial     Distance 1913 feet     Walk Time 6 minutes     # of Rest Breaks 0     MPH 3.62     METS  4.83     RPE 11     VO2 Peak 16.91     Symptoms No     Resting HR 72 bpm     Resting BP 126/82 mmHg     Max Ex. HR 115 bpm     Max Ex. BP 150/80 mmHg     2 Minute Post BP 120/76 mmHg        Initial Exercise Prescription:     Initial Exercise Prescription - 11/12/15 1500    Date of Initial Exercise RX and Referring Provider   Date 11/12/15   Referring Provider Armanda Magic MD   Treadmill   MPH 3   Grade 3   Minutes 10   METs 4.54   Bike   Level 2.3   Minutes 10   METs 4.77   NuStep   Level 3   Minutes 10   METs 3   Prescription Details   Frequency (times per week) 3   Duration Progress to 30 minutes of continuous aerobic without signs/symptoms of physical distress   Intensity   THRR 40-80% of Max Heartrate 84-134   Ratings of Perceived Exertion 11-13   Perceived Dyspnea 0-4   Progression   Progression Continue to progress workloads to maintain intensity without signs/symptoms of physical distress.   Resistance Training   Training Prescription Yes   Weight 3 lbs.   Reps 10-12      Perform Capillary Blood Glucose checks as needed.  Exercise Prescription Changes:   Exercise Comments:   Discharge Exercise Prescription (Final Exercise Prescription Changes):   Nutrition:  Target Goals: Understanding of nutrition guidelines, daily intake of sodium 1500mg , cholesterol 200mg , calories 30% from fat and 7% or less from saturated fats, daily to have 5 or more servings of fruits and vegetables.  Biometrics:     Pre Biometrics - 11/12/15 1532    Pre Biometrics   Height 5' 11.25" (1.81 m)   Weight 253 lb 4.9 oz (114.9 kg)   Waist Circumference 40.25 inches   Hip Circumference 41.25 inches   Waist to Hip Ratio 0.98 %   BMI (Calculated) 35.2   Triceps Skinfold 27 mm   % Body Fat 31.8 %   Grip Strength 59.5 kg   Flexibility 9.5 in   Single Leg Stand 30 seconds       Nutrition Therapy Plan and Nutrition Goals:   Nutrition Discharge: Nutrition  Scores:   Nutrition Goals Re-Evaluation:   Psychosocial: Target Goals: Acknowledge presence or absence of depression, maximize coping skills, provide positive support system. Participant is able to verbalize types and ability to use techniques and skills needed for reducing stress and depression.  Initial Review & Psychosocial Screening:   Quality of Life Scores:     Quality of Life -  11/12/15 1546    Quality of Life Scores   Health/Function Pre (p) 22.4 %   Socioeconomic Pre (p) 24 %   Psych/Spiritual Pre (p) 24 %   Family Pre (p) 27.6 %   GLOBAL Pre (p) 23.83 %      PHQ-9:     Recent Review Flowsheet Data    Depression screen [facility] 2/9 01/11/2015   Decreased Interest 0   Down, Depressed, Hopeless 0   PHQ - 2 Score 0      Psychosocial Evaluation and Intervention:   Psychosocial Re-Evaluation:   Vocational Rehabilitation: Provide vocational rehab assistance to qualifying candidates.   Vocational Rehab Evaluation & Intervention:   Education: Education Goals: Education classes will be provided on a weekly basis, covering required topics. Participant will state understanding/return demonstration of topics presented.  Learning Barriers/Preferences:     Learning Barriers/Preferences - 11/12/15 1540    Learning Barriers/Preferences   Learning Barriers None   Learning Preferences Audio;Verbal Instruction;Written Material;Group Instruction;Individual Instruction      Education Topics: Count Your Pulse:  -Group instruction provided by verbal instruction, demonstration, patient participation and written materials to support subject.  Instructors address importance of being able to find your pulse and how to count your pulse when at home without a heart monitor.  Patients get hands on experience counting their pulse with staff help and individually.   Heart Attack, Angina, and Risk Factor Modification:  -Group instruction provided by verbal instruction, video, and  written materials to support subject.  Instructors address signs and symptoms of angina and heart attacks.    Also discuss risk factors for heart disease and how to make changes to improve heart health risk factors.   Functional Fitness:  -Group instruction provided by verbal instruction, demonstration, patient participation, and written materials to support subject.  Instructors address safety measures for doing things around the house.  Discuss how to get up and down off the floor, how to pick things up properly, how to safely get out of a chair without assistance, and balance training.   Meditation and Mindfulness:  -Group instruction provided by verbal instruction, patient participation, and written materials to support subject.  Instructor addresses importance of mindfulness and meditation practice to help reduce stress and improve awareness.  Instructor also leads participants through a meditation exercise.    Stretching for Flexibility and Mobility:  -Group instruction provided by verbal instruction, patient participation, and written materials to support subject.  Instructors lead participants through series of stretches that are designed to increase flexibility thus improving mobility.  These stretches are additional exercise for major muscle groups that are typically performed during regular warm up and cool down.   Hands Only CPR Anytime:  -Group instruction provided by verbal instruction, video, patient participation and written materials to support subject.  Instructors co-teach with AHA video for hands only CPR.  Participants get hands on experience with mannequins.   Nutrition I class: Heart Healthy Eating:  -Group instruction provided by PowerPoint slides, verbal discussion, and written materials to support subject matter. The instructor gives an explanation and review of the Therapeutic Lifestyle Changes diet recommendations, which includes a discussion on lipid goals, dietary  fat, sodium, fiber, plant stanol/sterol esters, sugar, and the components of a well-balanced, healthy diet.   Nutrition II class: Lifestyle Skills:  -Group instruction provided by PowerPoint slides, verbal discussion, and written materials to support subject matter. The instructor gives an explanation and review of label reading, grocery shopping for heart health, heart healthy  recipe modifications, and ways to make healthier choices when eating out.   Diabetes Question & Answer:  -Group instruction provided by PowerPoint slides, verbal discussion, and written materials to support subject matter. The instructor gives an explanation and review of diabetes co-morbidities, pre- and post-prandial blood glucose goals, pre-exercise blood glucose goals, signs, symptoms, and treatment of hypoglycemia and hyperglycemia, and foot care basics.   Diabetes Blitz:  -Group instruction provided by PowerPoint slides, verbal discussion, and written materials to support subject matter. The instructor gives an explanation and review of the physiology behind type 1 and type 2 diabetes, diabetes medications and rational behind using different medications, pre- and post-prandial blood glucose recommendations and Hemoglobin A1c goals, diabetes diet, and exercise including blood glucose guidelines for exercising safely.    Portion Distortion:  -Group instruction provided by PowerPoint slides, verbal discussion, written materials, and food models to support subject matter. The instructor gives an explanation of serving size versus portion size, changes in portions sizes over the last 20 years, and what consists of a serving from each food group.   Stress Management:  -Group instruction provided by verbal instruction, video, and written materials to support subject matter.  Instructors review role of stress in heart disease and how to cope with stress positively.     Exercising on Your Own:  -Group instruction provided  by verbal instruction, power point, and written materials to support subject.  Instructors discuss benefits of exercise, components of exercise, frequency and intensity of exercise, and end points for exercise.  Also discuss use of nitroglycerin and activating EMS.  Review options of places to exercise outside of rehab.  Review guidelines for sex with heart disease.   Cardiac Drugs I:  -Group instruction provided by verbal instruction and written materials to support subject.  Instructor reviews cardiac drug classes: antiplatelets, anticoagulants, beta blockers, and statins.  Instructor discusses reasons, side effects, and lifestyle considerations for each drug class.   Cardiac Drugs II:  -Group instruction provided by verbal instruction and written materials to support subject.  Instructor reviews cardiac drug classes: angiotensin converting enzyme inhibitors (ACE-I), angiotensin II receptor blockers (ARBs), nitrates, and calcium channel blockers.  Instructor discusses reasons, side effects, and lifestyle considerations for each drug class.   Anatomy and Physiology of the Circulatory System:  -Group instruction provided by verbal instruction, video, and written materials to support subject.  Reviews functional anatomy of heart, how it relates to various diagnoses, and what role the heart plays in the overall system.   Knowledge Questionnaire Score:     Knowledge Questionnaire Score - 11/12/15 1541    Knowledge Questionnaire Score   Pre Score 22/24      Core Components/Risk Factors/Patient Goals at Admission:     Personal Goals and Risk Factors at Admission - 11/12/15 1543    Core Components/Risk Factors/Patient Goals on Admission   Admit Weight 253 lb 4.9 oz (114.9 kg)   Goal Weight: Short Term 247 lb (112.038 kg)   Hypertension Yes   Intervention Provide education on lifestyle modifcations including regular physical activity/exercise, weight management, moderate sodium restriction  and increased consumption of fresh fruit, vegetables, and low fat dairy, alcohol moderation, and smoking cessation.;Monitor prescription use compliance.   Expected Outcomes Short Term: Continued assessment and intervention until BP is < 140/31mm HG in hypertensive participants. < 130/74mm HG in hypertensive participants with diabetes, heart failure or chronic kidney disease.;Long Term: Maintenance of blood pressure at goal levels.   Lipids Yes   Intervention Provide education  and support for participant on nutrition & aerobic/resistive exercise along with prescribed medications to achieve LDL 70mg , HDL >40mg .   Expected Outcomes Short Term: Participant states understanding of desired cholesterol values and is compliant with medications prescribed. Participant is following exercise prescription and nutrition guidelines.;Long Term: Cholesterol controlled with medications as prescribed, with individualized exercise RX and with personalized nutrition plan. Value goals: LDL < 70mg , HDL > 40 mg.      Core Components/Risk Factors/Patient Goals Review:    Core Components/Risk Factors/Patient Goals at Discharge (Final Review):    ITP Comments:     ITP Comments      11/12/15 1355           ITP Comments Dr. Armanda Magicraci Turner, Medical Director           Comments: Patient attended orientation from 1330 to 1530 to review rules and guidelines for program. Completed 6 minute walk test, Intitial ITP, and exercise prescription.  VSS. Telemetry-sinus rhythm, rare PVC.  Asymptomatic.

## 2015-11-18 ENCOUNTER — Encounter (HOSPITAL_COMMUNITY): Payer: 59

## 2015-11-20 ENCOUNTER — Encounter (HOSPITAL_COMMUNITY)
Admission: RE | Admit: 2015-11-20 | Discharge: 2015-11-20 | Disposition: A | Discharge status: Home / Self Care | Payer: 59 | Source: Ambulatory Visit | Attending: Cardiology | Admitting: Cardiology

## 2015-11-20 ENCOUNTER — Other Ambulatory Visit: Payer: Self-pay | Admitting: *Deleted

## 2015-11-20 DIAGNOSIS — I214 Non-ST elevation (NSTEMI) myocardial infarction: Secondary | ICD-10-CM

## 2015-11-20 DIAGNOSIS — Z955 Presence of coronary angioplasty implant and graft: Secondary | ICD-10-CM

## 2015-11-20 MED ORDER — AMBULATORY NON FORMULARY MEDICATION: 90.0000 mg | Freq: Two times a day (BID) | Status: DC

## 2015-11-20 NOTE — Progress Notes (Signed)
Daily Session Note  Patient Details  Name: Raymond Jordan MRN: [id number] Date of Birth: [birth year] Referring Provider:        [place] from 11/12/2015 in [place]   Referring Provider  Fransico Him MD      Encounter Date: 11/20/2015  Check In:     Session Check In - 11/20/15 1559    Check-In   Location [facility]   Staff Present Maurice Small, RN, Deland Pretty, MS, ACSM CEP, Exercise Physiologist;Joann Rion, RN, BSN;Amber Fair, MS, ACSM RCEP, Exercise Physiologist;Maria Whitaker, RN, BSN   Supervising physician immediately available to respond to emergencies [facility] immediately available   Physician(s) Dr> Marily Memos   Medication changes reported     No   Fall or balance concerns reported    No   Warm-up and Cool-down Performed as group-led Location manager Performed No   VAD Patient? No   Pain Assessment   Currently in Pain? No/denies   Multiple Pain Sites No      Capillary Blood Glucose: No results found for this or any previous visit (from the past 24 hour(s)).   Goals Met:  Exercise tolerated well  Goals Unmet:  Not Applicable  Comments: Pt started cardiac rehab today.  Pt tolerated light exercise without difficulty. VSS, telemetry-Sinus rhythm , asymptomatic.  Medication list reconciled. Pt denies barriers to medicaiton compliance.  PSYCHOSOCIAL ASSESSMENT:  PHQ-0. Pt exhibits positive coping skills, hopeful outlook with supportive family. No psychosocial needs identified at this time, no psychosocial interventions necessary.    Pt enjoys travelling  And doing yard work.   Pt oriented to exercise equipment and routine.    Understanding verbalized.   Dr. Fransico Him is Medical Director for Cardiac Rehab at [facility].

## 2015-11-22 ENCOUNTER — Encounter (HOSPITAL_COMMUNITY): Payer: 59

## 2015-11-22 ENCOUNTER — Telehealth (HOSPITAL_COMMUNITY): Payer: Self-pay | Admitting: Family Medicine

## 2015-11-25 ENCOUNTER — Encounter (HOSPITAL_COMMUNITY)
Admission: RE | Admit: 2015-11-25 | Discharge: 2015-11-25 | Disposition: A | Discharge status: Home / Self Care | Payer: 59 | Source: Ambulatory Visit | Attending: Cardiology | Admitting: Cardiology

## 2015-11-25 DIAGNOSIS — I214 Non-ST elevation (NSTEMI) myocardial infarction: Secondary | ICD-10-CM | POA: Diagnosis not present

## 2015-11-25 NOTE — Progress Notes (Signed)
Patient reported feeling some chest heaviness yesterday while walking up an incline outside yesterday. Raymond Jordan said he experienced some discomfort in his left chest area and left upper shoulder for just a few minutes. Raymond Jordan denies having any discomfort or chest pain today at cardiac rehab.Vital sign stable. Telemetry rhythm sinus. Nada BoozerLaura Ingold FNP-C called and notified. Per Vernona RiegerLaura, patient instructed to call Dr Norris Crossurner's office if he experiences any more pain. Patient states understanding. Will continue to monitor the patient throughout  the program.

## 2015-11-27 ENCOUNTER — Encounter (HOSPITAL_COMMUNITY)
Admission: RE | Admit: 2015-11-27 | Discharge: 2015-11-27 | Disposition: A | Discharge status: Home / Self Care | Payer: 59 | Source: Ambulatory Visit | Attending: Cardiology | Admitting: Cardiology

## 2015-11-27 DIAGNOSIS — I214 Non-ST elevation (NSTEMI) myocardial infarction: Secondary | ICD-10-CM | POA: Diagnosis not present

## 2015-11-27 DIAGNOSIS — Z955 Presence of coronary angioplasty implant and graft: Secondary | ICD-10-CM

## 2015-11-28 NOTE — Progress Notes (Signed)
Cardiac Individual Treatment Plan  Patient Details  Name: Raymond Jordan MRN: [id number] Date of Birth: 07/26/1960 Referring Provider:        CARDIAC REHAB PHASE II ORIENTATION from 11/12/2015 in [facility] CARDIAC REHAB   Referring Provider  Armanda Magic MD      Initial Encounter Date:       CARDIAC REHAB PHASE II ORIENTATION from 11/12/2015 in [facility] CARDIAC REHAB   Date  11/12/15   Referring Provider  Armanda Magic MD      Visit Diagnosis: NSTEMI (non-ST elevated myocardial infarction) [facility])  Stented coronary artery  Patient's Home Medications on Admission:  Current outpatient prescriptions:  .  AMBULATORY NON FORMULARY MEDICATION, Take 81 mg by mouth daily. Medication Name: ASA 81 mg Daily (TWILIGHT Research Study Provided), Disp: , Rfl:  .  AMBULATORY NON FORMULARY MEDICATION, Take 90 mg by mouth 2 (two) times daily. Medication Name: BRILINTA 90 mg BID (TWILIGHT Research study provided do not fill), Disp: , Rfl:  .  B Complex-Biotin-FA (SUPER B-COMPLEX) TABS, Take 1 tablet by mouth daily., Disp: , Rfl:  .  cetirizine (ZYRTEC) 10 MG tablet, Take 10 mg by mouth as needed for allergies. , Disp: , Rfl:  .  fluticasone (FLONASE) 50 MCG/ACT nasal spray, Place 2 sprays into both nostrils as needed for allergies or rhinitis., Disp: , Rfl:  .  metoprolol tartrate (LOPRESSOR) 25 MG tablet, Take 1 tablet (25 mg total) by mouth 2 (two) times daily., Disp: 60 tablet, Rfl: 2 .  nitroGLYCERIN (NITROSTAT) 0.4 MG SL tablet, Place 1 tablet (0.4 mg total) under the tongue every 5 (five) minutes as needed for chest pain., Disp: 25 tablet, Rfl: 3 .  pravastatin (PRAVACHOL) 40 MG tablet, Take 1 tablet (40 mg total) by mouth daily., Disp: 30 tablet, Rfl: 1  Past Medical History: Past Medical History  Diagnosis Date  . Hypertension   . Hyperlipidemia   . Seasonal allergies   . Allergic rhinitis     /asthma  . Hypercholesteremia   . ED (erectile  dysfunction)   . 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 NSTEMI (non-ST elevated myocardial infarction) (HCC) 09/15/2015  . Childhood asthma   . Obstructive sleep apnea on CPAP   . Osteoarthritis     OA knees, shoulders; DDD lumbar spine  . Arthritis     "shoulders, back, knees" (09/16/2015)    Tobacco Use: History  Smoking status  . Former Smoker  Smokeless tobacco  . Never Used    Comment: "smoked 2 packs of cigaretes in my whole life"    Labs: Recent Review Flowsheet Data    Labs for ITP Cardiac and Pulmonary Rehab Latest Ref Rng 09/15/2015 09/16/2015   Cholestrol 0 - 200 mg/dL 409 811   LDLCALC 0 - 99 mg/dL 914(N) 85   HDL >82 mg/dL 95(A) 21(H)   Trlycerides <150 mg/dL 086(V) 784      Capillary Blood Glucose: No results found for: GLUCAP   Exercise Target Goals:    Exercise Program Goal: Individual exercise prescription set with THRR, safety & activity barriers. Participant demonstrates ability to understand and report RPE using BORG scale, to self-measure pulse accurately, and to acknowledge the importance of the exercise prescription.  Exercise Prescription Goal: Starting with aerobic activity 30 plus minutes a day, 3 days per week for initial exercise prescription. Provide home exercise prescription and guidelines that participant acknowledges understanding prior to discharge.  Activity Barriers & Risk Stratification:     Activity  Barriers & Cardiac Risk Stratification - 11/12/15 1424    Activity Barriers & Cardiac Risk Stratification   Activity Barriers Arthritis;Back Problems;Joint Problems  Arth in knees, back, shoulders; torn rotator cuff bilateral shoulder (r has been repaired)   Cardiac Risk Stratification High      6 Minute Walk:     6 Minute Walk      11/12/15 1531       6 Minute Walk   Phase Initial     Distance 1913 feet     Walk Time 6 minutes     # of Rest Breaks 0     MPH 3.62     METS 4.83     RPE 11     VO2 Peak 16.91     Symptoms No      Resting HR 72 bpm     Resting BP 126/82 mmHg     Max Ex. HR 115 bpm     Max Ex. BP 150/80 mmHg     2 Minute Post BP 120/76 mmHg        Initial Exercise Prescription:     Initial Exercise Prescription - 11/12/15 1500    Date of Initial Exercise RX and Referring Provider   Date 11/12/15   Referring Provider Armanda Magic MD   Treadmill   MPH 3   Grade 3   Minutes 10   METs 4.54   Bike   Level 2.3   Minutes 10   METs 4.77   NuStep   Level 3   Minutes 10   METs 3   Prescription Details   Frequency (times per week) 3   Duration Progress to 30 minutes of continuous aerobic without signs/symptoms of physical distress   Intensity   THRR 40-80% of Max Heartrate 84-134   Ratings of Perceived Exertion 11-13   Perceived Dyspnea 0-4   Progression   Progression Continue to progress workloads to maintain intensity without signs/symptoms of physical distress.   Resistance Training   Training Prescription Yes   Weight 3 lbs.   Reps 10-12      Perform Capillary Blood Glucose checks as needed.  Exercise Prescription Changes:     Exercise Prescription Changes      11/20/15 1445           Response to Exercise   Blood Pressure (Admit) 130/84 mmHg       Blood Pressure (Exercise) 160/80 mmHg       Blood Pressure (Exit) 138/78 mmHg       Heart Rate (Admit) 97 bpm       Heart Rate (Exercise) 143 bpm       Heart Rate (Exit) 95 bpm       Perceived Dyspnea (Exercise) 11       Symptoms none       Duration Progress to 30 minutes of continuous aerobic without signs/symptoms of physical distress       Intensity THRR unchanged       Progression   Progression Continue to progress workloads to maintain intensity without signs/symptoms of physical distress.       Average METs 3.8       Resistance Training   Training Prescription Yes       Weight 3 lbs.       Reps 10-12       Interval Training   Interval Training No       Treadmill   MPH 3       Grade 3  Minutes 10        METs 4.54       Bike   Level 1.3       Minutes 10       METs 3.18       NuStep   Level 1       Minutes 10       METs 2          Exercise Comments:     Exercise Comments      11/25/15 0831           Exercise Comments Pt off to a good start with exercise.          Discharge Exercise Prescription (Final Exercise Prescription Changes):     Exercise Prescription Changes - 11/20/15 1445    Response to Exercise   Blood Pressure (Admit) 130/84 mmHg   Blood Pressure (Exercise) 160/80 mmHg   Blood Pressure (Exit) 138/78 mmHg   Heart Rate (Admit) 97 bpm   Heart Rate (Exercise) 143 bpm   Heart Rate (Exit) 95 bpm   Perceived Dyspnea (Exercise) 11   Symptoms none   Duration Progress to 30 minutes of continuous aerobic without signs/symptoms of physical distress   Intensity THRR unchanged   Progression   Progression Continue to progress workloads to maintain intensity without signs/symptoms of physical distress.   Average METs 3.8   Resistance Training   Training Prescription Yes   Weight 3 lbs.   Reps 10-12   Interval Training   Interval Training No   Treadmill   MPH 3   Grade 3   Minutes 10   METs 4.54   Bike   Level 1.3   Minutes 10   METs 3.18   NuStep   Level 1   Minutes 10   METs 2      Nutrition:  Target Goals: Understanding of nutrition guidelines, daily intake of sodium 1500mg , cholesterol 200mg , calories 30% from fat and 7% or less from saturated fats, daily to have 5 or more servings of fruits and vegetables.  Biometrics:     Pre Biometrics - 11/12/15 1532    Pre Biometrics   Height 5' 11.25" (1.81 m)   Weight 253 lb 4.9 oz (114.9 kg)   Waist Circumference 40.25 inches   Hip Circumference 41.25 inches   Waist to Hip Ratio 0.98 %   BMI (Calculated) 35.2   Triceps Skinfold 27 mm   % Body Fat 31.8 %   Grip Strength 59.5 kg   Flexibility 9.5 in   Single Leg Stand 30 seconds       Nutrition Therapy Plan and Nutrition Goals:      Nutrition Therapy & Goals - 11/14/15 1352    Nutrition Therapy   Diet Therapeutic Lifestyle Changes   Personal Nutrition Goals   Personal Goal #1 0.5-2 lb wt loss per week to a goal wt loss of 6-24 lb at graduation from Cardiac Rehab   Intervention Plan   Intervention Prescribe, educate and counsel regarding individualized specific dietary modifications aiming towards targeted core components such as weight, hypertension, lipid management, diabetes, heart failure and other comorbidities.   Expected Outcomes Short Term Goal: Understand basic principles of dietary content, such as calories, fat, sodium, cholesterol and nutrients.;Long Term Goal: Adherence to prescribed nutrition plan.      Nutrition Discharge: Nutrition Scores:     Nutrition Assessments - 11/14/15 1334    MEDFICTS Scores   Pre Score 18  will verify score with  pt      Nutrition Goals Re-Evaluation:   Psychosocial: Target Goals: Acknowledge presence or absence of depression, maximize coping skills, provide positive support system. Participant is able to verbalize types and ability to use techniques and skills needed for reducing stress and depression.  Initial Review & Psychosocial Screening:     Initial Psych Review & Screening - 11/14/15 1017    Family Dynamics   Good Support System? Yes   Barriers   Psychosocial barriers to participate in program There are no identifiable barriers or psychosocial needs.   Screening Interventions   Interventions Encouraged to exercise      Quality of Life Scores:     Quality of Life - 11/12/15 1546    Quality of Life Scores   Health/Function Pre (p) 22.4 %   Socioeconomic Pre (p) 24 %   Psych/Spiritual Pre (p) 24 %   Family Pre (p) 27.6 %   GLOBAL Pre (p) 23.83 %      PHQ-9:     Recent Review Flowsheet Data    Depression screen [facility] 2/9 11/20/2015 01/11/2015   Decreased Interest 0 0   Down, Depressed, Hopeless 0 0   PHQ - 2 Score 0 0      Psychosocial  Evaluation and Intervention:   Psychosocial Re-Evaluation:     Psychosocial Re-Evaluation      11/28/15 1405           Psychosocial Re-Evaluation   Interventions Encouraged to attend Cardiac Rehabilitation for the exercise       Continued Psychosocial Services Needed No          Vocational Rehabilitation: Provide vocational rehab assistance to qualifying candidates.   Vocational Rehab Evaluation & Intervention:     Vocational Rehab - 11/13/15 1656    Initial Vocational Rehab Evaluation & Intervention   Assessment shows need for Vocational Rehabilitation No      Education: Education Goals: Education classes will be provided on a weekly basis, covering required topics. Participant will state understanding/return demonstration of topics presented.  Learning Barriers/Preferences:     Learning Barriers/Preferences - 11/12/15 1540    Learning Barriers/Preferences   Learning Barriers None   Learning Preferences Audio;Verbal Instruction;Written Material;Group Instruction;Individual Instruction      Education Topics: Count Your Pulse:  -Group instruction provided by verbal instruction, demonstration, patient participation and written materials to support subject.  Instructors address importance of being able to find your pulse and how to count your pulse when at home without a heart monitor.  Patients get hands on experience counting their pulse with staff help and individually.   Heart Attack, Angina, and Risk Factor Modification:  -Group instruction provided by verbal instruction, video, and written materials to support subject.  Instructors address signs and symptoms of angina and heart attacks.    Also discuss risk factors for heart disease and how to make changes to improve heart health risk factors.   Functional Fitness:  -Group instruction provided by verbal instruction, demonstration, patient participation, and written materials to support subject.  Instructors  address safety measures for doing things around the house.  Discuss how to get up and down off the floor, how to pick things up properly, how to safely get out of a chair without assistance, and balance training.   Meditation and Mindfulness:  -Group instruction provided by verbal instruction, patient participation, and written materials to support subject.  Instructor addresses importance of mindfulness and meditation practice to help reduce stress and improve awareness.  Instructor  also leads participants through a meditation exercise.    Stretching for Flexibility and Mobility:  -Group instruction provided by verbal instruction, patient participation, and written materials to support subject.  Instructors lead participants through series of stretches that are designed to increase flexibility thus improving mobility.  These stretches are additional exercise for major muscle groups that are typically performed during regular warm up and cool down.   Hands Only CPR Anytime:  -Group instruction provided by verbal instruction, video, patient participation and written materials to support subject.  Instructors co-teach with AHA video for hands only CPR.  Participants get hands on experience with mannequins.   Nutrition I class: Heart Healthy Eating:  -Group instruction provided by PowerPoint slides, verbal discussion, and written materials to support subject matter. The instructor gives an explanation and review of the Therapeutic Lifestyle Changes diet recommendations, which includes a discussion on lipid goals, dietary fat, sodium, fiber, plant stanol/sterol esters, sugar, and the components of a well-balanced, healthy diet.   Nutrition II class: Lifestyle Skills:  -Group instruction provided by PowerPoint slides, verbal discussion, and written materials to support subject matter. The instructor gives an explanation and review of label reading, grocery shopping for heart health, heart healthy  recipe modifications, and ways to make healthier choices when eating out.   Diabetes Question & Answer:  -Group instruction provided by PowerPoint slides, verbal discussion, and written materials to support subject matter. The instructor gives an explanation and review of diabetes co-morbidities, pre- and post-prandial blood glucose goals, pre-exercise blood glucose goals, signs, symptoms, and treatment of hypoglycemia and hyperglycemia, and foot care basics.   Diabetes Blitz:  -Group instruction provided by PowerPoint slides, verbal discussion, and written materials to support subject matter. The instructor gives an explanation and review of the physiology behind type 1 and type 2 diabetes, diabetes medications and rational behind using different medications, pre- and post-prandial blood glucose recommendations and Hemoglobin A1c goals, diabetes diet, and exercise including blood glucose guidelines for exercising safely.    Portion Distortion:  -Group instruction provided by PowerPoint slides, verbal discussion, written materials, and food models to support subject matter. The instructor gives an explanation of serving size versus portion size, changes in portions sizes over the last 20 years, and what consists of a serving from each food group.   Stress Management:  -Group instruction provided by verbal instruction, video, and written materials to support subject matter.  Instructors review role of stress in heart disease and how to cope with stress positively.     Exercising on Your Own:  -Group instruction provided by verbal instruction, power point, and written materials to support subject.  Instructors discuss benefits of exercise, components of exercise, frequency and intensity of exercise, and end points for exercise.  Also discuss use of nitroglycerin and activating EMS.  Review options of places to exercise outside of rehab.  Review guidelines for sex with heart disease.   Cardiac  Drugs I:  -Group instruction provided by verbal instruction and written materials to support subject.  Instructor reviews cardiac drug classes: antiplatelets, anticoagulants, beta blockers, and statins.  Instructor discusses reasons, side effects, and lifestyle considerations for each drug class.   Cardiac Drugs II:  -Group instruction provided by verbal instruction and written materials to support subject.  Instructor reviews cardiac drug classes: angiotensin converting enzyme inhibitors (ACE-I), angiotensin II receptor blockers (ARBs), nitrates, and calcium channel blockers.  Instructor discusses reasons, side effects, and lifestyle considerations for each drug class.   Anatomy and Physiology of  the Circulatory System:  -Group instruction provided by verbal instruction, video, and written materials to support subject.  Reviews functional anatomy of heart, how it relates to various diagnoses, and what role the heart plays in the overall system.   Knowledge Questionnaire Score:     Knowledge Questionnaire Score - 11/12/15 1541    Knowledge Questionnaire Score   Pre Score 22/24      Core Components/Risk Factors/Patient Goals at Admission:     Personal Goals and Risk Factors at Admission - 11/12/15 1543    Core Components/Risk Factors/Patient Goals on Admission   Admit Weight 253 lb 4.9 oz (114.9 kg)   Goal Weight: Short Term 247 lb (112.038 kg)   Hypertension Yes   Intervention Provide education on lifestyle modifcations including regular physical activity/exercise, weight management, moderate sodium restriction and increased consumption of fresh fruit, vegetables, and low fat dairy, alcohol moderation, and smoking cessation.;Monitor prescription use compliance.   Expected Outcomes Short Term: Continued assessment and intervention until BP is < 140/66mm HG in hypertensive participants. < 130/10mm HG in hypertensive participants with diabetes, heart failure or chronic kidney disease.;Long  Term: Maintenance of blood pressure at goal levels.   Lipids Yes   Intervention Provide education and support for participant on nutrition & aerobic/resistive exercise along with prescribed medications to achieve LDL 70mg , HDL >40mg .   Expected Outcomes Short Term: Participant states understanding of desired cholesterol values and is compliant with medications prescribed. Participant is following exercise prescription and nutrition guidelines.;Long Term: Cholesterol controlled with medications as prescribed, with individualized exercise RX and with personalized nutrition plan. Value goals: LDL < , HDL > 40 mg.      Core Components/Risk Factors/Patient Goals Review:    Core Components/Risk Factors/Patient Goals at Discharge (Final Review):    ITP Comments:     ITP Comments      11/12/15 1355           ITP Comments Dr. Armanda Magic, Medical Director           Comments: Pt is making expected progress toward personal goals after completing 4 sessions. Recommend continued exercise and life style modification education including  stress management and relaxation techniques to decrease cardiac risk profile. Haadi may not complete the entire program due to work obligations.

## 2015-11-29 ENCOUNTER — Encounter (HOSPITAL_COMMUNITY): Admission: RE | Admit: 2015-11-29 | Payer: 59 | Source: Ambulatory Visit

## 2015-11-29 ENCOUNTER — Encounter (HOSPITAL_COMMUNITY): Payer: Self-pay | Admitting: *Deleted

## 2015-11-29 NOTE — Progress Notes (Signed)
Reviewed home exercise guidelines with patient on Wednesday 11/27/15 including endpoints, temperature precautions, target heart rate and rate of perceived exertion. Pt plans to walk, use elliptical and outdoor bike at home as his mode of home exercise. Pt voices understanding of instructions given. Artist Paislinty M Tymeka Privette, MS, ACSM CCEP

## 2015-12-02 ENCOUNTER — Encounter (HOSPITAL_COMMUNITY): Payer: 59

## 2015-12-03 ENCOUNTER — Other Ambulatory Visit (INDEPENDENT_AMBULATORY_CARE_PROVIDER_SITE_OTHER): Payer: 59 | Admitting: *Deleted

## 2015-12-03 ENCOUNTER — Telehealth: Payer: Self-pay | Admitting: *Deleted

## 2015-12-03 DIAGNOSIS — E785 Hyperlipidemia, unspecified: Secondary | ICD-10-CM | POA: Diagnosis not present

## 2015-12-03 DIAGNOSIS — E7800 Pure hypercholesterolemia, unspecified: Secondary | ICD-10-CM

## 2015-12-03 DIAGNOSIS — I2510 Atherosclerotic heart disease of native coronary artery without angina pectoris: Secondary | ICD-10-CM

## 2015-12-03 LAB — LIPID PANEL
CHOLESTEROL: 150 mg/dL (ref 125–200)
HDL: 46 mg/dL (ref >=40)
LDL Cholesterol: 88 mg/dL (ref <130)
Total CHOL/HDL Ratio: 3.3 Ratio (ref <=5.0)
Triglycerides: 81 mg/dL (ref <150)
VLDL: 16 mg/dL (ref <30)

## 2015-12-03 LAB — HEPATIC FUNCTION PANEL
ALBUMIN: 3.9 g/dL (ref 3.6–5.1)
ALT: 9 U/L (ref 9–46)
AST: 14 U/L (ref 10–35)
Alkaline Phosphatase: 61 U/L (ref 40–115)
Bilirubin, Direct: 0.1 mg/dL (ref <=0.2)
Indirect Bilirubin: 0.3 mg/dL (ref 0.2–1.2)
TOTAL PROTEIN: 6.3 g/dL (ref 6.1–8.1)
Total Bilirubin: 0.4 mg/dL (ref 0.2–1.2)

## 2015-12-03 MED ORDER — PRAVASTATIN SODIUM 80 MG PO TABS: 80.0000 mg | ORAL_TABLET | Freq: Every day | ORAL | Status: DC

## 2015-12-03 NOTE — Telephone Encounter (Signed)
Pt has been notified of lab results and findings. pt is agreeable to increase Pravastatin to 80 mg QHS, LFT/FLP 03/06/16. New Rx sent in. Pt is agreeable to plan of care.

## 2015-12-03 NOTE — Addendum Note (Signed)
Addended by: Tonita PhoenixBOWDEN, Abrahm Mancia K on: 12/03/2015 08:27 AM   Modules accepted: Orders

## 2015-12-04 ENCOUNTER — Encounter (HOSPITAL_COMMUNITY)
Admission: RE | Admit: 2015-12-04 | Discharge: 2015-12-04 | Disposition: A | Discharge status: Home / Self Care | Payer: 59 | Source: Ambulatory Visit | Attending: Cardiology | Admitting: Cardiology

## 2015-12-04 DIAGNOSIS — I214 Non-ST elevation (NSTEMI) myocardial infarction: Secondary | ICD-10-CM | POA: Diagnosis not present

## 2015-12-04 DIAGNOSIS — Z955 Presence of coronary angioplasty implant and graft: Secondary | ICD-10-CM

## 2015-12-04 NOTE — Progress Notes (Signed)
Raymond Jordan 55 y.o. male Nutrition Note Spoke with pt.  Nutrition Survey reviewed with pt. Pt is following Step 2 of the Therapeutic Lifestyle Changes diet. Pt wants to lose wt. Pt reports wt 112.4 kg, which is down 2.5 kg since admission. Rate of wt loss appears safe.  Pt states he is preparing meals now which has increased healthy foods consumed. Pt expressed understanding of the information reviewed. Pt aware of nutrition education classes offered and is unable to attend nutrition classes due to his work schedule. No results found for: HGBA1C Wt Readings from Last 3 Encounters:  11/12/15 253 lb 4.9 oz (114.9 kg)  10/07/15 256 lb 12.8 oz (116.484 kg)  09/17/15 262 lb 5.6 oz (119 kg)   Nutrition Diagnosis ? Food-and nutrition-related knowledge deficit related to lack of exposure to information as related to diagnosis of: ? CVD ? Obesity related to excessive energy intake as evidenced by a BMI of 35.2 Nutrition Intervention ? Benefits of adopting Therapeutic Lifestyle Changes discussed when Medficts reviewed. ? Pt to attend the Portion Distortion class ? Pt given handouts for: ? Nutrition I class ? Nutrition II class ? Continue client-centered nutrition education by RD, as part of interdisciplinary care.  Goal(s) ? Pt to identify food quantities necessary to achieve weight loss of 6-24 lb (2.7-10.9 kg) at graduation from cardiac rehab.   Monitor and Evaluate progress toward nutrition goal with team.  Raymond Jordan, M.Ed, RD, LDN, CDE 12/04/2015 4:03 PM

## 2015-12-05 ENCOUNTER — Telehealth: Payer: Self-pay

## 2015-12-05 NOTE — Telephone Encounter (Signed)
Left message to call back  

## 2015-12-05 NOTE — Telephone Encounter (Signed)
Patient called in with questions regarding his pravastatin and would like a call back from nurse

## 2015-12-06 ENCOUNTER — Encounter (HOSPITAL_COMMUNITY)
Admission: RE | Admit: 2015-12-06 | Discharge: 2015-12-06 | Disposition: A | Discharge status: Home / Self Care | Payer: 59 | Source: Ambulatory Visit | Attending: Cardiology | Admitting: Cardiology

## 2015-12-06 ENCOUNTER — Other Ambulatory Visit: Payer: Self-pay | Admitting: *Deleted

## 2015-12-06 DIAGNOSIS — E7800 Pure hypercholesterolemia, unspecified: Secondary | ICD-10-CM | POA: Insufficient documentation

## 2015-12-06 DIAGNOSIS — I214 Non-ST elevation (NSTEMI) myocardial infarction: Secondary | ICD-10-CM | POA: Insufficient documentation

## 2015-12-06 DIAGNOSIS — E785 Hyperlipidemia, unspecified: Secondary | ICD-10-CM | POA: Insufficient documentation

## 2015-12-06 DIAGNOSIS — I10 Essential (primary) hypertension: Secondary | ICD-10-CM | POA: Insufficient documentation

## 2015-12-06 DIAGNOSIS — Z955 Presence of coronary angioplasty implant and graft: Secondary | ICD-10-CM

## 2015-12-06 DIAGNOSIS — Z79899 Other long term (current) drug therapy: Secondary | ICD-10-CM | POA: Insufficient documentation

## 2015-12-06 MED ORDER — PRAVASTATIN SODIUM 80 MG PO TABS
80.0000 mg | ORAL_TABLET | Freq: Every day | ORAL | Status: DC
Start: 2015-12-06 — End: 2016-08-30

## 2015-12-06 NOTE — Telephone Encounter (Signed)
[facility]

## 2015-12-09 ENCOUNTER — Encounter (HOSPITAL_COMMUNITY)
Admission: RE | Admit: 2015-12-09 | Discharge: 2015-12-09 | Disposition: A | Discharge status: Home / Self Care | Payer: 59 | Source: Ambulatory Visit | Attending: Cardiology | Admitting: Cardiology

## 2015-12-09 DIAGNOSIS — I214 Non-ST elevation (NSTEMI) myocardial infarction: Secondary | ICD-10-CM | POA: Diagnosis not present

## 2015-12-09 DIAGNOSIS — Z955 Presence of coronary angioplasty implant and graft: Secondary | ICD-10-CM

## 2015-12-11 ENCOUNTER — Encounter (HOSPITAL_COMMUNITY)
Admission: RE | Admit: 2015-12-11 | Discharge: 2015-12-11 | Disposition: A | Discharge status: Home / Self Care | Payer: 59 | Source: Ambulatory Visit | Attending: Cardiology | Admitting: Cardiology

## 2015-12-11 DIAGNOSIS — I214 Non-ST elevation (NSTEMI) myocardial infarction: Secondary | ICD-10-CM

## 2015-12-11 DIAGNOSIS — Z955 Presence of coronary angioplasty implant and graft: Secondary | ICD-10-CM

## 2015-12-13 ENCOUNTER — Ambulatory Visit (INDEPENDENT_AMBULATORY_CARE_PROVIDER_SITE_OTHER): Payer: 59 | Admitting: Cardiology

## 2015-12-13 ENCOUNTER — Other Ambulatory Visit: Payer: Self-pay

## 2015-12-13 ENCOUNTER — Encounter: Payer: Self-pay | Admitting: Cardiology

## 2015-12-13 ENCOUNTER — Encounter (HOSPITAL_COMMUNITY): Payer: 59

## 2015-12-13 VITALS — BP 126/72 | HR 76 | Ht 71.0 in | Wt 249.4 lb

## 2015-12-13 DIAGNOSIS — I10 Essential (primary) hypertension: Secondary | ICD-10-CM

## 2015-12-13 DIAGNOSIS — Z9989 Dependence on other enabling machines and devices: Principal | ICD-10-CM

## 2015-12-13 DIAGNOSIS — E669 Obesity, unspecified: Secondary | ICD-10-CM | POA: Diagnosis not present

## 2015-12-13 DIAGNOSIS — G4733 Obstructive sleep apnea (adult) (pediatric): Secondary | ICD-10-CM

## 2015-12-13 DIAGNOSIS — E7800 Pure hypercholesterolemia, unspecified: Secondary | ICD-10-CM | POA: Diagnosis not present

## 2015-12-13 DIAGNOSIS — I2510 Atherosclerotic heart disease of native coronary artery without angina pectoris: Secondary | ICD-10-CM | POA: Insufficient documentation

## 2015-12-13 NOTE — Patient Instructions (Signed)
Medication Instructions:  Your physician recommends that you continue on your current medications as directed. Please refer to the Current Medication list given to you today.   Labwork: Your physician recommends that you return for FASTING lab work in: 5 WEEKS.   Testing/Procedures: None  Follow-Up: Your physician wants you to follow-up in: 6 months with Dr. Mayford Knifeurner. You will receive a reminder letter in the mail two months in advance. If you don't receive a letter, please call our office to schedule the follow-up appointment.   Any Other Special Instructions Will Be Listed Below (If Applicable).     If you need a refill on your cardiac medications before your next appointment, please call your pharmacy.

## 2015-12-13 NOTE — Progress Notes (Signed)
Cardiology Office Note    Date:  12/13/2015   ID:  [name], DOB [birth year], MRN [id number]  PCP:  Hollice Espy, MD  Cardiologist:  Armanda Magic, MD   Chief Complaint  Patient presents with  . Coronary Artery Disease  . Hypertension  . Sleep Apnea  . Hyperlipidemia    History of Present Illness:  Raymond Jordan is a 55 y.o. male with a history of HTN, OS, obesity, ASCAD s/p recent non-STEMI with cath showing 99% prox RCA s/p DES (now on DAPT) with normal LVF who presents today for followup. He is doing well with his CPAP therapy. He tolerates his nasal pillow mask and feels the pressure is adequate. He sleeps well at night. He has no daytime sleepiness and feels rested when he gets up in the am.He does not nap any during the day.   He does not snore if he uses his CPAP. He denies any nasal congestion or mouth dryness when using the CPAP.  He denies any am headaches.  He denies any chest pain, SOB, DOE, LE edema, dizziness, palpitations or syncope.  He denies any claudication.      Past Medical History  Diagnosis Date  . Hypertension   . Seasonal allergies   . Allergic rhinitis     /asthma  . Hypercholesteremia   . ED (erectile dysfunction)   . 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 Childhood asthma   . Obstructive sleep apnea on CPAP   . Osteoarthritis     OA knees, shoulders; DDD lumbar spine  . Arthritis     "shoulders, back, knees" (09/16/2015)  . CAD (coronary artery disease), native coronary artery 09/15/2015    s/p NSTEMI 09/2015 with cath showing 99% RCA s/p DES now on DAPT  . Hyperlipidemia LDL goal <70     Past Surgical History  Procedure Laterality Date  . Ankle surgery  1985    bone spur removal ankle.  . Nasal sinus surgery  ~ 2007    "& fixed my septum"  . Shoulder open rotator cuff repair Right 10/2013  . Myringotomy with tube placement Left ~ 07/2009  . Vasectomy    . Wisdom tooth extraction  ~ 1992  . Cardiac catheterization N/A 09/16/2015   Procedure: Left Heart Cath and Coronary Angiography;  Surgeon: Kathleene Hazel, MD;  Location: [facility] INVASIVE CV LAB;  Service: Cardiovascular;  Laterality: N/A;  . Cardiac catheterization N/A 09/16/2015    Procedure: Coronary Stent Intervention;  Surgeon: Kathleene Hazel, MD;  Location: [facility] INVASIVE CV LAB;  Service: Cardiovascular;  Laterality: N/A;    Current Medications: Outpatient Prescriptions Prior to Visit  Medication Sig Dispense Refill  . AMBULATORY NON FORMULARY MEDICATION Take 81 mg by mouth daily. Medication Name: ASA 81 mg Daily (TWIL[place] Research Study Provided)    . AMBULATORY NON FORMULARY MEDICATION Take 90 mg by mouth 2 (two) times daily. Medication Name: BRILINTA 90 mg BID (TWILIGHT Research study provided do not fill)    . B Complex-Biotin-FA (SUPER B-COMPLEX) TABS Take 1 tablet by mouth daily.    . cetirizine (ZYRTEC) 10 MG tablet Take 10 mg by mouth daily as needed for allergies.     . fluticasone (FLONASE) 50 MCG/ACT nasal spray Place 2 sprays into both nostrils as needed for allergies or rhinitis.    . metoprolol tartrate (LOPRESSOR) 25 MG tablet Take 1 tablet (25 mg total) by mouth 2 (two) times daily. 60 tablet 2  . nitroGLYCERIN (NITROSTAT) 0.4 MG SL  tablet Place 1 tablet (0.4 mg total) under the tongue every 5 (five) minutes as needed for chest pain. 25 tablet 3  . pravastatin (PRAVACHOL) 80 MG tablet Take 1 tablet (80 mg total) by mouth daily. 90 tablet 3   No facility-administered medications prior to visit.     Allergies:   Crestor and Lipitor   Social History   Social History  . Marital Status: Married    Spouse Name: N/A  . Number of Children: 2  . Years of Education: N/A   Occupational History  . Truck driver    Social History Main Topics  . Smoking status: Former Games developer  . Smokeless tobacco: Never Used     Comment: "smoked 2 packs of cigaretes in my whole life"  . Alcohol Use: Yes     Comment: 09/16/2015 "might have a couple drinks q  couple months"  . Drug Use: No  . Sexual Activity: Yes   Other Topics Concern  . None   Social History Narrative   Marital status: Married x 30 years      Children:  2 children; 1 stepchild; grandchild and two step grandchildren.      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      Tobacco:  No tobacco      Alcohol: + weekends sometimes; two drinks per month.      Drugs: none      Exercise:  Walking and cycling.     Family History:  The patient's family history includes Arthritis in his father; Asthma in his mother; Cancer in his father; Diabetes in his mother; Heart attack in his father; Heart disease in his father; Sarcoidosis in his mother; Stroke in his father and mother.   ROS:   Please see the history of present illness.    ROS All other systems reviewed and are negative.   PHYSICAL EXAM:   VS:  BP 126/72 mmHg  Pulse 76  Ht  (1.803 m)  Wt 249 lb 6.4 oz (113.127 kg)  BMI 34.80 kg/m2  SpO2 97%   GEN: Well nourished, well developed, in no acute distress HEENT: normal Neck: no JVD, carotid bruits, or masses Cardiac: RRR; no murmurs, rubs, or gallops,no edema.  Intact distal pulses bilaterally.  Respiratory:  clear to auscultation bilaterally, normal work of breathing GI: soft, nontender, nondistended, + BS MS: no deformity or atrophy Skin: warm and dry, no rash Neuro:  Alert and Oriented x 3, Strength and sensation are intact Psych: euthymic mood, full affect  Wt Readings from Last 3 Encounters:  12/13/15 249 lb 6.4 oz (113.127 kg)  11/12/15 253 lb 4.9 oz (114.9 kg)  10/07/15 256 lb 12.8 oz (116.484 kg)      Studies/Labs Reviewed:   EKG:  EKG is not ordered today.    Recent Labs: 09/14/2015: TSH 1.156 09/17/2015: BUN 14; Creatinine, Ser 1.06; Hemoglobin 12.0*; Platelets 126*; Potassium 3.8; Sodium 139 12/03/2015: ALT 9   Lipid Panel    Component Value Date/Time   CHOL 150  12/03/2015 0828   TRIG 81 12/03/2015 0828   HDL 46 12/03/2015 0828   CHOLHDL 3.3 12/03/2015 0828   VLDL 16 12/03/2015 0828   LDLCALC 88 12/03/2015 0828    Additional studies/ records that were reviewed today include:  CPAP download    ASSESSMENT:    1. OSA on CPAP   2. Essential hypertension, benign   3. Obesity  4. Pure hypercholesterolemia   5. Coronary artery disease involving native coronary artery of native heart without angina pectoris      PLAN:  In order of problems listed above:  OSA - the patient is tolerating PAP therapy well without any problems. The PAP download was reviewed today and showed an AHI of 0.5/hr on 11 cm H2O with 70% compliance in using more than 4 hours nightly.  The patient has been using and benefiting from CPAP use and will continue to benefit from therapy.  HTN - BP controlled on current medical regimen.  Continue BB. Obesity - I have encouraged him to get into a routine exercise program and cut back on carbs and portions.  Hyperlipidemia with LDL goal < 70.  His last LDL showed 88 and he just increased his pravastatin to 80mg  daily.  Recheck FLP and ALT in 6 weeks. ASCAD s/p NSTEMI with cath showing 99% prox RCA s/p DES with o/w nonobstructive CAD. Continue DAPT with ASA/Brilinta.  Continue statin and BB.  He is enrolled in the [place] study.      Medication Adjustments/Labs and Tests Ordered: Current medicines are reviewed at length with the patient today.  Concerns regarding medicines are outlined above.  Medication changes, Labs and Tests ordered today are listed in the Patient Instructions below.  There are no Patient Instructions on file for this visit.   Signed, Armanda Magicraci Turner, MD  12/13/2015 3:36 PM    [facility] [address], [place], [place]  [address] Phone: [phone]) [phone]; Fax: [phone]

## 2015-12-16 ENCOUNTER — Other Ambulatory Visit: Payer: Self-pay | Admitting: *Deleted

## 2015-12-16 ENCOUNTER — Encounter (HOSPITAL_COMMUNITY): Admission: RE | Admit: 2015-12-16 | Payer: 59 | Source: Ambulatory Visit

## 2015-12-16 MED ORDER — METOPROLOL TARTRATE 25 MG PO TABS: 25.0000 mg | ORAL_TABLET | Freq: Two times a day (BID) | ORAL | Status: DC

## 2015-12-17 NOTE — Telephone Encounter (Signed)
Patient was seen in office 6/9.

## 2015-12-18 ENCOUNTER — Encounter (HOSPITAL_COMMUNITY)
Admission: RE | Admit: 2015-12-18 | Discharge: 2015-12-18 | Disposition: A | Discharge status: Home / Self Care | Payer: 59 | Source: Ambulatory Visit | Attending: Cardiology | Admitting: Cardiology

## 2015-12-18 DIAGNOSIS — I214 Non-ST elevation (NSTEMI) myocardial infarction: Secondary | ICD-10-CM | POA: Diagnosis not present

## 2015-12-18 DIAGNOSIS — Z955 Presence of coronary angioplasty implant and graft: Secondary | ICD-10-CM

## 2015-12-20 ENCOUNTER — Encounter (HOSPITAL_COMMUNITY)
Admission: RE | Admit: 2015-12-20 | Discharge: 2015-12-20 | Disposition: A | Discharge status: Home / Self Care | Payer: 59 | Source: Ambulatory Visit | Attending: Cardiology | Admitting: Cardiology

## 2015-12-20 ENCOUNTER — Encounter: Payer: Self-pay | Admitting: Cardiology

## 2015-12-20 DIAGNOSIS — I214 Non-ST elevation (NSTEMI) myocardial infarction: Secondary | ICD-10-CM

## 2015-12-20 DIAGNOSIS — Z955 Presence of coronary angioplasty implant and graft: Secondary | ICD-10-CM

## 2015-12-23 ENCOUNTER — Telehealth (HOSPITAL_COMMUNITY): Payer: Self-pay | Admitting: Family Medicine

## 2015-12-23 ENCOUNTER — Encounter (HOSPITAL_COMMUNITY): Payer: 59

## 2015-12-25 ENCOUNTER — Encounter (HOSPITAL_COMMUNITY)
Admission: RE | Admit: 2015-12-25 | Discharge: 2015-12-25 | Disposition: A | Discharge status: Home / Self Care | Payer: 59 | Source: Ambulatory Visit | Attending: Cardiology | Admitting: Cardiology

## 2015-12-25 ENCOUNTER — Other Ambulatory Visit: Payer: Self-pay

## 2015-12-25 ENCOUNTER — Telehealth: Payer: Self-pay | Admitting: Cardiology

## 2015-12-25 ENCOUNTER — Other Ambulatory Visit: Payer: Self-pay | Admitting: *Deleted

## 2015-12-25 ENCOUNTER — Encounter: Payer: Self-pay | Admitting: *Deleted

## 2015-12-25 ENCOUNTER — Ambulatory Visit (HOSPITAL_COMMUNITY)
Admission: RE | Admit: 2015-12-25 | Discharge: 2015-12-25 | Disposition: A | Discharge status: Home / Self Care | Payer: 59 | Source: Ambulatory Visit | Attending: Family Medicine | Admitting: Family Medicine

## 2015-12-25 DIAGNOSIS — I214 Non-ST elevation (NSTEMI) myocardial infarction: Secondary | ICD-10-CM | POA: Diagnosis not present

## 2015-12-25 DIAGNOSIS — Z006 Encounter for examination for normal comparison and control in clinical research program: Secondary | ICD-10-CM

## 2015-12-25 DIAGNOSIS — Z029 Encounter for administrative examinations, unspecified: Secondary | ICD-10-CM | POA: Insufficient documentation

## 2015-12-25 DIAGNOSIS — Z955 Presence of coronary angioplasty implant and graft: Secondary | ICD-10-CM

## 2015-12-25 MED ORDER — AMBULATORY NON FORMULARY MEDICATION: 81.0000 mg | Freq: Every day | Status: DC

## 2015-12-25 NOTE — Progress Notes (Signed)
TWILIGHT research study month 3 randomization visit completed. Patient denies any adverse events. He states he has been compliant with medication. He returned 0 pills in the ASA bottle and 15 pills in the Brilinta bottle. Today is has been randomized to ASA 81 mg daily or [place]. Dispensed bottle numbers [id number]; [id number]; [id number]; [id number]. Copy of Research required follow up visits given to patient. Instructed patient to NOT take any open label ASA for remainder of study. Questions encouraged and  Answered.

## 2015-12-25 NOTE — Telephone Encounter (Signed)
Raymond Jordan from Cardiac Rehab called to report the patient has been experiencing CP on inspiration while and after exercising. The pain seems to resolve with rest. She reports he is currently not having CP. He has no other symptoms and is not in distress. Patient was offered an appointment with NP this Friday, 6/23 and he declined. Instructed Raymond Jordan to direct patient to PCP for chest discomfort on inspiration (he st he had bronchitis 2 weeks ago). Patient agrees with treatment plan. He will try PCP and if symptoms worsen will go to ED.

## 2015-12-25 NOTE — Progress Notes (Signed)
Raymond Jordan reports having chest pain with deep inspiration for the past 3 days. Blood pressure 118/82 . Heart rate 85. SA02 99% on room air. Lung fields clear upon ascultation. Raymond Jordan denies having any chest discomfort while sitting. Patient did not report this until after after exercise class and he had taken his monitor off. Patient placed on the portable monitor. Telemetry rhythm Sinus. Upon assessment lung fields clear upon ascultation. Patient denies being in any distress currently and went to work today. Raymond RighterErin Smith NP called and notified whom came to cardiac rehab and evaluated Raymond Jordan. 12 lead ECG obtained. Erin reviewed the 12 lead ECG talked with Raymond Jordan and said the Raymond Jordan is okay to go home and may return to exercise on Friday. I also talked with Raymond Jordan, Dr Norris Crossurner's nurse to notify. Will fax exercise flow sheets to Dr. Norris Crossurner's office for review. Raymond Jordan left cardiac rehab without complaints. Will continue to monitor the patient throughout  the program.

## 2015-12-25 NOTE — Telephone Encounter (Signed)
New message     Talk to a nurse.  Pt exercised today and is having chest pain with a deep breath.

## 2015-12-27 ENCOUNTER — Ambulatory Visit: Payer: 59 | Admitting: Nurse Practitioner

## 2015-12-27 ENCOUNTER — Encounter (HOSPITAL_COMMUNITY)
Admission: RE | Admit: 2015-12-27 | Discharge: 2015-12-27 | Disposition: A | Discharge status: Home / Self Care | Payer: 59 | Source: Ambulatory Visit | Attending: Cardiology | Admitting: Cardiology

## 2015-12-27 DIAGNOSIS — I214 Non-ST elevation (NSTEMI) myocardial infarction: Secondary | ICD-10-CM

## 2015-12-27 DIAGNOSIS — Z955 Presence of coronary angioplasty implant and graft: Secondary | ICD-10-CM

## 2015-12-27 NOTE — Progress Notes (Signed)
Cardiac Individual Treatment Plan  Patient Details  Name: Raymond Jordan MRN: [id number] Date of Birth: [birth year] Referring Provider:        CARDIAC REHAB PHASE II ORIENTATION from 11/12/2015 in [facility] CARDIAC REHAB   Referring Provider  Armanda Magic MD      Initial Encounter Date:       CARDIAC REHAB PHASE II ORIENTATION from 11/12/2015 in [facility] CARDIAC REHAB   Date  11/12/15   Referring Provider  Armanda Magic MD      Visit Diagnosis: No diagnosis found.  Patient's Home Medications on Admission:  Current outpatient prescriptions:  .  AMBULATORY NON FORMULARY MEDICATION, Take 90 mg by mouth 2 (two) times daily. Medication Name: BRILINTA 90 mg BID (TWILIGHT Research study provided do not fill), Disp: , Rfl:  .  AMBULATORY NON FORMULARY MEDICATION, Take 81 mg by mouth daily. Medication Name: Aspirin 81 mg daily or [facility] research study provided), Disp: , Rfl:  .  amoxicillin-clavulanate (AUGMENTIN) 875-125 MG tablet, Take 1 tablet by mouth every 12 (twelve) hours., Disp: , Rfl: 0 .  B Complex-Biotin-FA (SUPER B-COMPLEX) TABS, Take 1 tablet by mouth daily., Disp: , Rfl:  .  cetirizine (ZYRTEC) 10 MG tablet, Take 10 mg by mouth daily as needed for allergies. , Disp: , Rfl:  .  fluticasone (FLONASE) 50 MCG/ACT nasal spray, Place 2 sprays into both nostrils as needed for allergies or rhinitis., Disp: , Rfl:  .  metoprolol tartrate (LOPRESSOR) 25 MG tablet, Take 1 tablet (25 mg total) by mouth 2 (two) times daily., Disp: 28 tablet, Rfl: 0 .  nitroGLYCERIN (NITROSTAT) 0.4 MG SL tablet, Place 1 tablet (0.4 mg total) under the tongue every 5 (five) minutes as needed for chest pain., Disp: 25 tablet, Rfl: 3 .  pravastatin (PRAVACHOL) 80 MG tablet, Take 1 tablet (80 mg total) by mouth daily., Disp: 90 tablet, Rfl: 3 .  VENTOLIN HFA 108 (90 Base) MCG/ACT inhaler, Inhale 2 puffs into the lungs every 4 (four) hours as needed. Shortness of breath  or wheezing, Disp: , Rfl: 1  Past Medical History: Past Medical History  Diagnosis Date  . Hypertension   . Seasonal allergies   . Allergic rhinitis     /asthma  . Hypercholesteremia   . ED (erectile dysfunction)   . 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 Childhood asthma   . Obstructive sleep apnea on CPAP   . Osteoarthritis     OA knees, shoulders; DDD lumbar spine  . Arthritis     "shoulders, back, knees" (09/16/2015)  . CAD (coronary artery disease), native coronary artery 09/15/2015    s/p NSTEMI 09/2015 with cath showing 99% RCA s/p DES now on DAPT  . Hyperlipidemia LDL goal <70     Tobacco Use: History  Smoking status  . Former Smoker  Smokeless tobacco  . Never Used    Comment: "smoked 2 packs of cigaretes in my whole life"    Labs:     Recent Review Flowsheet Data    Labs for ITP Cardiac and Pulmonary Rehab Latest Ref Rng 09/15/2015 09/16/2015 12/03/2015   Cholestrol 125 - 200 mg/dL [phone]   LDLCALC <086 mg/dL 578(I) 85 88   HDL >=69 mg/dL 62(X) 52(W) 46   Trlycerides <150 mg/dL 413(K) 440 81      Capillary Blood Glucose: No results found for: GLUCAP   Exercise Target Goals:    Exercise Program Goal: Individual exercise prescription set with THRR,  safety & activity barriers. Participant demonstrates ability to understand and report RPE using BORG scale, to self-measure pulse accurately, and to acknowledge the importance of the exercise prescription.  Exercise Prescription Goal: Starting with aerobic activity 30 plus minutes a day, 3 days per week for initial exercise prescription. Provide home exercise prescription and guidelines that participant acknowledges understanding prior to discharge.  Activity Barriers & Risk Stratification:     Activity Barriers & Cardiac Risk Stratification - 11/12/15 1424    Activity Barriers & Cardiac Risk Stratification   Activity Barriers Arthritis;Back Problems;Joint Problems  Arth in knees, back, shoulders; torn  rotator cuff bilateral shoulder (r has been repaired)   Cardiac Risk Stratification High      6 Minute Walk:     6 Minute Walk      11/12/15 1531       6 Minute Walk   Phase Initial     Distance 1913 feet     Walk Time 6 minutes     # of Rest Breaks 0     MPH 3.62     METS 4.83     RPE 11     VO2 Peak 16.91     Symptoms No     Resting HR 72 bpm     Resting BP 126/82 mmHg     Max Ex. HR 115 bpm     Max Ex. BP 150/80 mmHg     2 Minute Post BP 120/76 mmHg        Initial Exercise Prescription:     Initial Exercise Prescription - 11/12/15 1500    Date of Initial Exercise RX and Referring Provider   Date 11/12/15   Referring Provider Armanda Magicurner, Traci MD   Treadmill   MPH 3   Grade 3   Minutes 10   METs 4.54   Bike   Level 2.3   Minutes 10   METs 4.77   NuStep   Level 3   Minutes 10   METs 3   Prescription Details   Frequency (times per week) 3   Duration Progress to 30 minutes of continuous aerobic without signs/symptoms of physical distress   Intensity   THRR 40-80% of Max Heartrate 84-134   Ratings of Perceived Exertion 11-13   Perceived Dyspnea 0-4   Progression   Progression Continue to progress workloads to maintain intensity without signs/symptoms of physical distress.   Resistance Training   Training Prescription Yes   Weight 3 lbs.   Reps 10-12      Perform Capillary Blood Glucose checks as needed.  Exercise Prescription Changes:      Exercise Prescription Changes      11/20/15 1445 12/06/15 1700 12/23/15 0700       Exercise Review   Progression  Yes Yes     Response to Exercise   Blood Pressure (Admit) 130/84 mmHg 120/80 mmHg 124/78 mmHg     Blood Pressure (Exercise) 160/80 mmHg 158/82 mmHg 146/84 mmHg     Blood Pressure (Exit) 138/78 mmHg 122/62 mmHg 130/80 mmHg     Heart Rate (Admit) 97 bpm 79 bpm 80 bpm     Heart Rate (Exercise) 143 bpm 146 bpm 146 bpm     Heart Rate (Exit) 95 bpm 85 bpm 84 bpm     Perceived Dyspnea (Exercise) 11  13 12      Symptoms none none none     Comments  Home exercise given 11/27/15. Home exercise given 11/27/15.     Duration Progress  to 30 minutes of continuous aerobic without signs/symptoms of physical distress Progress to 30 minutes of continuous aerobic without signs/symptoms of physical distress Progress to 30 minutes of continuous aerobic without signs/symptoms of physical distress     Intensity THRR unchanged THRR unchanged THRR unchanged     Progression   Progression Continue to progress workloads to maintain intensity without signs/symptoms of physical distress. Continue to progress workloads to maintain intensity without signs/symptoms of physical distress. Continue to progress workloads to maintain intensity without signs/symptoms of physical distress.     Average METs 3.8 4.7 4.8     Resistance Training   Training Prescription Yes Yes Yes     Weight 3 lbs. 5 lbs 5 lbs     Reps 10-12 10-12 10-12     Interval Training   Interval Training No No No     Treadmill   MPH 3 3.5 3.5     Grade Minutes METs 4.54 6.09 6.09     Bike   Level 1.3 2 2.2     Minutes METs 3.18 4.3 4.32     NuStep   Level Minutes METs 2 3.7 3.9     Home Exercise Plan   Plans to continue exercise at  Home Home     Frequency  Add 4 additional days to program exercise sessions. Add 4 additional days to program exercise sessions.        Exercise Comments:      Exercise Comments      11/25/15 0831 12/06/15 1711 12/20/15 0746       Exercise Comments Pt off to a good start with exercise. Doing well with exercise. Home exercise guidelines given on 11/27/15. Continues to progress well with exercise, increasing workloads consistently.        Discharge Exercise Prescription (Final Exercise Prescription Changes):     Exercise Prescription Changes - 12/23/15 0700    Exercise Review   Progression Yes   Response to Exercise   Blood Pressure (Admit)  124/78 mmHg   Blood Pressure (Exercise) 146/84 mmHg   Blood Pressure (Exit) 130/80 mmHg   Heart Rate (Admit) 80 bpm   Heart Rate (Exercise) 146 bpm   Heart Rate (Exit) 84 bpm   Perceived Dyspnea (Exercise) 12   Symptoms none   Comments Home exercise given 11/27/15.   Duration Progress to 30 minutes of continuous aerobic without signs/symptoms of physical distress   Intensity THRR unchanged   Progression   Progression Continue to progress workloads to maintain intensity without signs/symptoms of physical distress.   Average METs 4.8   Resistance Training   Training Prescription Yes   Weight 5 lbs   Reps 10-12   Interval Training   Interval Training No   Treadmill   MPH 3.5   Grade 5   Minutes 10   METs 6.09   Bike   Level 2.2   Minutes 10   METs 4.32   NuStep   Level 5   Minutes 10   METs 3.9   Home Exercise Plan   Plans to continue exercise at Home   Frequency Add 4 additional days to program exercise sessions.      Nutrition:  Target Goals: Understanding of nutrition guidelines, daily intake of sodium 1500mg , cholesterol 200mg , calories 30% from fat and  7% or less from saturated fats, daily to have 5 or more servings of fruits and vegetables.  Biometrics:     Pre Biometrics - 11/12/15 1532    Pre Biometrics   Height 5' 11.25" (1.81 m)   Weight 253 lb 4.9 oz (114.9 kg)   Waist Circumference 40.25 inches   Hip Circumference 41.25 inches   Waist to Hip Ratio 0.98 %   BMI (Calculated) 35.2   Triceps Skinfold 27 mm   % Body Fat 31.8 %   Grip Strength 59.5 kg   Flexibility 9.5 in   Single Leg Stand 30 seconds       Nutrition Therapy Plan and Nutrition Goals:     Nutrition Therapy & Goals - 11/14/15 1352    Nutrition Therapy   Diet Therapeutic Lifestyle Changes   Personal Nutrition Goals   Personal Goal #1 0.5-2 lb wt loss per week to a goal wt loss of 6-24 lb at graduation from Cardiac Rehab   Intervention Plan   Intervention Prescribe, educate and  counsel regarding individualized specific dietary modifications aiming towards targeted core components such as weight, hypertension, lipid management, diabetes, heart failure and other comorbidities.   Expected Outcomes Short Term Goal: Understand basic principles of dietary content, such as calories, fat, sodium, cholesterol and nutrients.;Long Term Goal: Adherence to prescribed nutrition plan.      Nutrition Discharge: Nutrition Scores:     Nutrition Assessments - 12/04/15 1601    MEDFICTS Scores   Pre Score 18      Nutrition Goals Re-Evaluation:   Psychosocial: Target Goals: Acknowledge presence or absence of depression, maximize coping skills, provide positive support system. Participant is able to verbalize types and ability to use techniques and skills needed for reducing stress and depression.  Initial Review & Psychosocial Screening:     Initial Psych Review & Screening - 11/14/15 1017    Family Dynamics   Good Support System? Yes   Barriers   Psychosocial barriers to participate in program There are no identifiable barriers or psychosocial needs.   Screening Interventions   Interventions Encouraged to exercise      Quality of Life Scores:     Quality of Life - 11/12/15 1546    Quality of Life Scores   Health/Function Pre (p) 22.4 %   Socioeconomic Pre (p) 24 %   Psych/Spiritual Pre (p) 24 %   Family Pre (p) 27.6 %   GLOBAL Pre (p) 23.83 %      PHQ-9:     Recent Review Flowsheet Data    Depression screen [facility] 2/9 11/20/2015 01/11/2015   Decreased Interest 0 0   Down, Depressed, Hopeless 0 0   PHQ - 2 Score 0 0      Psychosocial Evaluation and Intervention:   Psychosocial Re-Evaluation:     Psychosocial Re-Evaluation      11/28/15 1405 12/27/15 1349         Psychosocial Re-Evaluation   Interventions Encouraged to attend Cardiac Rehabilitation for the exercise Encouraged to attend Cardiac Rehabilitation for the exercise      Continued  Psychosocial Services Needed No No         Vocational Rehabilitation: Provide vocational rehab assistance to qualifying candidates.   Vocational Rehab Evaluation & Intervention:     Vocational Rehab - 11/13/15 1656    Initial Vocational Rehab Evaluation & Intervention   Assessment shows need for Vocational Rehabilitation No      Education: Education Goals: Education classes will be provided on  a weekly basis, covering required topics. Participant will state understanding/return demonstration of topics presented.  Learning Barriers/Preferences:     Learning Barriers/Preferences - 11/12/15 1540    Learning Barriers/Preferences   Learning Barriers None   Learning Preferences Audio;Verbal Instruction;Written Material;Group Instruction;Individual Instruction      Education Topics: Count Your Pulse:  -Group instruction provided by verbal instruction, demonstration, patient participation and written materials to support subject.  Instructors address importance of being able to find your pulse and how to count your pulse when at home without a heart monitor.  Patients get hands on experience counting their pulse with staff help and individually.   Heart Attack, Angina, and Risk Factor Modification:  -Group instruction provided by verbal instruction, video, and written materials to support subject.  Instructors address signs and symptoms of angina and heart attacks.    Also discuss risk factors for heart disease and how to make changes to improve heart health risk factors.   Functional Fitness:  -Group instruction provided by verbal instruction, demonstration, patient participation, and written materials to support subject.  Instructors address safety measures for doing things around the house.  Discuss how to get up and down off the floor, how to pick things up properly, how to safely get out of a chair without assistance, and balance training.   Meditation and Mindfulness:  -Group  instruction provided by verbal instruction, patient participation, and written materials to support subject.  Instructor addresses importance of mindfulness and meditation practice to help reduce stress and improve awareness.  Instructor also leads participants through a meditation exercise.    Stretching for Flexibility and Mobility:  -Group instruction provided by verbal instruction, patient participation, and written materials to support subject.  Instructors lead participants through series of stretches that are designed to increase flexibility thus improving mobility.  These stretches are additional exercise for major muscle groups that are typically performed during regular warm up and cool down.   Hands Only CPR Anytime:  -Group instruction provided by verbal instruction, video, patient participation and written materials to support subject.  Instructors co-teach with AHA video for hands only CPR.  Participants get hands on experience with mannequins.   Nutrition I class: Heart Healthy Eating:  -Group instruction provided by PowerPoint slides, verbal discussion, and written materials to support subject matter. The instructor gives an explanation and review of the Therapeutic Lifestyle Changes diet recommendations, which includes a discussion on lipid goals, dietary fat, sodium, fiber, plant stanol/sterol esters, sugar, and the components of a well-balanced, healthy diet.      CARDIAC REHAB PHASE II EXERCISE from 12/04/2015 in [facility] CARDIAC REHAB   Date  12/04/15   Educator  RD   Instruction Review Code  Not applicable [class handout given]      Nutrition II class: Lifestyle Skills:  -Group instruction provided by PowerPoint slides, verbal discussion, and written materials to support subject matter. The instructor gives an explanation and review of label reading, grocery shopping for heart health, heart healthy recipe modifications, and ways to make healthier choices  when eating out.          CARDIAC REHAB PHASE II EXERCISE from 12/04/2015 in [facility] CARDIAC REHAB   Date  12/04/15   Educator  RD   Instruction Review Code  Not applicable [class handouts given]      Diabetes Question & Answer:  -Group instruction provided by PowerPoint slides, verbal discussion, and written materials to support subject matter. The instructor gives  an explanation and review of diabetes co-morbidities, pre- and post-prandial blood glucose goals, pre-exercise blood glucose goals, signs, symptoms, and treatment of hypoglycemia and hyperglycemia, and foot care basics.   Diabetes Blitz:  -Group instruction provided by PowerPoint slides, verbal discussion, and written materials to support subject matter. The instructor gives an explanation and review of the physiology behind type 1 and type 2 diabetes, diabetes medications and rational behind using different medications, pre- and post-prandial blood glucose recommendations and Hemoglobin A1c goals, diabetes diet, and exercise including blood glucose guidelines for exercising safely.    Portion Distortion:  -Group instruction provided by PowerPoint slides, verbal discussion, written materials, and food models to support subject matter. The instructor gives an explanation of serving size versus portion size, changes in portions sizes over the last 20 years, and what consists of a serving from each food group.   Stress Management:  -Group instruction provided by verbal instruction, video, and written materials to support subject matter.  Instructors review role of stress in heart disease and how to cope with stress positively.     Exercising on Your Own:  -Group instruction provided by verbal instruction, power point, and written materials to support subject.  Instructors discuss benefits of exercise, components of exercise, frequency and intensity of exercise, and end points for exercise.  Also discuss use of  nitroglycerin and activating EMS.  Review options of places to exercise outside of rehab.  Review guidelines for sex with heart disease.   Cardiac Drugs I:  -Group instruction provided by verbal instruction and written materials to support subject.  Instructor reviews cardiac drug classes: antiplatelets, anticoagulants, beta blockers, and statins.  Instructor discusses reasons, side effects, and lifestyle considerations for each drug class.   Cardiac Drugs II:  -Group instruction provided by verbal instruction and written materials to support subject.  Instructor reviews cardiac drug classes: angiotensin converting enzyme inhibitors (ACE-I), angiotensin II receptor blockers (ARBs), nitrates, and calcium channel blockers.  Instructor discusses reasons, side effects, and lifestyle considerations for each drug class.   Anatomy and Physiology of the Circulatory System:  -Group instruction provided by verbal instruction, video, and written materials to support subject.  Reviews functional anatomy of heart, how it relates to various diagnoses, and what role the heart plays in the overall system.   Knowledge Questionnaire Score:     Knowledge Questionnaire Score - 11/12/15 1541    Knowledge Questionnaire Score   Pre Score 22/24      Core Components/Risk Factors/Patient Goals at Admission:     Personal Goals and Risk Factors at Admission - 11/12/15 1543    Core Components/Risk Factors/Patient Goals on Admission   Admit Weight 253 lb 4.9 oz (114.9 kg)   Goal Weight: Short Term 247 lb (112.038 kg)   Hypertension Yes   Intervention Provide education on lifestyle modifcations including regular physical activity/exercise, weight management, moderate sodium restriction and increased consumption of fresh fruit, vegetables, and low fat dairy, alcohol moderation, and smoking cessation.;Monitor prescription use compliance.   Expected Outcomes Short Term: Continued assessment and intervention until BP  is < 140/35mm HG in hypertensive participants. < 130/36mm HG in hypertensive participants with diabetes, heart failure or chronic kidney disease.;Long Term: Maintenance of blood pressure at goal levels.   Lipids Yes   Intervention Provide education and support for participant on nutrition & aerobic/resistive exercise along with prescribed medications to achieve LDL 70mg , HDL >40mg .   Expected Outcomes Short Term: Participant states understanding of desired cholesterol values and is compliant with  medications prescribed. Participant is following exercise prescription and nutrition guidelines.;Long Term: Cholesterol controlled with medications as prescribed, with individualized exercise RX and with personalized nutrition plan. Value goals: LDL < 70mg , HDL > 40 mg.      Core Components/Risk Factors/Patient Goals Review:      Goals and Risk Factor Review      12/04/15 1601 12/23/15 0748         Core Components/Risk Factors/Patient Goals Review   Personal Goals Review Weight Management/Obesity Weight Management/Obesity;Other      Review see 12/04/15 RD note; wt today down 2.5 kg Pt is almost at short term goal weight. Current weight is 247.7 lbs. Pt has returned to outdoor cycling ~5 miles, 30-35 minutes 2x's/week.      Expected Outcomes slow wt loss of 0.5-2 lb/week Continue exercise program 30-35 minutes, 5-7 days per week to maintain heart health and weight loss.         Core Components/Risk Factors/Patient Goals at Discharge (Final Review):      Goals and Risk Factor Review - 12/23/15 0748    Core Components/Risk Factors/Patient Goals Review   Personal Goals Review Weight Management/Obesity;Other   Review Pt is almost at short term goal weight. Current weight is 247.7 lbs. Pt has returned to outdoor cycling ~5 miles, 30-35 minutes 2x's/week.   Expected Outcomes Continue exercise program 30-35 minutes, 5-7 days per week to maintain heart health and weight loss.      ITP Comments:      ITP Comments      11/12/15 1355           ITP Comments Dr. Armanda Magic, Medical Director           Comments: Pt is making expected progress toward personal goals after completing 11 sessions. Recommend continued exercise and life style modification education including  stress management and relaxation techniques to decrease cardiac risk profile. Gladstone Lighter, RN,BSN 12/27/2015 1:51 PM

## 2016-01-01 ENCOUNTER — Encounter (HOSPITAL_COMMUNITY)
Admission: RE | Admit: 2016-01-01 | Discharge: 2016-01-01 | Disposition: A | Discharge status: Home / Self Care | Payer: 59 | Source: Ambulatory Visit | Attending: Cardiology | Admitting: Cardiology

## 2016-01-01 DIAGNOSIS — I214 Non-ST elevation (NSTEMI) myocardial infarction: Secondary | ICD-10-CM | POA: Diagnosis not present

## 2016-01-01 DIAGNOSIS — Z955 Presence of coronary angioplasty implant and graft: Secondary | ICD-10-CM

## 2016-01-02 ENCOUNTER — Telehealth: Payer: Self-pay

## 2016-01-02 NOTE — Telephone Encounter (Signed)
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 He requests a note from Dr. Mayford Knifeurner stating it is safe for him to work/drive and that he has been participating in Cardiac Rehab. He understands that it may be early next week until Dr. Mayford Knifeurner is able to respond.  To Dr. Mayford Knifeurner for clearance.

## 2016-01-02 NOTE — Telephone Encounter (Signed)
-----   Message from Marcelle SmilingKimberly D Mesiemore sent at 01/02/2016 10:14 AM EDT ----- Contact: [phone] Hey,  Patient called asking for a RTW Note- also needs it to say he has been attending Cardiac Rehab.  He is asking for a Return call please.   Thanks, Selena BattenKim

## 2016-01-03 ENCOUNTER — Telehealth (HOSPITAL_COMMUNITY): Payer: Self-pay | Admitting: *Deleted

## 2016-01-03 ENCOUNTER — Encounter: Payer: Self-pay | Admitting: Cardiology

## 2016-01-03 NOTE — Telephone Encounter (Signed)
Attempted to retrieve download from [employer] but it shows patient is not compliant. Called patient and confirmed he is using PAP every night.  [facility] and to make sure it is set up correctly at home. He understands they will also get a download and send to Dr. Mayford Knifeurner for review.

## 2016-01-03 NOTE — Telephone Encounter (Signed)
Follow up   Pt is calling for rn he verbalized that he wants a C-pap download for the DOT

## 2016-01-08 ENCOUNTER — Telehealth: Payer: Self-pay | Admitting: Cardiology

## 2016-01-08 NOTE — Telephone Encounter (Signed)
New Message:   He wants to know if you have gotten the paperwork ready for him that he needs for his DOT. He wants to know if you can have it ready by tomorrow please?

## 2016-01-08 NOTE — Telephone Encounter (Signed)
Informed patient paperwork is not yet completed but Dr. Mayford Knifeurner will finish as soon as possible. Informed him he will be called when paperwork is ready for him to pick up. He was grateful for call.

## 2016-01-08 NOTE — Telephone Encounter (Signed)
Per Dr. Mayford Knifeurner, called to follow-up with patient about CP on inspiration reported at Cardiac Rehab. Patient st he went to PCP after Cardiac Rehab and was treated for bronchitis. He is now symptom and chest pain free. Patient understands he will be called when Dr. 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  He was grateful for follow-up.

## 2016-01-09 NOTE — Telephone Encounter (Signed)
Letter written for DOT and placed at front desk for patient pick-up.  Patient notified.

## 2016-01-11 ENCOUNTER — Ambulatory Visit (INDEPENDENT_AMBULATORY_CARE_PROVIDER_SITE_OTHER): Payer: Self-pay | Admitting: Physician Assistant

## 2016-01-11 VITALS — BP 128/70 | HR 75 | Temp 98.5°F | Resp 18 | Ht 71.0 in | Wt 248.4 lb

## 2016-01-11 DIAGNOSIS — Z021 Encounter for pre-employment examination: Secondary | ICD-10-CM

## 2016-01-11 DIAGNOSIS — Z0289 Encounter for other administrative examinations: Secondary | ICD-10-CM

## 2016-01-11 NOTE — Patient Instructions (Signed)
     IF you received an x-ray today, you will receive an invoice from [facility]. Please contact [facility] at [phone] with questions or concerns regarding your invoice.   IF you received labwork today, you will receive an invoice from [facility]. Please contact [facility] at [phone] with questions or concerns regarding your invoice.   Our billing staff will not be able to assist you with questions regarding bills from these companies.  You will be contacted with the lab results as soon as they are available. The fastest way to get your results is to activate your My Chart account. Instructions are located on the last page of this paperwork. If you have not heard from us regarding the results in 2 weeks, please contact this office.      

## 2016-01-11 NOTE — Progress Notes (Signed)
[facility] [address], [place] [address] [phone]  Date:  01/11/2016   Name:  Raymond Jordan   DOB:  Dec 07, 1960   MRN:  [id number]  PCP:  Hollice Espy, MD    History of Present Illness:  Raymond Jordan is a 55 y.o. male patient who presents to [facility] for dot exam. He has no complaints or concerns at this time. Patient reports that he has followed up with his cardiologist after having an increased STEMI with stent placed 4 months ago. Cardiologist has evaluated him for cardiac clearance. He is compliant on his blood pressure medicines. He denies any chest pain, palpitations, shortness of breath, dizziness, vision changes, leg swelling. He currently is being followed by therapy 3 times a week at this time.     Patient Active Problem List   Diagnosis Date Noted  . CAD (coronary artery disease), native coronary artery 12/13/2015  . NSTEMI (non-ST elevated myocardial infarction) (HCC) 09/15/2015  . Atypical chest pain 09/14/2015  . Obesity 06/26/2013  . Health examination of defined subpopulation 12/19/2012  . Essential hypertension, benign 12/19/2012  . Pure hypercholesterolemia 12/19/2012  . OSA on CPAP 12/19/2012    Past Medical History  Diagnosis Date  . Hypertension   . Seasonal allergies   . Allergic rhinitis     /asthma  . Hypercholesteremia   . ED (erectile dysfunction)   . 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 Childhood asthma   . Obstructive sleep apnea on CPAP   . Osteoarthritis     OA knees, shoulders; DDD lumbar spine  . Arthritis     "shoulders, back, knees" (09/16/2015)  . CAD (coronary artery disease), native coronary artery 09/15/2015    s/p NSTEMI 09/2015 with cath showing 99% RCA s/p DES now on DAPT  . Hyperlipidemia LDL goal <70     Past Surgical History  Procedure Laterality Date  . Ankle surgery  1985    bone spur removal ankle.  . Nasal sinus surgery  ~ 2007    "& fixed my septum"  . Shoulder open rotator cuff  repair Right 10/2013  . Myringotomy with tube placement Left ~ 07/2009  . Vasectomy    . Wisdom tooth extraction  ~ 1992  . Cardiac catheterization N/A 09/16/2015    Procedure: Left Heart Cath and Coronary Angiography;  Surgeon: Kathleene Hazel, MD;  Location: [facility] INVASIVE CV LAB;  Service: Cardiovascular;  Laterality: N/A;  . Cardiac catheterization N/A 09/16/2015    Procedure: Coronary Stent Intervention;  Surgeon: Kathleene Hazel, MD;  Location: [facility] INVASIVE CV LAB;  Service: Cardiovascular;  Laterality: N/A;    Social History  Substance Use Topics  . Smoking status: Former Games developer  . Smokeless tobacco: Never Used     Comment: "smoked 2 packs of cigaretes in my whole life"  . Alcohol Use: Yes     Comment: 09/16/2015 "might have a couple drinks q couple months"    Family History  Problem Relation Age of Onset  . Asthma Mother     sarcoidosis also  . Diabetes Mother   . Stroke Mother   . Sarcoidosis Mother   . Stroke Father   . Cancer Father     R breast cancer  . Heart disease Father     AMI/CAD with stenting  . Arthritis Father   . Heart attack Father     Allergies  Allergen Reactions  . Crestor [Rosuvastatin]     Muscle aches & cough  .  Lipitor [Atorvastatin]     Muscle aches & cough    Medication list has been reviewed and updated.  Current Outpatient Prescriptions on File Prior to Visit  Medication Sig Dispense Refill  . AMBULATORY NON FORMULARY MEDICATION Take 90 mg by mouth 2 (two) times daily. Medication Name: BRILINTA 90 mg BID (TWILIGHT Research study provided do not fill)    . AMBULATORY NON FORMULARY MEDICATION Take 81 mg by mouth daily. Medication Name: Aspirin 81 mg daily or PLACEBO [employer] research study provided)    . amoxicillin-clavulanate (AUGMENTIN) 875-125 MG tablet Take 1 tablet by mouth every 12 (twelve) hours.  0  . B Complex-Biotin-FA (SUPER B-COMPLEX) TABS Take 1 tablet by mouth daily.    . cetirizine (ZYRTEC) 10 MG tablet Take 10  mg by mouth daily as needed for allergies.     . fluticasone (FLONASE) 50 MCG/ACT nasal spray Place 2 sprays into both nostrils as needed for allergies or rhinitis.    . metoprolol tartrate (LOPRESSOR) 25 MG tablet Take 1 tablet (25 mg total) by mouth 2 (two) times daily. 28 tablet 0  . nitroGLYCERIN (NITROSTAT) 0.4 MG SL tablet Place 1 tablet (0.4 mg total) under the tongue every 5 (five) minutes as needed for chest pain. 25 tablet 3  . pravastatin (PRAVACHOL) 80 MG tablet Take 1 tablet (80 mg total) by mouth daily. 90 tablet 3  . VENTOLIN HFA 108 (90 Base) MCG/ACT inhaler Inhale 2 puffs into the lungs every 4 (four) hours as needed. Shortness of breath or wheezing  1   No current facility-administered medications on file prior to visit.    ROS ROS otherwise normal unless listed above.  Physical Examination: BP 128/70 mmHg  Pulse 75  Temp(Src) 98.5 F (36.9 C) (Oral)  Resp 18  Ht 5\' 11"  (1.803 m)  Wt 248 lb 6 oz (112.662 kg)  BMI 34.66 kg/m2  SpO2 97% Ideal Body Weight: Weight in (lb) to have BMI = 25: 178.9  Physical Exam  Constitutional: He is oriented to person, place, and time. He appears well-developed and well-nourished. No distress.  HENT:  Head: Normocephalic and atraumatic.  Right Ear: Tympanic membrane, external ear and ear canal normal.  Left Ear: Tympanic membrane, external ear and ear canal normal.  Eyes: Conjunctivae and EOM are normal. Pupils are equal, round, and reactive to light.  Cardiovascular: Normal rate and regular rhythm.  Exam reveals no friction rub.   No murmur heard. Pulmonary/Chest: Effort normal. No respiratory distress. He has no wheezes.  Abdominal: Soft. Bowel sounds are normal. He exhibits no distension and no mass. There is no tenderness. No hernia. Hernia confirmed negative in the right inguinal area and confirmed negative in the left inguinal area.  Musculoskeletal: Normal range of motion. He exhibits no edema or tenderness.  Neurological: He  is alert and oriented to person, place, and time. He displays normal reflexes.  Skin: Skin is warm and dry. He is not diaphoretic.  Psychiatric: He has a normal mood and affect. His behavior is normal.    Visual Acuity Screening   Right eye Left eye Both eyes  Without correction:     With correction: 20/30 20/20 20/25   Comments: Colors: 7/7 Horizontal: 85 Left &amp; right  Hearing Screening Comments: Whisper test: 19ft both ears  Assessment and Plan: Raymond Jordan is a 55 y.o. male who is here today for DOT exam. Cleared for 1 month, following 3 months post stent, and cardiac clearance letter.  Encounter for examination required  by Department of Transportation (DOT)  Trena PlattStephanie English, PA-C [facility] 01/11/2016 6:45 PM

## 2016-01-13 ENCOUNTER — Telehealth: Payer: Self-pay | Admitting: Cardiology

## 2016-01-13 NOTE — Telephone Encounter (Signed)
WILL FORWARD  TO TAMMY  HEDRICK RN IN RESEARCH .Zack Seal/CY

## 2016-01-13 NOTE — Telephone Encounter (Signed)
New message      Pt had a DOT phy Saturday.  He had blood in his urine.  He is in the "twilight" study at [facility] and he is on brilinta.  Could the blood in his urine be coming from the brilinta?

## 2016-01-13 NOTE — Telephone Encounter (Signed)
NOT  SURE OF STUDY  IS THERE NOT  A PROTOCOL?

## 2016-01-13 NOTE — Telephone Encounter (Signed)
PT  NOTIFIED  TO  F/U WITH PMD   RE  BLOOD IN URINE  AND  TO  GIVE UPDATE  IF  CARDIOLOGY NEEDS  TO DISCONTINUE  BRIILINTA .Zack Seal/CY

## 2016-01-13 NOTE — Telephone Encounter (Signed)
LM TO CALL BACK ./CY 

## 2016-01-15 ENCOUNTER — Encounter (HOSPITAL_COMMUNITY)
Admission: RE | Admit: 2016-01-15 | Discharge: 2016-01-15 | Disposition: A | Discharge status: Home / Self Care | Payer: 59 | Source: Ambulatory Visit | Attending: Cardiology | Admitting: Cardiology

## 2016-01-15 ENCOUNTER — Telehealth: Payer: Self-pay | Admitting: *Deleted

## 2016-01-15 DIAGNOSIS — E785 Hyperlipidemia, unspecified: Secondary | ICD-10-CM | POA: Insufficient documentation

## 2016-01-15 DIAGNOSIS — E7800 Pure hypercholesterolemia, unspecified: Secondary | ICD-10-CM | POA: Diagnosis not present

## 2016-01-15 DIAGNOSIS — Z79899 Other long term (current) drug therapy: Secondary | ICD-10-CM | POA: Insufficient documentation

## 2016-01-15 DIAGNOSIS — Z955 Presence of coronary angioplasty implant and graft: Secondary | ICD-10-CM | POA: Insufficient documentation

## 2016-01-15 DIAGNOSIS — I10 Essential (primary) hypertension: Secondary | ICD-10-CM | POA: Diagnosis not present

## 2016-01-15 DIAGNOSIS — I214 Non-ST elevation (NSTEMI) myocardial infarction: Secondary | ICD-10-CM | POA: Insufficient documentation

## 2016-01-15 NOTE — Telephone Encounter (Signed)
Left message for patient to return call to research office re: "blood in urine". Patient is in the [employer] study. He is on Brilinta 90 mg BID and asa 81 mg or PLACEBO.

## 2016-01-16 ENCOUNTER — Encounter: Payer: Self-pay | Admitting: Cardiology

## 2016-01-20 ENCOUNTER — Encounter: Payer: Self-pay | Admitting: *Deleted

## 2016-01-20 DIAGNOSIS — Z006 Encounter for examination for normal comparison and control in clinical research program: Secondary | ICD-10-CM

## 2016-01-20 NOTE — Progress Notes (Signed)
TWILIGHT Research study 4 month telephone call follow up completed. Patient states he did have his DOT physical and Blood in is urine was noted. He followed up the next Monday with his PCP and his urine was Negative for Blood. Instructed patient to just monitor urine and if blood was noted to return to pcp for evaluation. No other adverse events noted. Questions encouraged and answered.

## 2016-01-22 ENCOUNTER — Encounter (HOSPITAL_COMMUNITY)
Admission: RE | Admit: 2016-01-22 | Discharge: 2016-01-22 | Disposition: A | Discharge status: Home / Self Care | Payer: 59 | Source: Ambulatory Visit | Attending: Cardiology | Admitting: Cardiology

## 2016-01-22 DIAGNOSIS — Z955 Presence of coronary angioplasty implant and graft: Secondary | ICD-10-CM

## 2016-01-22 DIAGNOSIS — I214 Non-ST elevation (NSTEMI) myocardial infarction: Secondary | ICD-10-CM

## 2016-01-23 NOTE — Progress Notes (Signed)
Cardiac Individual Treatment Plan  Patient Details  Name: Leverne HumblesHoward N Vanorman MRN: [id number] Date of Birth: 03/12/1961 Referring Provider:        CARDIAC REHAB PHASE II [facility]ATION [facility]12[facility]   Referring Provider  Armanda Magicurner, Traci MD      Initial Encounter Date:       CARDIAC REHAB PHASE II OR[facility]ATION [facility]   Date  11/12/15   Referring Provider  Armanda Magicurner, Traci MD      Visit Diagnosis: NSTEMI (non-ST elevated myocardial infarction) [facility](HCC)  Stented coronary artery  Patient's Home Medications on Admission:  Current outpatient prescriptions:  .  AMBULATORY NON FORMULARY MEDICATION, Take 90 mg by mouth 2 (two) times daily. Medication Name: BRILINTA 90 mg BID (TWILIGHT Research study provided do not fill), Disp: , Rfl:  .  AMBULATORY NON FORMULARY MEDICATION, Take 81 mg by mouth daily. Medication Name: Aspirin 81 mg daily or [place](TWILGHT research study provided), Disp: , Rfl:  .  amoxicillin-clavulanate (AUGMENTIN) 875-125 MG tablet, Take 1 tablet by mouth every 12 (twelve) hours., Disp: , Rfl: 0 .  B Complex-Biotin-FA (SUPER B-COMPLEX) TABS, Take 1 tablet by mouth daily., Disp: , Rfl:  .  cetirizine (ZYRTEC) 10 MG tablet, Take 10 mg by mouth daily as needed for allergies. , Disp: , Rfl:  .  fluticasone (FLONASE) 50 MCG/ACT nasal spray, Place 2 sprays into both nostrils as needed for allergies or rhinitis., Disp: , Rfl:  .  metoprolol tartrate (LOPRESSOR) 25 MG tablet, Take 1 tablet (25 mg total) by mouth 2 (two) times daily., Disp: 28 tablet, Rfl: 0 .  nitroGLYCERIN (NITROSTAT) 0.4 MG SL tablet, Place 1 tablet (0.4 mg total) under the tongue every 5 (five) minutes as needed for chest pain., Disp: 25 tablet, Rfl: 3 .  pravastatin (PRAVACHOL) 80 MG tablet, Take 1 tablet (80 mg total) by mouth daily., Disp: 90 tablet, Rfl: 3 .  VENTOLIN HFA 108 (90 Base) MCG/ACT inhaler, Inhale 2 puffs into  the lungs every 4 (four) hours as needed. Shortness of breath or wheezing, Disp: , Rfl: 1  Past Medical History: Past Medical History  Diagnosis Date  . Hypertension   . Seasonal allergies   . Allergic rhinitis     /asthma  . Hypercholesteremia   . ED (erectile dysfunction)   . 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 Childhood asthma   . Obstructive sleep apnea on CPAP   . Osteoarthritis     OA knees, shoulders; DDD lumbar spine  . Arthritis     "shoulders, back, knees" (09/16/2015)  . CAD (coronary artery disease), native coronary artery 09/15/2015    s/p NSTEMI 09/2015 with cath showing 99% RCA s/p DES now on DAPT  . Hyperlipidemia LDL goal <70     Tobacco Use: History  Smoking status  . Former Smoker  Smokeless tobacco  . Never Used    Comment: "smoked 2 packs of cigaretes in my whole life"    Labs: Recent Review Flowsheet Data    Labs for ITP Cardiac and Pulmonary Rehab Latest Ref Rng 09/15/2015 09/16/2015 12/03/2015   Cholestrol 125 - 200 mg/dL [id number]   LDLCALC <865<130 mg/dL 784(O101(H) 85 88   HDL >=96>=40 mg/dL 29(B36(L) 28(U33(L) 46   Trlycerides <150 mg/dL 132(G161(H) [id number] 81      Capillary Blood Glucose: No results found for: GLUCAP   Exercise Target Goals:    Exercise Program Goal: Individual exercise prescription  set with THRR, safety & activity barriers. Participant demonstrates ability to understand and report RPE using BORG scale, to self-measure pulse accurately, and to acknowledge the importance of the exercise prescription.  Exercise Prescription Goal: Starting with aerobic activity 30 plus minutes a day, 3 days per week for initial exercise prescription. Provide home exercise prescription and guidelines that participant acknowledges understanding prior to discharge.  Activity Barriers & Risk Stratification:     Activity Barriers & Cardiac Risk Stratification - 11/12/15 1424    Activity Barriers & Cardiac Risk Stratification   Activity Barriers Arthritis;Back  Problems;Joint Problems  Arth in knees, back, shoulders; torn rotator cuff bilateral shoulder (r has been repaired)   Cardiac Risk Stratification High      6 Minute Walk:     6 Minute Walk      11/12/15 1531       6 Minute Walk   Phase Initial     Distance 1913 feet     Walk Time 6 minutes     # of Rest Breaks 0     MPH 3.62     METS 4.83     RPE 11     VO2 Peak 16.91     Symptoms No     Resting HR 72 bpm     Resting BP 126/82 mmHg     Max Ex. HR 115 bpm     Max Ex. BP 150/80 mmHg     2 Minute Post BP 120/76 mmHg        Initial Exercise Prescription:     Initial Exercise Prescription - 11/12/15 1500    Date of Initial Exercise RX and Referring Provider   Date 11/12/15   Referring Provider Armanda Magic MD   Treadmill   MPH 3   Grade 3   Minutes 10   METs 4.54   Bike   Level 2.3   Minutes 10   METs 4.77   NuStep   Level 3   Minutes 10   METs 3   Prescription Details   Frequency (times per week) 3   Duration Progress to 30 minutes of continuous aerobic without signs/symptoms of physical distress   Intensity   THRR 40-80% of Max Heartrate 84-134   Ratings of Perceived Exertion 11-13   Perceived Dyspnea 0-4   Progression   Progression Continue to progress workloads to maintain intensity without signs/symptoms of physical distress.   Resistance Training   Training Prescription Yes   Weight 3 lbs.   Reps 10-12      Perform Capillary Blood Glucose checks as needed.  Exercise Prescription Changes:     Exercise Prescription Changes      11/20/15 1445 12/06/15 1700 12/23/15 0700 01/03/16 1500 01/21/16 1000   Exercise Review   Progression  Yes Yes Yes Yes   Response to Exercise   Blood Pressure (Admit) 130/84 mmHg 120/80 mmHg 124/78 mmHg 110/80 mmHg 134/80 mmHg   Blood Pressure (Exercise) 160/80 mmHg 158/82 mmHg 146/84 mmHg 152/60 mmHg 146/80 mmHg   Blood Pressure (Exit) 138/78 mmHg 122/62 mmHg 130/80 mmHg 130/80 mmHg 118/84 mmHg   Heart Rate  (Admit) 97 bpm 79 bpm 80 bpm 80 bpm 76 bpm   Heart Rate (Exercise) 143 bpm 146 bpm 146 bpm 145 bpm 138 bpm   Heart Rate (Exit) 95 bpm 85 bpm 84 bpm 78 bpm 74 bpm   Perceived Dyspnea (Exercise) 11 13 12 12 12    Symptoms none none none none none   Comments  Home exercise given 11/27/15. Home exercise given 11/27/15. Home exercise given 11/27/15. Home exercise given 11/27/15.   Duration Progress to 30 minutes of continuous aerobic without signs/symptoms of physical distress Progress to 30 minutes of continuous aerobic without signs/symptoms of physical distress Progress to 30 minutes of continuous aerobic without signs/symptoms of physical distress Progress to 30 minutes of continuous aerobic without signs/symptoms of physical distress Progress to 30 minutes of continuous aerobic without signs/symptoms of physical distress   Intensity THRR unchanged THRR unchanged THRR unchanged THRR unchanged THRR unchanged   Progression   Progression Continue to progress workloads to maintain intensity without signs/symptoms of physical distress. Continue to progress workloads to maintain intensity without signs/symptoms of physical distress. Continue to progress workloads to maintain intensity without signs/symptoms of physical distress. Continue to progress workloads to maintain intensity without signs/symptoms of physical distress. Continue to progress workloads to maintain intensity without signs/symptoms of physical distress.   Average METs 3.8 4.7 4.8 5.3 5.4   Resistance Training   Training Prescription Yes Yes Yes Yes Yes   Weight 3 lbs. 5 lbs 5 lbs 5 lbs 5 lbs   Reps 10-12 10-12 10-12 10-12 10-12   Interval Training   Interval Training No No No No No   Treadmill   MPH 3 3.5 3.5 3.7 3.7   Grade 3 5 5 5 5    Minutes 10 10 10 10 10    METs 4.54 6.09 6.09 6.38 6.38   Bike   Level 1.3 2 2.2 2.2 2.2   Minutes 10 10 10 10 10    METs 3.18 4.3 4.32 4.69 4.67   NuStep   Level 1 5 5 5 5    Minutes 10 10 10 10 10     METs 2 3.7 3.9 4.8 5.2   Home Exercise Plan   Plans to continue exercise at  Home Home Home Home   Frequency  Add 4 additional days to program exercise sessions. Add 4 additional days to program exercise sessions. Add 4 additional days to program exercise sessions. Add 4 additional days to program exercise sessions.     01/22/16 1700           Response to Exercise   Blood Pressure (Admit) 124/74 mmHg       Blood Pressure (Exercise) 142/70 mmHg       Blood Pressure (Exit) 122/82 mmHg       Heart Rate (Admit) 72 bpm       Heart Rate (Exercise) 144 bpm       Heart Rate (Exit) 76 bpm       Perceived Dyspnea (Exercise) 12       Symptoms none       Comments Home exercise given 11/27/15.       Duration Progress to 30 minutes of continuous aerobic without signs/symptoms of physical distress       Intensity THRR unchanged       Progression   Progression Continue to progress workloads to maintain intensity without signs/symptoms of physical distress.       Average METs 5.2       Resistance Training   Training Prescription Yes       Weight 5 lbs       Reps 10-12       Interval Training   Interval Training No       Treadmill   MPH 3.7       Grade 5       Minutes 10  METs 6.38       Bike   Level 2.2       Minutes 10       METs 4.67       NuStep   Level 5       Minutes 10       METs 4.6       Home Exercise Plan   Plans to continue exercise at Home       Frequency Add 4 additional days to program exercise sessions.          Exercise Comments:     Exercise Comments      11/25/15 0831 12/06/15 1711 12/20/15 0746 01/03/16 1528 01/15/16 1025   Exercise Comments Pt off to a good start with exercise. Doing well with exercise. Home exercise guidelines given on 11/27/15. Continues to progress well with exercise, increasing workloads consistently. Doing well with exercise. Sporadic attendance due to work schedule. Doing well when here.     01/22/16 1739           Exercise  Comments Doing well with exercise. Pt is walking on his off days.          Discharge Exercise Prescription (Final Exercise Prescription Changes):     Exercise Prescription Changes - 01/22/16 1700    Response to Exercise   Blood Pressure (Admit) 124/74 mmHg   Blood Pressure (Exercise) 142/70 mmHg   Blood Pressure (Exit) 122/82 mmHg   Heart Rate (Admit) 72 bpm   Heart Rate (Exercise) 144 bpm   Heart Rate (Exit) 76 bpm   Perceived Dyspnea (Exercise) 12   Symptoms none   Comments Home exercise given 11/27/15.   Duration Progress to 30 minutes of continuous aerobic without signs/symptoms of physical distress   Intensity THRR unchanged   Progression   Progression Continue to progress workloads to maintain intensity without signs/symptoms of physical distress.   Average METs 5.2   Resistance Training   Training Prescription Yes   Weight 5 lbs   Reps 10-12   Interval Training   Interval Training No   Treadmill   MPH 3.7   Grade 5   Minutes 10   METs 6.38   Bike   Level 2.2   Minutes 10   METs 4.67   NuStep   Level 5   Minutes 10   METs 4.6   Home Exercise Plan   Plans to continue exercise at Home   Frequency Add 4 additional days to program exercise sessions.      Nutrition:  Target Goals: Understanding of nutrition guidelines, daily intake of sodium 1500mg , cholesterol 200mg , calories 30% from fat and 7% or less from saturated fats, daily to have 5 or more servings of fruits and vegetables.  Biometrics:     Pre Biometrics - 11/12/15 1532    Pre Biometrics   Height 5' 11.25" (1.81 m)   Weight 253 lb 4.9 oz (114.9 kg)   Waist Circumference 40.25 inches   Hip Circumference 41.25 inches   Waist to Hip Ratio 0.98 %   BMI (Calculated) 35.2   Triceps Skinfold 27 mm   % Body Fat 31.8 %   Grip Strength 59.5 kg   Flexibility 9.5 in   Single Leg Stand 30 seconds       Nutrition Therapy Plan and Nutrition Goals:     Nutrition Therapy & Goals - 11/14/15 1352     Nutrition Therapy   Diet Therapeutic Lifestyle Changes   Personal Nutrition Goals  Personal Goal #1 0.5-2 lb wt loss per week to a goal wt loss of 6-24 lb at graduation from Cardiac Rehab   Intervention Plan   Intervention Prescribe, educate and counsel regarding individualized specific dietary modifications aiming towards targeted core components such as weight, hypertension, lipid management, diabetes, heart failure and other comorbidities.   Expected Outcomes Short Term Goal: Understand basic principles of dietary content, such as calories, fat, sodium, cholesterol and nutrients.;Long Term Goal: Adherence to prescribed nutrition plan.      Nutrition Discharge: Nutrition Scores:     Nutrition Assessments - 12/04/15 1601    MEDFICTS Scores   Pre Score 18      Nutrition Goals Re-Evaluation:   Psychosocial: Target Goals: Acknowledge presence or absence of depression, maximize coping skills, provide positive support system. Participant is able to verbalize types and ability to use techniques and skills needed for reducing stress and depression.  Initial Review & Psychosocial Screening:     Initial Psych Review & Screening - 11/14/15 1017    Family Dynamics   Good Support System? Yes   Barriers   Psychosocial barriers to participate in program There are no identifiable barriers or psychosocial needs.   Screening Interventions   Interventions Encouraged to exercise      Quality of Life Scores:     Quality of Life - 11/12/15 1546    Quality of Life Scores   Health/Function Pre (p) 22.4 %   Socioeconomic Pre (p) 24 %   Psych/Spiritual Pre (p) 24 %   Family Pre (p) 27.6 %   GLOBAL Pre (p) 23.83 %      PHQ-9:     Recent Review Flowsheet Data    Depression screen [facility] 2/9 01/11/2016 11/20/2015 01/11/2015   Decreased Interest 0 0 0   Down, Depressed, Hopeless 0 0 0   PHQ - 2 Score 0 0 0      Psychosocial Evaluation and Intervention:   Psychosocial Re-Evaluation:      Psychosocial Re-Evaluation      11/28/15 1405 12/27/15 1349 01/23/16 1521       Psychosocial Re-Evaluation   Interventions Encouraged to attend Cardiac Rehabilitation for the exercise Encouraged to attend Cardiac Rehabilitation for the exercise Encouraged to attend Cardiac Rehabilitation for the exercise     Continued Psychosocial Services Needed No No No        Vocational Rehabilitation: Provide vocational rehab assistance to qualifying candidates.   Vocational Rehab Evaluation & Intervention:     Vocational Rehab - 11/13/15 1656    Initial Vocational Rehab Evaluation & Intervention   Assessment shows need for Vocational Rehabilitation No      Education: Education Goals: Education classes will be provided on a weekly basis, covering required topics. Participant will state understanding/return demonstration of topics presented.  Learning Barriers/Preferences:     Learning Barriers/Preferences - 11/12/15 1540    Learning Barriers/Preferences   Learning Barriers None   Learning Preferences Audio;Verbal Instruction;Written Material;Group Instruction;Individual Instruction      Education Topics: Count Your Pulse:  -Group instruction provided by verbal instruction, demonstration, patient participation and written materials to support subject.  Instructors address importance of being able to find your pulse and how to count your pulse when at home without a heart monitor.  Patients get hands on experience counting their pulse with staff help and individually.   Heart Attack, Angina, and Risk Factor Modification:  -Group instruction provided by verbal instruction, video, and written materials to support subject.  Instructors address signs  and symptoms of angina and heart attacks.    Also discuss risk factors for heart disease and how to make changes to improve heart health risk factors.   Functional Fitness:  -Group instruction provided by verbal instruction, demonstration,  patient participation, and written materials to support subject.  Instructors address safety measures for doing things around the house.  Discuss how to get up and down off the floor, how to pick things up properly, how to safely get out of a chair without assistance, and balance training.   Meditation and Mindfulness:  -Group instruction provided by verbal instruction, patient participation, and written materials to support subject.  Instructor addresses importance of mindfulness and meditation practice to help reduce stress and improve awareness.  Instructor also leads participants through a meditation exercise.    Stretching for Flexibility and Mobility:  -Group instruction provided by verbal instruction, patient participation, and written materials to support subject.  Instructors lead participants through series of stretches that are designed to increase flexibility thus improving mobility.  These stretches are additional exercise for major muscle groups that are typically performed during regular warm up and cool down.   Hands Only CPR Anytime:  -Group instruction provided by verbal instruction, video, patient participation and written materials to support subject.  Instructors co-teach with AHA video for hands only CPR.  Participants get hands on experience with mannequins.   Nutrition I class: Heart Healthy Eating:  -Group instruction provided by PowerPoint slides, verbal discussion, and written materials to support subject matter. The instructor gives an explanation and review of the Therapeutic Lifestyle Changes diet recommendations, which includes a discussion on lipid goals, dietary fat, sodium, fiber, plant stanol/sterol esters, sugar, and the components of a well-balanced, healthy diet.          CARDIAC REHAB PHASE II EXERCISE from 12/04/2015 in [facility] CARDIAC REHAB   Date  12/04/15   Educator  RD   Instruction Review Code  Not applicable [class handout given]       Nutrition II class: Lifestyle Skills:  -Group instruction provided by PowerPoint slides, verbal discussion, and written materials to support subject matter. The instructor gives an explanation and review of label reading, grocery shopping for heart health, heart healthy recipe modifications, and ways to make healthier choices when eating out.      CARDIAC REHAB PHASE II EXERCISE from 12/04/2015 in [facility] CARDIAC REHAB   Date  12/04/15   Educator  RD   Instruction Review Code  Not applicable [class handouts given]      Diabetes Question & Answer:  -Group instruction provided by PowerPoint slides, verbal discussion, and written materials to support subject matter. The instructor gives an explanation and review of diabetes co-morbidities, pre- and post-prandial blood glucose goals, pre-exercise blood glucose goals, signs, symptoms, and treatment of hypoglycemia and hyperglycemia, and foot care basics.   Diabetes Blitz:  -Group instruction provided by PowerPoint slides, verbal discussion, and written materials to support subject matter. The instructor gives an explanation and review of the physiology behind type 1 and type 2 diabetes, diabetes medications and rational behind using different medications, pre- and post-prandial blood glucose recommendations and Hemoglobin A1c goals, diabetes diet, and exercise including blood glucose guidelines for exercising safely.    Portion Distortion:  -Group instruction provided by PowerPoint slides, verbal discussion, written materials, and food models to support subject matter. The instructor gives an explanation of serving size versus portion size, changes in portions sizes over the last  20 years, and what consists of a serving from each food group.   Stress Management:  -Group instruction provided by verbal instruction, video, and written materials to support subject matter.  Instructors review role of stress in heart disease  and how to cope with stress positively.     Exercising on Your Own:  -Group instruction provided by verbal instruction, power point, and written materials to support subject.  Instructors discuss benefits of exercise, components of exercise, frequency and intensity of exercise, and end points for exercise.  Also discuss use of nitroglycerin and activating EMS.  Review options of places to exercise outside of rehab.  Review guidelines for sex with heart disease.   Cardiac Drugs I:  -Group instruction provided by verbal instruction and written materials to support subject.  Instructor reviews cardiac drug classes: antiplatelets, anticoagulants, beta blockers, and statins.  Instructor discusses reasons, side effects, and lifestyle considerations for each drug class.   Cardiac Drugs II:  -Group instruction provided by verbal instruction and written materials to support subject.  Instructor reviews cardiac drug classes: angiotensin converting enzyme inhibitors (ACE-I), angiotensin II receptor blockers (ARBs), nitrates, and calcium channel blockers.  Instructor discusses reasons, side effects, and lifestyle considerations for each drug class.   Anatomy and Physiology of the Circulatory System:  -Group instruction provided by verbal instruction, video, and written materials to support subject.  Reviews functional anatomy of heart, how it relates to various diagnoses, and what role the heart plays in the overall system.   Knowledge Questionnaire Score:     Knowledge Questionnaire Score - 11/12/15 1541    Knowledge Questionnaire Score   Pre Score 22/24      Core Components/Risk Factors/Patient Goals at Admission:     Personal Goals and Risk Factors at Admission - 11/12/15 1543    Core Components/Risk Factors/Patient Goals on Admission   Admit Weight 253 lb 4.9 oz (114.9 kg)   Goal Weight: Short Term 247 lb (112.038 kg)   Hypertension Yes   Intervention Provide education on lifestyle  modifcations including regular physical activity/exercise, weight management, moderate sodium restriction and increased consumption of fresh fruit, vegetables, and low fat dairy, alcohol moderation, and smoking cessation.;Monitor prescription use compliance.   Expected Outcomes Short Term: Continued assessment and intervention until BP is < 140/70mm HG in hypertensive participants. < 130/27mm HG in hypertensive participants with diabetes, heart failure or chronic kidney disease.;Long Term: Maintenance of blood pressure at goal levels.   Lipids Yes   Intervention Provide education and support for participant on nutrition & aerobic/resistive exercise along with prescribed medications to achieve LDL 70mg , HDL >40mg .   Expected Outcomes Short Term: Participant states understanding of desired cholesterol values and is compliant with medications prescribed. Participant is following exercise prescription and nutrition guidelines.;Long Term: Cholesterol controlled with medications as prescribed, with individualized exercise RX and with personalized nutrition plan. Value goals: LDL < 70mg , HDL > 40 mg.      Core Components/Risk Factors/Patient Goals Review:      Goals and Risk Factor Review      12/04/15 1601 12/23/15 0748 01/15/16 1025 01/22/16 1741     Core Components/Risk Factors/Patient Goals Review   Personal Goals Review Weight Management/Obesity Weight Management/Obesity;Other Weight Management/Obesity;Other Weight Management/Obesity;Other;Hypertension;Increase Strength and Stamina    Review see 12/04/15 RD note; wt today down 2.5 kg Pt is almost at short term goal weight. Current weight is 247.7 lbs. Pt has returned to outdoor cycling ~5 miles, 30-35 minutes 2x's/week. Current weight is 249.9. Making progress  when at cardiac rehab.  Pt has lost 3.3 lbs since admission. Pt has been out due to work schedule but states he's walking at home. Blood pressure better at rest and with exercise.    Expected  Outcomes slow wt loss of 0.5-2 lb/week Continue exercise program 30-35 minutes, 5-7 days per week to maintain heart health and weight loss. Continue exercise program 30-35 minutes, 5-7 days per week to maintain heart health and weight loss. Continue exercise program 30-35 minutes, 5-7 days per week to maintain heart health and weight loss.       Core Components/Risk Factors/Patient Goals at Discharge (Final Review):      Goals and Risk Factor Review - 01/22/16 1741    Core Components/Risk Factors/Patient Goals Review   Personal Goals Review Weight Management/Obesity;Other;Hypertension;Increase Strength and Stamina   Review Pt has lost 3.3 lbs since admission. Pt has been out due to work schedule but states he's walking at home. Blood pressure better at rest and with exercise.   Expected Outcomes Continue exercise program 30-35 minutes, 5-7 days per week to maintain heart health and weight loss.      ITP Comments:     ITP Comments      11/12/15 1355           ITP Comments Dr. Armanda Magic, Medical Director           Comments: Pt is making expected progress toward personal goals after completing 15 sessions. Recommend continued exercise and life style modification education including  stress management and relaxation techniques to decrease cardiac risk profile. Kincade has had some absences due to his work obligations of a [employer]. Jatavious is enjoying participating in phase 2 cardiac rehab.Gladstone Lighter, RN,BSN 01/23/2016 3:29 PM

## 2016-01-24 ENCOUNTER — Encounter (HOSPITAL_COMMUNITY)
Admission: RE | Admit: 2016-01-24 | Discharge: 2016-01-24 | Disposition: A | Discharge status: Home / Self Care | Payer: 59 | Source: Ambulatory Visit | Attending: Cardiology | Admitting: Cardiology

## 2016-01-24 ENCOUNTER — Other Ambulatory Visit (INDEPENDENT_AMBULATORY_CARE_PROVIDER_SITE_OTHER): Payer: 59 | Admitting: *Deleted

## 2016-01-24 DIAGNOSIS — I214 Non-ST elevation (NSTEMI) myocardial infarction: Secondary | ICD-10-CM | POA: Diagnosis not present

## 2016-01-24 DIAGNOSIS — E7800 Pure hypercholesterolemia, unspecified: Secondary | ICD-10-CM

## 2016-01-24 DIAGNOSIS — Z955 Presence of coronary angioplasty implant and graft: Secondary | ICD-10-CM

## 2016-01-24 LAB — LIPID PANEL
CHOLESTEROL: 165 mg/dL (ref 125–200)
HDL: 50 mg/dL (ref >=40)
LDL Cholesterol: 98 mg/dL (ref <130)
TRIGLYCERIDES: 85 mg/dL (ref <150)
Total CHOL/HDL Ratio: 3.3 Ratio (ref <=5.0)
VLDL: 17 mg/dL (ref <30)

## 2016-01-24 LAB — HEPATIC FUNCTION PANEL
ALBUMIN: 4 g/dL (ref 3.6–5.1)
ALT: 11 U/L (ref 9–46)
AST: 17 U/L (ref 10–35)
Alkaline Phosphatase: 64 U/L (ref 40–115)
BILIRUBIN TOTAL: 0.4 mg/dL (ref 0.2–1.2)
Bilirubin, Direct: 0.1 mg/dL (ref <=0.2)
Indirect Bilirubin: 0.3 mg/dL (ref 0.2–1.2)
Total Protein: 6.8 g/dL (ref 6.1–8.1)

## 2016-01-24 NOTE — Addendum Note (Signed)
Addended by: Tonita PhoenixBOWDEN, Raeana Blinn K on: 01/24/2016 07:36 AM   Modules accepted: Orders

## 2016-01-27 ENCOUNTER — Other Ambulatory Visit: Payer: Self-pay | Admitting: *Deleted

## 2016-01-27 DIAGNOSIS — E7800 Pure hypercholesterolemia, unspecified: Secondary | ICD-10-CM

## 2016-01-27 MED ORDER — EZETIMIBE 10 MG PO TABS: 10.0000 mg | ORAL_TABLET | Freq: Every day | ORAL | 3 refills | Status: DC

## 2016-01-29 ENCOUNTER — Encounter (HOSPITAL_COMMUNITY)
Admission: RE | Admit: 2016-01-29 | Discharge: 2016-01-29 | Disposition: A | Discharge status: Home / Self Care | Payer: 59 | Source: Ambulatory Visit | Attending: Cardiology | Admitting: Cardiology

## 2016-01-29 DIAGNOSIS — I214 Non-ST elevation (NSTEMI) myocardial infarction: Secondary | ICD-10-CM

## 2016-01-29 DIAGNOSIS — Z955 Presence of coronary angioplasty implant and graft: Secondary | ICD-10-CM

## 2016-01-29 NOTE — Progress Notes (Signed)
Nutrition Note Spoke with pt. Pt concerned about his recent cholesterol panel. Nutrition recommendations for lowering lipids discussed. Pt expressed understanding of information reviewed via feedback method. Continue client-centered nutrition education by RD as part of interdisciplinary care.  Monitor and evaluate progress toward nutrition goal with team.  Mickle Plumb, M.Ed, RD, LDN, CDE 01/29/2016 3:50 PM

## 2016-01-31 ENCOUNTER — Encounter (HOSPITAL_COMMUNITY)
Admission: RE | Admit: 2016-01-31 | Discharge: 2016-01-31 | Disposition: A | Discharge status: Home / Self Care | Payer: 59 | Source: Ambulatory Visit | Attending: Cardiology | Admitting: Cardiology

## 2016-01-31 ENCOUNTER — Other Ambulatory Visit: Payer: 59

## 2016-01-31 DIAGNOSIS — I214 Non-ST elevation (NSTEMI) myocardial infarction: Secondary | ICD-10-CM | POA: Diagnosis not present

## 2016-02-03 ENCOUNTER — Telehealth (HOSPITAL_COMMUNITY): Payer: Self-pay | Admitting: *Deleted

## 2016-02-03 ENCOUNTER — Encounter (HOSPITAL_COMMUNITY): Admission: RE | Admit: 2016-02-03 | Payer: 59 | Source: Ambulatory Visit

## 2016-02-05 ENCOUNTER — Encounter (HOSPITAL_COMMUNITY)
Admission: RE | Admit: 2016-02-05 | Discharge: 2016-02-05 | Disposition: A | Discharge status: Home / Self Care | Payer: 59 | Source: Ambulatory Visit | Attending: Cardiology | Admitting: Cardiology

## 2016-02-05 DIAGNOSIS — E785 Hyperlipidemia, unspecified: Secondary | ICD-10-CM | POA: Insufficient documentation

## 2016-02-05 DIAGNOSIS — I10 Essential (primary) hypertension: Secondary | ICD-10-CM | POA: Diagnosis not present

## 2016-02-05 DIAGNOSIS — Z79899 Other long term (current) drug therapy: Secondary | ICD-10-CM | POA: Insufficient documentation

## 2016-02-05 DIAGNOSIS — E7800 Pure hypercholesterolemia, unspecified: Secondary | ICD-10-CM | POA: Insufficient documentation

## 2016-02-05 DIAGNOSIS — Z955 Presence of coronary angioplasty implant and graft: Secondary | ICD-10-CM | POA: Insufficient documentation

## 2016-02-05 DIAGNOSIS — I214 Non-ST elevation (NSTEMI) myocardial infarction: Secondary | ICD-10-CM | POA: Diagnosis present

## 2016-02-07 ENCOUNTER — Encounter (HOSPITAL_COMMUNITY)
Admission: RE | Admit: 2016-02-07 | Discharge: 2016-02-07 | Disposition: A | Discharge status: Home / Self Care | Payer: 59 | Source: Ambulatory Visit | Attending: Cardiology | Admitting: Cardiology

## 2016-02-07 DIAGNOSIS — Z955 Presence of coronary angioplasty implant and graft: Secondary | ICD-10-CM

## 2016-02-07 DIAGNOSIS — I214 Non-ST elevation (NSTEMI) myocardial infarction: Secondary | ICD-10-CM | POA: Diagnosis not present

## 2016-02-10 ENCOUNTER — Encounter (HOSPITAL_COMMUNITY): Admission: RE | Admit: 2016-02-10 | Payer: 59 | Source: Ambulatory Visit

## 2016-02-10 ENCOUNTER — Telehealth (HOSPITAL_COMMUNITY): Payer: Self-pay | Admitting: *Deleted

## 2016-02-12 ENCOUNTER — Encounter (HOSPITAL_COMMUNITY)
Admission: RE | Admit: 2016-02-12 | Discharge: 2016-02-12 | Disposition: A | Discharge status: Home / Self Care | Payer: 59 | Source: Ambulatory Visit | Attending: Cardiology | Admitting: Cardiology

## 2016-02-12 DIAGNOSIS — I214 Non-ST elevation (NSTEMI) myocardial infarction: Secondary | ICD-10-CM | POA: Diagnosis not present

## 2016-02-12 DIAGNOSIS — Z955 Presence of coronary angioplasty implant and graft: Secondary | ICD-10-CM

## 2016-02-14 ENCOUNTER — Encounter (HOSPITAL_COMMUNITY)
Admission: RE | Admit: 2016-02-14 | Discharge: 2016-02-14 | Disposition: A | Discharge status: Home / Self Care | Payer: 59 | Source: Ambulatory Visit | Attending: Cardiology | Admitting: Cardiology

## 2016-02-14 DIAGNOSIS — I214 Non-ST elevation (NSTEMI) myocardial infarction: Secondary | ICD-10-CM | POA: Diagnosis not present

## 2016-02-14 DIAGNOSIS — Z955 Presence of coronary angioplasty implant and graft: Secondary | ICD-10-CM

## 2016-02-17 ENCOUNTER — Encounter (HOSPITAL_COMMUNITY): Payer: 59

## 2016-02-18 NOTE — Progress Notes (Signed)
Cardiac Individual Treatment Plan  Patient Details  Name: Raymond Jordan MRN: [id number] Date of Birth: 1960-07-12 Referring Provider:   Flowsheet Row CARDIAC REHAB PHASE II ORIENTATION from 11/12/2015 in [place]  Referring Provider  Raymond Him MD      Initial Encounter Date:  [place] PHASE II ORIENTATION from 11/12/2015 in [place]  Date  11/12/15  Referring Provider  Raymond Him MD      Visit Diagnosis: NSTEMI (non-ST elevated myocardial infarction) [facility])  Stented coronary artery  Patient's Home Medications on Admission:  Current Outpatient Prescriptions:  .  AMBULATORY NON FORMULARY MEDICATION, Take 90 mg by mouth 2 (two) times daily. Medication Name: BRILINTA 90 mg BID (TWILIGHT Research study provided do not fill), Disp: , Rfl:  .  AMBULATORY NON FORMULARY MEDICATION, Take 81 mg by mouth daily. Medication Name: Aspirin 81 mg daily or [place] research study provided), Disp: , Rfl:  .  amoxicillin-clavulanate (AUGMENTIN) 875-125 MG tablet, Take 1 tablet by mouth every 12 (twelve) hours., Disp: , Rfl: 0 .  B Complex-Biotin-FA (SUPER B-COMPLEX) TABS, Take 1 tablet by mouth daily., Disp: , Rfl:  .  cetirizine (ZYRTEC) 10 MG tablet, Take 10 mg by mouth daily as needed for allergies. , Disp: , Rfl:  .  ezetimibe (ZETIA) 10 MG tablet, Take 1 tablet (10 mg total) by mouth daily., Disp: 90 tablet, Rfl: 3 .  fluticasone (FLONASE) 50 MCG/ACT nasal spray, Place 2 sprays into both nostrils as needed for allergies or rhinitis., Disp: , Rfl:  .  metoprolol tartrate (LOPRESSOR) 25 MG tablet, Take 1 tablet (25 mg total) by mouth 2 (two) times daily., Disp: 28 tablet, Rfl: 0 .  nitroGLYCERIN (NITROSTAT) 0.4 MG SL tablet, Place 1 tablet (0.4 mg total) under the tongue every 5 (five) minutes as needed for chest pain., Disp: 25 tablet, Rfl: 3 .  pravastatin (PRAVACHOL) 80 MG tablet, Take 1 tablet (80  mg total) by mouth daily., Disp: 90 tablet, Rfl: 3 .  VENTOLIN HFA 108 (90 Base) MCG/ACT inhaler, Inhale 2 puffs into the lungs every 4 (four) hours as needed. Shortness of breath or wheezing, Disp: , Rfl: 1  Past Medical History: Past Medical History:  Diagnosis Date  . Allergic rhinitis    /asthma  . Arthritis    "shoulders, back, knees" (09/16/2015)  . CAD (coronary artery disease), native coronary artery 09/15/2015   s/p NSTEMI 09/2015 with cath showing 99% RCA s/p DES now on DAPT  . Childhood asthma   . ED (erectile dysfunction)   . Hypercholesteremia   . Hyperlipidemia LDL goal <70   . Hypertension   . 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 Obstructive sleep apnea on CPAP   . Osteoarthritis    OA knees, shoulders; DDD lumbar spine  . Seasonal allergies     Tobacco Use: History  Smoking Status  . Former Smoker  Smokeless Tobacco  . Never Used    Comment: "smoked 2 packs of cigaretes in my whole life"    Labs: Recent Review Flowsheet Data    Labs for ITP Cardiac and Pulmonary Rehab Latest Ref Rng & Units 09/15/2015 09/16/2015 12/03/2015 01/24/2016   Cholestrol 125 - 200 mg/dL 169 142 150 165   LDLCALC <130 mg/dL 101(H) 85 88 98   HDL >=40 mg/dL 36(L) 33(L) 46 50   Trlycerides <150 mg/dL 161(H) 120 81 85      Capillary Blood Glucose: No results found for:  GLUCAP   Exercise Target Goals:    Exercise Program Goal: Individual exercise prescription set with THRR, safety & activity barriers. Participant demonstrates ability to understand and report RPE using BORG scale, to self-measure pulse accurately, and to acknowledge the importance of the exercise prescription.  Exercise Prescription Goal: Starting with aerobic activity 30 plus minutes a day, 3 days per week for initial exercise prescription. Provide home exercise prescription and guidelines that participant acknowledges understanding prior to discharge.  Activity Barriers & Risk Stratification:     Activity Barriers  & Cardiac Risk Stratification - 11/12/15 1424      Activity Barriers & Cardiac Risk Stratification   Activity Barriers Arthritis;Back Problems;Joint Problems  Arth in knees, back, shoulders; torn rotator cuff bilateral shoulder (r has been repaired)   Cardiac Risk Stratification High      6 Minute Walk:     6 Minute Walk    Row Name 11/12/15 1531         6 Minute Walk   Phase Initial     Distance 1913 feet     Walk Time 6 minutes     # of Rest Breaks 0     MPH 3.62     METS 4.83     RPE 11     VO2 Peak 16.91     Symptoms No     Resting HR 72 bpm     Resting BP 126/82     Max Ex. HR 115 bpm     Max Ex. BP 150/80     2 Minute Post BP 120/76        Initial Exercise Prescription:     Initial Exercise Prescription - 11/12/15 1500      Date of Initial Exercise RX and Referring Provider   Date 11/12/15   Referring Provider Raymond Him MD     Treadmill   MPH 3   Grade 3   Minutes 10   METs 4.54     Bike   Level 2.3   Minutes 10   METs 4.77     NuStep   Level 3   Minutes 10   METs 3     Prescription Details   Frequency (times per week) 3   Duration Progress to 30 minutes of continuous aerobic without signs/symptoms of physical distress     Intensity   THRR 40-80% of Max Heartrate 84-134   Ratings of Perceived Exertion 11-13   Perceived Dyspnea 0-4     Progression   Progression Continue to progress workloads to maintain intensity without signs/symptoms of physical distress.     Resistance Training   Training Prescription Yes   Weight 3 lbs.   Reps 10-12      Perform Capillary Blood Glucose checks as needed.  Exercise Prescription Changes:      Exercise Prescription Changes    Row Name 11/20/15 1445 12/06/15 1700 12/23/15 0700 01/03/16 1500 01/21/16 1000     Exercise Review   Progression  - Yes Yes Yes Yes     Response to Exercise   Blood Pressure (Admit) 130/84 120/80 124/78 110/80 134/80   Blood Pressure (Exercise) 160/80 158/82  146/84 152/60 146/80   Blood Pressure (Exit) 138/78 122/62 130/80 130/80 118/84   Heart Rate (Admit) 97 bpm 79 bpm 80 bpm 80 bpm 76 bpm   Heart Rate (Exercise) 143 bpm 146 bpm 146 bpm 145 bpm 138 bpm   Heart Rate (Exit) 95 bpm 85 bpm 84 bpm 78 bpm 74  bpm   Perceived Dyspnea (Exercise) '11 13 12 12 12   ' Symptoms none none none none none   Comments  - Home exercise given 11/27/15. Home exercise given 11/27/15. Home exercise given 11/27/15. Home exercise given 11/27/15.   Duration Progress to 30 minutes of continuous aerobic without signs/symptoms of physical distress Progress to 30 minutes of continuous aerobic without signs/symptoms of physical distress Progress to 30 minutes of continuous aerobic without signs/symptoms of physical distress Progress to 30 minutes of continuous aerobic without signs/symptoms of physical distress Progress to 30 minutes of continuous aerobic without signs/symptoms of physical distress   Intensity THRR unchanged THRR unchanged THRR unchanged THRR unchanged THRR unchanged     Progression   Progression Continue to progress workloads to maintain intensity without signs/symptoms of physical distress. Continue to progress workloads to maintain intensity without signs/symptoms of physical distress. Continue to progress workloads to maintain intensity without signs/symptoms of physical distress. Continue to progress workloads to maintain intensity without signs/symptoms of physical distress. Continue to progress workloads to maintain intensity without signs/symptoms of physical distress.   Average METs 3.8 4.7 4.8 5.3 5.4     Resistance Training   Training Prescription Yes Yes Yes Yes Yes   Weight 3 lbs. 5 lbs 5 lbs 5 lbs 5 lbs   Reps 10-12 10-12 10-12 10-12 10-12     Interval Training   Interval Training No No No No No     Treadmill   MPH 3 3.5 3.5 3.7 3.7   Grade '3 5 5 5 5   ' Minutes '10 10 10 10 10   ' METs 4.54 6.09 6.09 6.38 6.38     Bike   Level 1.3 2 2.2 2.2 2.2    Minutes '10 10 10 10 10   ' METs 3.18 4.3 4.32 4.69 4.67     NuStep   Level '1 5 5 5 5   ' Minutes '10 10 10 10 10   ' METs 2 3.7 3.9 4.8 5.2     Home Exercise Plan   Plans to continue exercise at  - [facility]   Frequency  - Add 4 additional days to program exercise sessions. Add 4 additional days to program exercise sessions. Add 4 additional days to program exercise sessions. Add 4 additional days to program exercise sessions.   Row Name 01/22/16 1700 02/05/16 1700 02/21/16 0700         Exercise Review   Progression  - Yes Yes       Response to Exercise   Blood Pressure (Admit) 124/74 130/84 152/84     Blood Pressure (Exercise) 142/70 108/78 138/80     Blood Pressure (Exit) 122/82 138/80 143/84     Heart Rate (Admit) 72 bpm 87 bpm 71 bpm     Heart Rate (Exercise) 144 bpm 141 bpm 141 bpm     Heart Rate (Exit) 76 bpm 82 bpm 93 bpm     Perceived Dyspnea (Exercise) '12 14 12     ' Symptoms none none none     Comments Home exercise given 11/27/15. Home exercise given 11/27/15. Home exercise given 11/27/15.     Duration Progress to 30 minutes of continuous aerobic without signs/symptoms of physical distress Progress to 30 minutes of continuous aerobic without signs/symptoms of physical distress Progress to 30 minutes of continuous aerobic without signs/symptoms of physical distress     Intensity THRR unchanged THRR unchanged THRR unchanged       Progression   Progression Continue to progress  workloads to maintain intensity without signs/symptoms of physical distress. Continue to progress workloads to maintain intensity without signs/symptoms of physical distress. Continue to progress workloads to maintain intensity without signs/symptoms of physical distress.     Average METs 5.2 5.4 5.8       Resistance Training   Training Prescription Yes Yes Yes     Weight 5 lbs 8 lbs 8 lbs     Reps 10-12 10-12 10-12       Interval Training   Interval Training No No No       Treadmill   MPH 3.7  3.7 3.7     Grade '5 5 5     ' Minutes '10 10 10     ' METs 6.38 6.38 6.38       Bike   Level 2.2 2.2 -     Minutes 10 4 -     METs 4.67 4.72 -       NuStep   Level '5 5 5     ' Minutes '10 10 10     ' METs 4.6 5 5.4       Rower   Level  - 4 4     Watts  - 71 78     Minutes  - 6 6     METs  - 5.53 5.59       Home Exercise Plan   Plans to continue exercise at [place]     Frequency Add 4 additional days to program exercise sessions. Add 4 additional days to program exercise sessions. Add 4 additional days to program exercise sessions.        Exercise Comments:      Exercise Comments    Row Name 11/25/15 0831 12/06/15 1711 12/20/15 0746 01/03/16 1528 01/15/16 1025   Exercise Comments Pt off to a good start with exercise. Doing well with exercise. Home exercise guidelines given on 11/27/15. Continues to progress well with exercise, increasing workloads consistently. Doing well with exercise. Sporadic attendance due to work schedule. Doing well when here.   [place] Name 01/22/16 1739 02/05/16 1701 02/14/16 1445       Exercise Comments Doing well with exercise. Pt is walking on his off days. Pt doing well with exercise. Changed equipment from stationary bike to rower. Doing well with exercise. Out from cardiac rehab some due to work schedule.        Discharge Exercise Prescription (Final Exercise Prescription Changes):     Exercise Prescription Changes - 02/21/16 0700      Exercise Review   Progression Yes     Response to Exercise   Blood Pressure (Admit) 152/84   Blood Pressure (Exercise) 138/80   Blood Pressure (Exit) 143/84   Heart Rate (Admit) 71 bpm   Heart Rate (Exercise) 141 bpm   Heart Rate (Exit) 93 bpm   Perceived Dyspnea (Exercise) 12   Symptoms none   Comments Home exercise given 11/27/15.   Duration Progress to 30 minutes of continuous aerobic without signs/symptoms of physical distress   Intensity THRR unchanged     Progression   Progression Continue to  progress workloads to maintain intensity without signs/symptoms of physical distress.   Average METs 5.8     Resistance Training   Training Prescription Yes   Weight 8 lbs   Reps 10-12     Interval Training   Interval Training No     Treadmill   MPH 3.7   Grade 5   Minutes 10  METs 6.38     Bike   Level --   Minutes --   METs --     NuStep   Level 5   Minutes 10   METs 5.4     Rower   Level 4   Watts 78   Minutes 6   METs 5.59     Home Exercise Plan   Plans to continue exercise at Home   Frequency Add 4 additional days to program exercise sessions.      Nutrition:  Target Goals: Understanding of nutrition guidelines, daily intake of sodium <1530m, cholesterol <2019m calories 30% from fat and 7% or less from saturated fats, daily to have 5 or more servings of fruits and vegetables.  Biometrics:     Pre Biometrics - 11/12/15 1532      Pre Biometrics   Height 5' 11.25" (1.81 m)   Weight 253 lb 4.9 oz (114.9 kg)   Waist Circumference 40.25 inches   Hip Circumference 41.25 inches   Waist to Hip Ratio 0.98 %   BMI (Calculated) 35.2   Triceps Skinfold 27 mm   % Body Fat 31.8 %   Grip Strength 59.5 kg   Flexibility 9.5 in   Single Leg Stand 30 seconds       Nutrition Therapy Plan and Nutrition Goals:     Nutrition Therapy & Goals - 01/29/16 1550      Nutrition Therapy   Diet Therapeutic Lifestyle Changes     Personal Nutrition Goals   Personal Goal #1 0.5-2 lb wt loss per week to a goal wt loss of 6-24 lb at graduation from [facility] and counsel regarding individualized specific dietary modifications aiming towards targeted core components such as weight, hypertension, lipid management, diabetes, heart failure and other comorbidities.;Nutrition handout(s) given to patient.  Handouts for High Fiber Nutrition Therapy and Fiber Content of Food given   Expected Outcomes Short Term Goal:  Understand basic principles of dietary content, such as calories, fat, sodium, cholesterol and nutrients.;Long Term Goal: Adherence to prescribed nutrition plan.      Nutrition Discharge: Nutrition Scores:     Nutrition Assessments - 12/04/15 1601      MEDFICTS Scores   Pre Score 18      Nutrition Goals Re-Evaluation:   Psychosocial: Target Goals: Acknowledge presence or absence of depression, maximize coping skills, provide positive support system. Participant is able to verbalize types and ability to use techniques and skills needed for reducing stress and depression.  Initial Review & Psychosocial Screening:     Initial Psych Review & Screening - 11/14/15 [place]     Barriers   Psychosocial barriers to participate in program There are no identifiable barriers or psychosocial needs.     Screening Interventions   Interventions Encouraged to exercise      Quality of Life Scores:     Quality of Life - 11/12/15 1546      Quality of Life Scores   Health/Function Pre (P)  22.4 %   Socioeconomic Pre (P)  24 %   Psych/Spiritual Pre (P)  24 %   Family Pre (P)  27.6 %   GLOBAL Pre (P)  23.83 %      PHQ-9: Recent Review Flowsheet Data    [facility] 01/11[facility]2016 11/20/2015 01/11/2015   Decreased Interest 0 0 0   Down,  Depressed, Hopeless 0 0 0   PHQ - 2 Score 0 0 0      Psychosocial Evaluation and Intervention:   Psychosocial Re-Evaluation:     Psychosocial Re-Evaluation    Row Name 11/28/15 1405 12/27/15 1349 01/23/16 1521 02/19/16 1727       Psychosocial Re-Evaluation   Interventions Encouraged to attend Cardiac Rehabilitation for the exercise Encouraged to attend Cardiac Rehabilitation for the exercise Encouraged to attend Cardiac Rehabilitation for the exercise Encouraged to attend Cardiac Rehabilitation for the exercise    Continued Psychosocial Services Needed No No No No       Vocational  Rehabilitation: Provide vocational rehab assistance to qualifying candidates.   Vocational Rehab Evaluation & Intervention:     Vocational Rehab - 11/13/15 1656      Initial Vocational Rehab Evaluation & Intervention   Assessment shows need for Vocational Rehabilitation No      Education: Education Goals: Education classes will be provided on a weekly basis, covering required topics. Participant will state understanding/return demonstration of topics presented.  Learning Barriers/Preferences:     Learning Barriers/Preferences - 11/12/15 1540      Learning Barriers/Preferences   Learning Barriers None   Learning Preferences Audio;Verbal Instruction;Written Material;Group Instruction;Individual Instruction      Education Topics: Count Your Pulse:  -Group instruction provided by verbal instruction, demonstration, patient participation and written materials to support subject.  Instructors address importance of being able to find your pulse and how to count your pulse when at home without a heart monitor.  Patients get hands on experience counting their pulse with staff help and individually.   Heart Attack, Angina, and Risk Factor Modification:  -Group instruction provided by verbal instruction, video, and written materials to support subject.  Instructors address signs and symptoms of angina and heart attacks.    Also discuss risk factors for heart disease and how to make changes to improve heart health risk factors.   Functional Fitness:  -Group instruction provided by verbal instruction, demonstration, patient participation, and written materials to support subject.  Instructors address safety measures for doing things around the house.  Discuss how to get up and down off the floor, how to pick things up properly, how to safely get out of a chair without assistance, and balance training.   Meditation and Mindfulness:  -Group instruction provided by verbal instruction, patient  participation, and written materials to support subject.  Instructor addresses importance of mindfulness and meditation practice to help reduce stress and improve awareness.  Instructor also leads participants through a meditation exercise.    Stretching for Flexibility and Mobility:  -Group instruction provided by verbal instruction, patient participation, and written materials to support subject.  Instructors lead participants through series of stretches that are designed to increase flexibility thus improving mobility.  These stretches are additional exercise for major muscle groups that are typically performed during regular warm up and cool down.   Hands Only CPR Anytime:  -Group instruction provided by verbal instruction, video, patient participation and written materials to support subject.  Instructors co-teach with AHA video for hands only CPR.  Participants get hands on experience with mannequins.   Nutrition I class: Heart Healthy Eating:  -Group instruction provided by PowerPoint slides, verbal discussion, and written materials to support subject matter. The instructor gives an explanation and review of the Therapeutic Lifestyle Changes diet recommendations, which includes a discussion on lipid goals, dietary fat, sodium, fiber, plant stanol/sterol esters, sugar, and the components of a well-balanced, healthy  diet. Flowsheet Row CARDIAC REHAB PHASE II EXERCISE from 12/04/2015 in [place]  Date  12/04/15  Educator  RD  Instruction Review Code  Not applicable [class handout given]      Nutrition II class: Lifestyle Skills:  -Group instruction provided by PowerPoint slides, verbal discussion, and written materials to support subject matter. The instructor gives an explanation and review of label reading, grocery shopping for heart health, heart healthy recipe modifications, and ways to make healthier choices when eating out. Flowsheet Row CARDIAC REHAB  PHASE II EXERCISE from 12/04/2015 in [place]  Date  12/04/15  Educator  RD  Instruction Review Code  Not applicable [class handouts given]      Diabetes Question & Answer:  -Group instruction provided by PowerPoint slides, verbal discussion, and written materials to support subject matter. The instructor gives an explanation and review of diabetes co-morbidities, pre- and post-prandial blood glucose goals, pre-exercise blood glucose goals, signs, symptoms, and treatment of hypoglycemia and hyperglycemia, and foot care basics.   Diabetes Blitz:  -Group instruction provided by PowerPoint slides, verbal discussion, and written materials to support subject matter. The instructor gives an explanation and review of the physiology behind type 1 and type 2 diabetes, diabetes medications and rational behind using different medications, pre- and post-prandial blood glucose recommendations and Hemoglobin A1c goals, diabetes diet, and exercise including blood glucose guidelines for exercising safely.    Portion Distortion:  -Group instruction provided by PowerPoint slides, verbal discussion, written materials, and food models to support subject matter. The instructor gives an explanation of serving size versus portion size, changes in portions sizes over the last 20 years, and what consists of a serving from each food group.   Stress Management:  -Group instruction provided by verbal instruction, video, and written materials to support subject matter.  Instructors review role of stress in heart disease and how to cope with stress positively.     Exercising on Your Own:  -Group instruction provided by verbal instruction, power point, and written materials to support subject.  Instructors discuss benefits of exercise, components of exercise, frequency and intensity of exercise, and end points for exercise.  Also discuss use of nitroglycerin and activating EMS.  Review  options of places to exercise outside of rehab.  Review guidelines for sex with heart disease.   Cardiac Drugs I:  -Group instruction provided by verbal instruction and written materials to support subject.  Instructor reviews cardiac drug classes: antiplatelets, anticoagulants, beta blockers, and statins.  Instructor discusses reasons, side effects, and lifestyle considerations for each drug class.   Cardiac Drugs II:  -Group instruction provided by verbal instruction and written materials to support subject.  Instructor reviews cardiac drug classes: angiotensin converting enzyme inhibitors (ACE-I), angiotensin II receptor blockers (ARBs), nitrates, and calcium channel blockers.  Instructor discusses reasons, side effects, and lifestyle considerations for each drug class.   Anatomy and Physiology of the Circulatory System:  -Group instruction provided by verbal instruction, video, and written materials to support subject.  Reviews functional anatomy of heart, how it relates to various diagnoses, and what role the heart plays in the overall system.   Knowledge Questionnaire Score:     Knowledge Questionnaire Score - 11/12/15 1541      Knowledge Questionnaire Score   Pre Score 22/24      Core Components/Risk Factors/Patient Goals at Admission:     Personal Goals and Risk Factors at Admission - 11/12/15 1543  Core Components/Risk Factors/Patient Goals on Admission   Admit Weight 253 lb 4.9 oz (114.9 kg)   Goal Weight: Short Term 247 lb (112 kg)   Hypertension Yes   Intervention Provide education on lifestyle modifcations including regular physical activity/exercise, weight management, moderate sodium restriction and increased consumption of fresh fruit, vegetables, and low fat dairy, alcohol moderation, and smoking cessation.;Monitor prescription use compliance.   Expected Outcomes Short Term: Continued assessment and intervention until BP is < 140/75m HG in hypertensive  participants. < 130/872mHG in hypertensive participants with diabetes, heart failure or chronic kidney disease.;Long Term: Maintenance of blood pressure at goal levels.   Lipids Yes   Intervention Provide education and support for participant on nutrition & aerobic/resistive exercise along with prescribed medications to achieve LDL <7032mHDL >55m98m Expected Outcomes Short Term: Participant states understanding of desired cholesterol values and is compliant with medications prescribed. Participant is following exercise prescription and nutrition guidelines.;Long Term: Cholesterol controlled with medications as prescribed, with individualized exercise RX and with personalized nutrition plan. Value goals: LDL < 70mg88mL > 40 mg.      Core Components/Risk Factors/Patient Goals Review:      Goals and Risk Factor Review    Row Name 12/04/15 1601 12/23/15 0748 01/15/16 1025 01/22/16 1741 02/21/16 0733     Core Components/Risk Factors/Patient Goals Review   Personal Goals Review Weight Management/Obesity Weight Management/Obesity;Other Weight Management/Obesity;Other Weight Management/Obesity;Other;Hypertension;Increase Strength and Stamina Weight Management/Obesity   Review see 12/04/15 RD note; wt today down 2.5 kg Pt is almost at short term goal weight. Current weight is 247.7 lbs. Pt has returned to outdoor cycling ~5 miles, 30-35 minutes 2x's/week. Current weight is 249.9. Making progress when at cardiac rehab.  Pt has lost 3.3 lbs since admission. Pt has been out due to work schedule but states he's walking at home. Blood pressure better at rest and with exercise. Patient has lost 5.1 lbs since admission. Doing well with exercise.   Expected Outcomes slow wt loss of 0.5-2 lb/week Continue exercise program 30-35 minutes, 5-7 days per week to maintain heart health and weight loss. Continue exercise program 30-35 minutes, 5-7 days per week to maintain heart health and weight loss. Continue exercise  program 30-35 minutes, 5-7 days per week to maintain heart health and weight loss. Continue exercise program 30-35 minutes, 5-7 days per week to maintain heart health and weight loss.      Core Components/Risk Factors/Patient Goals at Discharge (Final Review):      Goals and Risk Factor Review - 02/21/16 0733      Core Components/Risk Factors/Patient Goals Review   Personal Goals Review Weight Management/Obesity   Review Patient has lost 5.1 lbs since admission. Doing well with exercise.   Expected Outcomes Continue exercise program 30-35 minutes, 5-7 days per week to maintain heart health and weight loss.      ITP Comments:     ITP Comments    Row Name 11/12/15 1355 02/07/16 1538         ITP Comments Dr. TraciFransico Himical Director  attended Hypertension video/lecture education offering/met outcome, goals         Comments: HowarLeonidasaking expected progress toward personal goals after completing 22 sessions. Recommend continued exercise and life style modification education including  stress management and relaxation techniques to decrease cardiac risk profile. HowarHiepinues to do a great job with exercise and weight loss.MariaBarnet PallBSN 02/21/2016 9:10 AM

## 2016-02-19 ENCOUNTER — Encounter (HOSPITAL_COMMUNITY): Admission: RE | Admit: 2016-02-19 | Payer: 59 | Source: Ambulatory Visit

## 2016-02-21 ENCOUNTER — Encounter (HOSPITAL_COMMUNITY): Payer: 59

## 2016-02-24 ENCOUNTER — Encounter (HOSPITAL_COMMUNITY): Payer: 59

## 2016-02-26 ENCOUNTER — Other Ambulatory Visit: Payer: Self-pay | Admitting: Sports Medicine

## 2016-02-26 ENCOUNTER — Encounter (HOSPITAL_COMMUNITY)
Admission: RE | Admit: 2016-02-26 | Discharge: 2016-02-26 | Disposition: A | Discharge status: Home / Self Care | Payer: 59 | Source: Ambulatory Visit | Attending: Cardiology | Admitting: Cardiology

## 2016-02-26 DIAGNOSIS — I214 Non-ST elevation (NSTEMI) myocardial infarction: Secondary | ICD-10-CM

## 2016-02-26 DIAGNOSIS — M545 Low back pain: Secondary | ICD-10-CM

## 2016-02-26 DIAGNOSIS — Z955 Presence of coronary angioplasty implant and graft: Secondary | ICD-10-CM

## 2016-02-28 ENCOUNTER — Encounter (HOSPITAL_COMMUNITY)
Admission: RE | Admit: 2016-02-28 | Discharge: 2016-02-28 | Disposition: A | Discharge status: Home / Self Care | Payer: 59 | Source: Ambulatory Visit | Attending: Cardiology | Admitting: Cardiology

## 2016-02-28 DIAGNOSIS — I214 Non-ST elevation (NSTEMI) myocardial infarction: Secondary | ICD-10-CM

## 2016-02-28 DIAGNOSIS — Z955 Presence of coronary angioplasty implant and graft: Secondary | ICD-10-CM

## 2016-03-02 ENCOUNTER — Telehealth: Payer: Self-pay | Admitting: Cardiology

## 2016-03-02 ENCOUNTER — Encounter (HOSPITAL_COMMUNITY): Payer: 59

## 2016-03-02 NOTE — Telephone Encounter (Signed)
New Message  Pt c/o medication issue:  1. Name of Medication: Valium  2. How are you currently taking this medication (dosage and times per day)? Per pt does not know   3. Are you having a reaction (difficulty breathing--STAT)? no  4. What is your medication issue? Pt call requesting to speak with RN. Pt stats he was prescribed valium and wants to know will it call reaction with other meds he is currently taking. Please call back to discuss

## 2016-03-02 NOTE — Telephone Encounter (Addendum)
Informed patient Valium is OK to take with his other medications. It is a OTO for an MRI to look at his spine and he does know to have someone drive him to his appointment. He understands if any further cardiac clearance is needed to have his MD send a [facility] to [facility]. He was grateful for assistance.

## 2016-03-02 NOTE — Telephone Encounter (Signed)
Ok to take valium with his other medications.  Make sure patient is aware not to drive until he knows how he will be affected by the medications.

## 2016-03-04 ENCOUNTER — Encounter (HOSPITAL_COMMUNITY): Payer: 59

## 2016-03-05 ENCOUNTER — Ambulatory Visit
Admission: RE | Admit: 2016-03-05 | Discharge: 2016-03-05 | Disposition: A | Discharge status: Home / Self Care | Payer: 59 | Source: Ambulatory Visit | Attending: Sports Medicine | Admitting: Sports Medicine

## 2016-03-05 DIAGNOSIS — M545 Low back pain: Secondary | ICD-10-CM

## 2016-03-06 ENCOUNTER — Encounter (HOSPITAL_COMMUNITY): Payer: 59

## 2016-03-06 ENCOUNTER — Other Ambulatory Visit: Payer: 59

## 2016-03-09 ENCOUNTER — Encounter (HOSPITAL_COMMUNITY): Payer: 59

## 2016-03-11 ENCOUNTER — Encounter (HOSPITAL_COMMUNITY): Payer: 59

## 2016-03-13 ENCOUNTER — Encounter (HOSPITAL_COMMUNITY): Payer: 59

## 2016-03-16 ENCOUNTER — Encounter (HOSPITAL_COMMUNITY)
Admission: RE | Admit: 2016-03-16 | Discharge: 2016-03-16 | Disposition: A | Discharge status: Home / Self Care | Payer: 59 | Source: Ambulatory Visit | Attending: Cardiology | Admitting: Cardiology

## 2016-03-16 VITALS — BP 134/80 | HR 83 | Ht 71.25 in | Wt 253.5 lb

## 2016-03-16 DIAGNOSIS — I214 Non-ST elevation (NSTEMI) myocardial infarction: Secondary | ICD-10-CM | POA: Diagnosis present

## 2016-03-16 DIAGNOSIS — Z79899 Other long term (current) drug therapy: Secondary | ICD-10-CM | POA: Diagnosis not present

## 2016-03-16 DIAGNOSIS — Z955 Presence of coronary angioplasty implant and graft: Secondary | ICD-10-CM | POA: Insufficient documentation

## 2016-03-16 DIAGNOSIS — E7800 Pure hypercholesterolemia, unspecified: Secondary | ICD-10-CM | POA: Diagnosis not present

## 2016-03-16 DIAGNOSIS — E785 Hyperlipidemia, unspecified: Secondary | ICD-10-CM | POA: Diagnosis not present

## 2016-03-16 DIAGNOSIS — I10 Essential (primary) hypertension: Secondary | ICD-10-CM | POA: Diagnosis not present

## 2016-03-16 NOTE — Progress Notes (Signed)
Discharge Summary  Patient Details  Name: Raymond Jordan MRN: [id number] Date of Birth: January 24, 1961 Referring Provider:   Flowsheet Row CARDIAC REHAB PHASE II ORIENTATION from 11/12/2015 in [facility]  Referring Provider  Fransico Him MD       Number of Visits: 25  Reason for Discharge:  Patient independent in their exercise.  Smoking History:  History  Smoking Status  . Former Smoker  Smokeless Tobacco  . Never Used    Comment: "smoked 2 packs of cigaretes in my whole life"    Diagnosis:  NSTEMI (non-ST elevated myocardial infarction) ([place])  Stented coronary artery  ADL UCSD:   Initial Exercise Prescription:     Initial Exercise Prescription - 11/12/15 1500      Date of Initial Exercise RX and Referring Provider   Date 11/12/15   Referring Provider Fransico Him MD     Treadmill   MPH 3   Grade 3   Minutes 10   METs 4.54     Bike   Level 2.3   Minutes 10   METs 4.77     NuStep   Level 3   Minutes 10   METs 3     Prescription Details   Frequency (times per week) 3   Duration Progress to 30 minutes of continuous aerobic without signs/symptoms of physical distress     Intensity   THRR 40-80% of Max Heartrate 84-134   Ratings of Perceived Exertion 11-13   Perceived Dyspnea 0-4     Progression   Progression Continue to progress workloads to maintain intensity without signs/symptoms of physical distress.     Resistance Training   Training Prescription Yes   Weight 3 lbs.   Reps 10-12      Discharge Exercise Prescription (Final Exercise Prescription Changes):     Exercise Prescription Changes - 03/20/16 0800      Exercise Review   Progression Yes     Response to Exercise   Blood Pressure (Admit) 134/80   Blood Pressure (Exercise) 150/82   Blood Pressure (Exit) 118/84   Heart Rate (Admit) 83 bpm   Heart Rate (Exercise) 147 bpm   Heart Rate (Exit) 83 bpm   Perceived Dyspnea (Exercise) 12   Symptoms  none   Comments Home exercise given 11/27/15.   Duration Progress to 30 minutes of continuous aerobic without signs/symptoms of physical distress   Intensity THRR unchanged     Progression   Progression Continue to progress workloads to maintain intensity without signs/symptoms of physical distress.   Average METs 5.9     Resistance Training   Training Prescription Yes   Weight 8 lbs   Reps 10-12     Interval Training   Interval Training No     Treadmill   MPH 3.7   Grade 5   Minutes 10   METs 6.38     NuStep   Level 5   Minutes 10   METs 5     Rower   Level 4   Watts 94   Minutes 10   METs 6.3     Home Exercise Plan   Plans to continue exercise at Home   Frequency Add 4 additional days to program exercise sessions.      Functional Capacity:     6 Minute Walk    Row Name 11/12/15 1531 03/16/16 1517       6 Minute Walk   Phase Initial Discharge    Distance 1913  feet 1875 feet    Distance % Change  - -1.99 %    Walk Time 6 minutes 6 minutes    # of Rest Breaks 0 0    MPH 3.62 3.55    METS 4.83 5.12    RPE 11 8    VO2 Peak 16.91 17.92    Symptoms No No    Resting HR 72 bpm 83 bpm    Resting BP 126/82 134/80    Max Ex. HR 115 bpm 147 bpm    Max Ex. BP 150/80 150/82    2 Minute Post BP 120/76  -       Psychological, QOL, Others - Outcomes: PHQ 2/9: Depression screen [facility] 2/9 03/16/2016 01/11/2016 11/20/2015 01/11/2015  Decreased Interest 0 0 0 0  Down, Depressed, Hopeless 0 0 0 0  PHQ - 2 Score 0 0 0 0    Quality of Life:     Quality of Life - 03/16/16 1600      Quality of Life Scores   Health/Function Pre 22.4 %   Health/Function Post 27.2 %   Health/Function % Change 21.43 %   Socioeconomic Pre 24 %   Socioeconomic Post 29.14 %   Socioeconomic % Change  21.42 %   Psych/Spiritual Pre 24 %   Psych/Spiritual Post 26 %   Psych/Spiritual % Change 8.33 %   Family Pre 27.6 %   Family Post 28.8 %   Family % Change 4.35 %   GLOBAL Pre 23.83 %    GLOBAL Post 27.64 %   GLOBAL % Change 15.99 %      Personal Goals: Goals established at orientation with interventions provided to work toward goal.     Personal Goals and Risk Factors at Admission - 11/12/15 1543      Core Components/Risk Factors/Patient Goals on Admission   Admit Weight 253 lb 4.9 oz (114.9 kg)   Goal Weight: Short Term 247 lb (112 kg)   Hypertension Yes   Intervention Provide education on lifestyle modifcations including regular physical activity/exercise, weight management, moderate sodium restriction and increased consumption of fresh fruit, vegetables, and low fat dairy, alcohol moderation, and smoking cessation.;Monitor prescription use compliance.   Expected Outcomes Short Term: Continued assessment and intervention until BP is < 140/19m HG in hypertensive participants. < 130/815mHG in hypertensive participants with diabetes, heart failure or chronic kidney disease.;Long Term: Maintenance of blood pressure at goal levels.   Lipids Yes   Intervention Provide education and support for participant on nutrition & aerobic/resistive exercise along with prescribed medications to achieve LDL <7068mHDL >49m48m Expected Outcomes Short Term: Participant states understanding of desired cholesterol values and is compliant with medications prescribed. Participant is following exercise prescription and nutrition guidelines.;Long Term: Cholesterol controlled with medications as prescribed, with individualized exercise RX and with personalized nutrition plan. Value goals: LDL < 70mg72mL > 40 mg.       Personal Goals Discharge:     Goals and Risk Factor Review    Row Name 12/04/15 1601 12/23/15 0748 01/15/16 1025 01/22/16 1741 02/21/16 0733     Core Components/Risk Factors/Patient Goals Review   Personal Goals Review Weight Management/Obesity Weight Management/Obesity;Other Weight Management/Obesity;Other Weight Management/Obesity;Other;Hypertension;Increase Strength and  Stamina Weight Management/Obesity   Review see 12/04/15 RD note; wt today down 2.5 kg Pt is almost at short term goal weight. Current weight is 247.7 lbs. Pt has returned to outdoor cycling ~5 miles, 30-35 minutes 2x's/week. Current weight is 249.9. Making progress  when at cardiac rehab.  Pt has lost 3.3 lbs since admission. Pt has been out due to work schedule but states he's walking at home. Blood pressure better at rest and with exercise. Patient has lost 5.1 lbs since admission. Doing well with exercise.   Expected Outcomes slow wt loss of 0.5-2 lb/week Continue exercise program 30-35 minutes, 5-7 days per week to maintain heart health and weight loss. Continue exercise program 30-35 minutes, 5-7 days per week to maintain heart health and weight loss. Continue exercise program 30-35 minutes, 5-7 days per week to maintain heart health and weight loss. Continue exercise program 30-35 minutes, 5-7 days per week to maintain heart health and weight loss.   [place] Name 03/20/16 1194             Core Components/Risk Factors/Patient Goals Review   Personal Goals Review Weight Management/Obesity;Increase Strength and Stamina;Other       Review Patient made excellent progress toward reaching his personal goals. Pt had lost as much as 5.1lbs during the program but was out the last couple of weeks due to work and also just returned from vacation, so weight was back to starting weight. Pt is back to cycling and strength improved as evidenced by improved grip strength measure. Pt plans to join a fitness center in addition to walking and cycling at home.       Expected Outcomes Continue exercise program 30-35 minutes, 5-7 days per week to maintain heart health and weight loss.          Nutrition & Weight - Outcomes:     Pre Biometrics - 11/12/15 1532      Pre Biometrics   Height 5' 11.25" (1.81 m)   Weight 253 lb 4.9 oz (114.9 kg)   Waist Circumference 40.25 inches   Hip Circumference 41.25 inches   Waist  to Hip Ratio 0.98 %   BMI (Calculated) 35.2   Triceps Skinfold 27 mm   % Body Fat 31.8 %   Grip Strength 59.5 kg   Flexibility 9.5 in   Single Leg Stand 30 seconds         Post Biometrics - 03/16/16 1600       Post  Biometrics   Height 5' 11.25" (1.81 m)   Weight 253 lb 8.5 oz (115 kg)   Waist Circumference 41.75 inches   Hip Circumference 44 inches   Waist to Hip Ratio 0.95 %   BMI (Calculated) 35.2   Triceps Skinfold 24 mm   % Body Fat 32.1 %   Grip Strength 65 kg   Flexibility 13 in   Single Leg Stand 27.4 seconds      Nutrition:     Nutrition Therapy & Goals - 01/29/16 1550      Nutrition Therapy   Diet Therapeutic Lifestyle Changes     Personal Nutrition Goals   Personal Goal #1 0.5-2 lb wt loss per week to a goal wt loss of 6-24 lb at graduation from [place], educate and counsel regarding individualized specific dietary modifications aiming towards targeted core components such as weight, hypertension, lipid management, diabetes, heart failure and other comorbidities.;Nutrition handout(s) given to patient.  Handouts for High Fiber Nutrition Therapy and Fiber Content of Food given   Expected Outcomes Short Term Goal: Understand basic principles of dietary content, such as calories, fat, sodium, cholesterol and nutrients.;Long Term Goal: Adherence to prescribed nutrition plan.      Nutrition  Discharge:     Nutrition Assessments - 03/23/16 0958      MEDFICTS Scores   Pre Score 18   Post Score 0   Score Difference -18      Education Questionnaire Score:     Knowledge Questionnaire Score - 03/16/16 1600      Knowledge Questionnaire Score   Pre Score 22/24   Post Score 22/24      Goals reviewed with patient; copy given to patient.Pt graduated from cardiac rehab program today with completion of 25 exercise sessions in Phase II. Pt maintained good attendance and progressed nicely during his  participation in rehab as evidenced by increased MET level.   Medication list reconciled. Repeat  PHQ score- 0 .  Pt has made significant lifestyle changes and should be commended for his success. Pt feels he has achieved his goals during cardiac rehab.   Pt plans to continue exercise at [employer]. Raymond Jordan did have some conflicts with coming to class due to his busy work schedule. We are proud of Raymond Jordan's progress!Barnet Pall, RN,BSN 04/07/2016 10:04 AM

## 2016-03-18 ENCOUNTER — Encounter (HOSPITAL_COMMUNITY): Payer: 59

## 2016-03-20 ENCOUNTER — Encounter (HOSPITAL_COMMUNITY): Payer: 59

## 2016-05-04 ENCOUNTER — Telehealth: Payer: Self-pay | Admitting: *Deleted

## 2016-05-04 ENCOUNTER — Other Ambulatory Visit: Payer: 59 | Admitting: *Deleted

## 2016-05-04 DIAGNOSIS — E7800 Pure hypercholesterolemia, unspecified: Secondary | ICD-10-CM

## 2016-05-04 LAB — LIPID PANEL
CHOL/HDL RATIO: 2.7 ratio (ref <=5.0)
Cholesterol: 121 mg/dL — ABNORMAL LOW (ref 125–200)
HDL: 45 mg/dL (ref >=40)
LDL CALC: 62 mg/dL (ref <130)
Triglycerides: 70 mg/dL (ref <150)
VLDL: 14 mg/dL (ref <30)

## 2016-05-04 LAB — HEPATIC FUNCTION PANEL
ALBUMIN: 4 g/dL (ref 3.6–5.1)
ALT: 16 U/L (ref 9–46)
AST: 21 U/L (ref 10–35)
Alkaline Phosphatase: 60 U/L (ref 40–115)
BILIRUBIN TOTAL: 0.3 mg/dL (ref 0.2–1.2)
Bilirubin, Direct: 0.1 mg/dL (ref <=0.2)
Indirect Bilirubin: 0.2 mg/dL (ref 0.2–1.2)
Total Protein: 6.7 g/dL (ref 6.1–8.1)

## 2016-05-04 NOTE — Telephone Encounter (Signed)
Pt notified of lab results by phone with verbal understanding.  

## 2016-05-08 ENCOUNTER — Other Ambulatory Visit: Payer: 59

## 2016-06-12 ENCOUNTER — Encounter: Payer: Self-pay | Admitting: *Deleted

## 2016-06-12 ENCOUNTER — Encounter: Payer: Self-pay | Admitting: Cardiology

## 2016-06-12 ENCOUNTER — Ambulatory Visit (INDEPENDENT_AMBULATORY_CARE_PROVIDER_SITE_OTHER): Payer: 59 | Admitting: Cardiology

## 2016-06-12 VITALS — BP 124/80 | HR 60 | Ht 71.5 in | Wt 255.0 lb

## 2016-06-12 DIAGNOSIS — Z006 Encounter for examination for normal comparison and control in clinical research program: Secondary | ICD-10-CM

## 2016-06-12 DIAGNOSIS — I10 Essential (primary) hypertension: Secondary | ICD-10-CM

## 2016-06-12 DIAGNOSIS — Z9989 Dependence on other enabling machines and devices: Secondary | ICD-10-CM

## 2016-06-12 DIAGNOSIS — I2510 Atherosclerotic heart disease of native coronary artery without angina pectoris: Secondary | ICD-10-CM

## 2016-06-12 DIAGNOSIS — G4733 Obstructive sleep apnea (adult) (pediatric): Secondary | ICD-10-CM

## 2016-06-12 DIAGNOSIS — E7800 Pure hypercholesterolemia, unspecified: Secondary | ICD-10-CM

## 2016-06-12 NOTE — Progress Notes (Signed)
Cardiology Office Note    Date:  06/12/2016   ID:  Raymond HumblesHoward N Huneke, DOB [birth year], MRN [id number]  PCP:  Hollice EspyGATES,DONNA RUTH, MD  Cardiologist:  Armanda Magicraci Turner, MD   Chief Complaint  Patient presents with  . Coronary Artery Disease  . Hypertension  . Hyperlipidemia    History of Present Illness:  Raymond Jordan is a 55 y.o. male with a history of HTN, OSA, obesity, ASCAD s/p non-STEMI with cath showing 99% prox RCA s/p DES (now on DAPT) with normal LVF who presents today for followup. He is doing well with his CPAP therapy. He tolerates his nasal pillow mask and feels the pressure is adequate. He sleeps well at night. He has no daytime sleepiness and feels rested when he gets up in the am.He occasionally naps during the day.   He does not snore if he uses his CPAP. He denies any nasal congestion or mouth dryness when using the CPAP.  He denies any anginal chest pain, SOB, DOE, LE edema, dizziness, palpitations or syncope.  He denies any claudication.     Past Medical History:  Diagnosis Date  . Allergic rhinitis    /asthma  . Arthritis    "shoulders, back, knees" (09/16/2015)  . CAD (coronary artery disease), native coronary artery 09/15/2015   s/p NSTEMI 09/2015 with cath showing 99% RCA s/p DES now on DAPT  . Childhood asthma   . ED (erectile dysfunction)   . Hypercholesteremia   . Hyperlipidemia LDL goal <70   . Hypertension   . 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 Obstructive sleep apnea on CPAP   . Osteoarthritis    OA knees, shoulders; DDD lumbar spine  . Seasonal allergies     Past Surgical History:  Procedure Laterality Date  . ANKLE SURGERY  1985   bone spur removal ankle.  Marland Kitchen. CARDIAC CATHETERIZATION N/A 09/16/2015   Procedure: Left Heart Cath and Coronary Angiography;  Surgeon: Kathleene Hazelhristopher D McAlhany, MD;  [facility];  Service: Cardiovascular;  Laterality: N/A;  . CARDIAC CATHETERIZATION N/A 09/16/2015   Procedure: Coronary Stent Intervention;   Surgeon: Kathleene Hazelhristopher D McAlhany, MD;  Location: [facility] IN[facility]ASIVE CV LAB;  Service: Cardiovascular;  Laterality: N/A;  . MYRINGOTOMY WITH TUBE PLACEMENT Left ~ 07/2009  . NASAL SINUS SURGERY  ~ 2007   "& fixed my septum"  . SHOULDER OPEN ROTATOR CUFF REPAIR Right 10/2013  . VASECTOMY    . WISDOM TOOTH EXTRACTION  ~ 1992    Current Medications: Outpatient Medications Prior to Visit  Medication Sig Dispense Refill  . AMBULATORY NON FORMULARY MEDICATION Take 90 mg by mouth 2 (two) times daily. Medication Name: BRILINTA 90 mg BID (TWILIGHT Research study provided do not fill)    . AMBULATORY NON FORMULARY MEDICATION Take 81 mg by mouth daily. Medication Name: Aspirin 81 mg daily or [facility](TWILGHT research study provided)    . B Complex-Biotin-FA (SUPER B-COMPLEX) TABS Take 1 tablet by mouth daily.    . cetirizine (ZYRTEC) 10 MG tablet Take 10 mg by mouth daily as needed for allergies.     Marland Kitchen. ezetimibe (ZETIA) 10 MG tablet Take 1 tablet (10 mg total) by mouth daily. 90 tablet 3  . fluticasone (FLONASE) 50 MCG/ACT nasal spray Place 2 sprays into both nostrils as needed for allergies or rhinitis.    . metoprolol tartrate (LOPRESSOR) 25 MG tablet Take 1 tablet (25 mg total) by mouth 2 (two) times daily. 28 tablet 0  . nitroGLYCERIN (NITROSTAT) 0.4  MG SL tablet Place 1 tablet (0.4 mg total) under the tongue every 5 (five) minutes as needed for chest pain. 25 tablet 3  . pravastatin (PRAVACHOL) 80 MG tablet Take 1 tablet (80 mg total) by mouth daily. 90 tablet 3  . VENTOLIN HFA 108 (90 Base) MCG/ACT inhaler Inhale 2 puffs into the lungs every 4 (four) hours as needed. Shortness of breath or wheezing  1   No facility-administered medications prior to visit.      Allergies:   Crestor [rosuvastatin] and Lipitor [atorvastatin]   Social History   Social History  . Marital status: Married    Spouse name: N/A  . Number of children: 2  . Years of education: N/A   Occupational History  . Truck driver     Social History Main Topics  . Smoking status: Former Games developermoker  . Smokeless tobacco: Never Used     Comment: "smoked 2 packs of cigaretes in my whole life"  . Alcohol use Yes     Comment: 09/16/2015 "might have a couple drinks q couple months"  . Drug use: No  . Sexual activity: Yes   Other Topics Concern  . None   Social History Narrative   Marital status: Married x 30 years      Children:  2 children; 1 stepchild; grandchild and two step grandchildren.      Employment:  [employer] doctordriver; Medical illustratorDrives locally; self employed; Dietitiancontracted with [employer]      Tobacco:  No tobacco      Alcohol: + weekends sometimes; two drinks per month.      Drugs: none      Exercise:  Walking and cycling.     Family History:  The patient's family history includes Arthritis in his father; Asthma in his mother; Cancer in his father; Diabetes in his mother; Heart attack in his father; Heart disease in his father; Sarcoidosis in his mother; Stroke in his father and mother.   ROS:   Please see the history of present illness.    ROS All other systems reviewed and are negative.  No flowsheet data found.     PHYSICAL EXAM:   VS:  BP 124/80   Pulse 60   Ht 5' 11.5" (1.816 m)   Wt 255 lb (115.7 kg)   BMI 35.07 kg/m    GEN: Well nourished, well developed, in no acute distress  HEENT: normal  Neck: no JVD, carotid bruits, or masses Cardiac: RRR; no murmurs, rubs, or gallops,no edema.  Intact distal pulses bilaterally.  Respiratory:  clear to auscultation bilaterally, normal work of breathing GI: soft, nontender, nondistended, + BS MS: no deformity or atrophy  Skin: warm and dry, no rash Neuro:  Alert and Oriented x 3, Strength and sensation are intact Psych: euthymic mood, full affect  Wt Readings from Last 3 Encounters:  06/12/16 255 lb (115.7 kg)  03/16/16 253 lb 8.5 oz (115 kg)  01/11/16 248 lb 6 oz (112.7 kg)      Studies/Labs Reviewed:   EKG:  EKG is not  ordered today.    Recent Labs: 09/14/2015: TSH 1.156 09/17/2015: BUN 14; Creatinine, Ser 1.06; Hemoglobin 12.0; Platelets 126; Potassium 3.8; Sodium 139 05/04/2016: ALT 16   Lipid Panel    Component Value Date/Time   CHOL 121 (L) 05/04/2016 0736   TRIG 70 05/04/2016 0736   HDL 45 05/04/2016 0736   CHOLHDL 2.7 05/04/2016 0736   VLDL 14 05/04/2016 0736  LDLCALC 62 05/04/2016 0736    Additional studies/ records that were reviewed today include:  CPAP download    ASSESSMENT:    1. Coronary artery disease involving native coronary artery of native heart without angina pectoris   2. Essential hypertension, benign   3. Pure hypercholesterolemia   4. OSA on CPAP      PLAN:  In order of problems listed above:  1.  ASCAD s/p  non-STEMI with cath showing 99% prox RCA s/p DES (now on DAPT) with normal LVF.  He has not had any further CP. Continue BB/ASA/Brilinta and statin.   2.,  HTN - BP controlled on current meds.  Continue BB. 3.  Hyperlipidemia - LDL goal < 70. LDL 62 on labs 04/2016.  Continue statin.  He is complaining of some muscle aches on the Zetia but has a lot of arthritis.  I have instructed him to hold the Zetia for 2 weeks and call me and let me know if things improve.  If so we may need to consider Praulent.   OSA - the patient is tolerating PAP therapy well without any problems. The PAP download was reviewed today and showed an AHI of 0.5/hr on 11 cm H2O with 80% compliance in using more than 4 hours nightly.  The patient has been using and benefiting from CPAP use and will continue to benefit from therapy.      Medication Adjustments/Labs and Tests Ordered: Current medicines are reviewed at length with the patient today.  Concerns regarding medicines are outlined above.  Medication changes, Labs and Tests ordered today are listed in the Patient Instructions below.  There are no Patient Instructions on file for this visit.   Signed, Armanda Magic, MD  06/12/2016  8:28 AM    [facility] [address] [place], [place], [place]  [address] Phone: [phone]; Fax: [phone]

## 2016-06-12 NOTE — Patient Instructions (Signed)

## 2016-06-16 ENCOUNTER — Encounter: Payer: Self-pay | Admitting: Cardiology

## 2016-06-16 NOTE — Progress Notes (Signed)
TWILIGHT Research study month 9 office visit completed. Patient denied any bleeding or other adverse events. After Pill count patient has been 101.8% compliant with ASA/PLACEBO and 99.7 % compliant with Brilinta. Dispensed the following pil bottle number's [id number]; [id number]; [id number]; [id number]. Next research required is due no later than 6/Jul/18. At that visit he will no langer be provided ASA/PLACEBO or Brilinta. Further antiplatelet therapy will be at the discretion of his cardiologist. Questions encouraged and answered.

## 2016-07-17 DIAGNOSIS — H401222 Low-tension glaucoma, left eye, moderate stage: Secondary | ICD-10-CM | POA: Diagnosis not present

## 2016-07-17 DIAGNOSIS — H401211 Low-tension glaucoma, right eye, mild stage: Secondary | ICD-10-CM | POA: Diagnosis not present

## 2016-07-21 ENCOUNTER — Other Ambulatory Visit: Payer: Self-pay | Admitting: Cardiology

## 2016-08-30 ENCOUNTER — Other Ambulatory Visit: Payer: Self-pay | Admitting: Cardiology

## 2016-08-30 DIAGNOSIS — E7800 Pure hypercholesterolemia, unspecified: Secondary | ICD-10-CM

## 2016-09-24 DIAGNOSIS — G4733 Obstructive sleep apnea (adult) (pediatric): Secondary | ICD-10-CM | POA: Diagnosis not present

## 2016-11-12 ENCOUNTER — Encounter: Payer: Self-pay | Admitting: Cardiology

## 2016-11-13 ENCOUNTER — Other Ambulatory Visit: Payer: Self-pay | Admitting: Cardiology

## 2016-11-24 DIAGNOSIS — H401122 Primary open-angle glaucoma, left eye, moderate stage: Secondary | ICD-10-CM | POA: Diagnosis not present

## 2016-11-24 DIAGNOSIS — H401111 Primary open-angle glaucoma, right eye, mild stage: Secondary | ICD-10-CM | POA: Diagnosis not present

## 2016-12-04 ENCOUNTER — Telehealth: Payer: Self-pay

## 2016-12-04 NOTE — Telephone Encounter (Signed)
Left message to call back  

## 2016-12-04 NOTE — Telephone Encounter (Signed)
-----   Message from Quintella Reichertraci R Turner, MD sent at 12/04/2016 11:13 AM EDT ----- Regarding: FW: TWILIGHT Research study end of treatment Starting 12/07/2016 please change patient to ASA 81mg  daily and Plavix 75mg  daily.    Armanda Magicraci Turner, MD ----- Message ----- From: Henrietta DineKemp, Ingeborg Fite A, RN Sent: 12/02/2016   1:54 PM To: Quintella Reichertraci R Turner, MD Subject: Annell GreeningFWDaryl Eastern: TWILIGHT Research study end of treatment   Please see below. Thanks! ----- Message ----- From: Orrin BrighamHedrick, Tammy W, RN Sent: 11/23/2016   8:09 AM To: Quintella Reichertraci R Turner, MD, Henrietta DineKathryn A Valinda Fedie, RN Subject: TWILIGHT Research study end of treatment       Dr. Mayford Knifeurner,       Mr. Esmeralda ArthurCrosby has a research appointment 12/07/16. At this appointment he will no longer receive Brilinta or ASA/Placebo.   Further antiplatelet therapy (including ASA) will be at your discretion. A script will need to be sent to his pharmacy.  Thank you, Amelia Joammy Hedrick

## 2016-12-07 ENCOUNTER — Ambulatory Visit (INDEPENDENT_AMBULATORY_CARE_PROVIDER_SITE_OTHER): Payer: 59 | Admitting: Cardiology

## 2016-12-07 ENCOUNTER — Encounter: Payer: Self-pay | Admitting: *Deleted

## 2016-12-07 ENCOUNTER — Encounter: Payer: Self-pay | Admitting: Cardiology

## 2016-12-07 ENCOUNTER — Other Ambulatory Visit: Payer: Self-pay | Admitting: *Deleted

## 2016-12-07 VITALS — BP 140/84 | HR 62 | Ht 71.5 in | Wt 264.8 lb

## 2016-12-07 DIAGNOSIS — E7800 Pure hypercholesterolemia, unspecified: Secondary | ICD-10-CM | POA: Diagnosis not present

## 2016-12-07 DIAGNOSIS — Z006 Encounter for examination for normal comparison and control in clinical research program: Secondary | ICD-10-CM

## 2016-12-07 DIAGNOSIS — I2510 Atherosclerotic heart disease of native coronary artery without angina pectoris: Secondary | ICD-10-CM | POA: Diagnosis not present

## 2016-12-07 DIAGNOSIS — I10 Essential (primary) hypertension: Secondary | ICD-10-CM

## 2016-12-07 MED ORDER — CLOPIDOGREL BISULFATE 75 MG PO TABS: 75.0000 mg | ORAL_TABLET | Freq: Every day | ORAL | 0 refills | Status: DC

## 2016-12-07 MED ORDER — ASPIRIN EC 81 MG PO TBEC: 81.0000 mg | DELAYED_RELEASE_TABLET | Freq: Every day | ORAL | 3 refills | Status: DC

## 2016-12-07 MED ORDER — EZETIMIBE 10 MG PO TABS: 10.0000 mg | ORAL_TABLET | Freq: Every day | ORAL | 3 refills | Status: DC

## 2016-12-07 MED ORDER — CLOPIDOGREL BISULFATE 75 MG PO TABS: 75.0000 mg | ORAL_TABLET | Freq: Every day | ORAL | 3 refills | Status: DC

## 2016-12-07 NOTE — Progress Notes (Signed)
TWILIGHT Research study month 15 follow up visit completed. Patient denies any bleeding or other adverse events. Patient has been 99% compliant with both ASA/Placebo and Brilinta. He has been instructed to start Plavix 75 mg and ASA 81 mg post TWILIGHT study. I have given him a few Brilinta until he can get his script filled. He understands to NOT take both Brilinta and Plavix. He has 1 remaining telephone follow up visit due before 18/SEP/2018. Questions encouraged and answered.

## 2016-12-07 NOTE — Telephone Encounter (Signed)
Follow up   Pt calling again 937AM

## 2016-12-07 NOTE — Telephone Encounter (Signed)
Instructed patient to change to ASA 81 mg daily and Plavix 75 mg daily. He agrees with treatment plan.  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 Norris Crossurner's notes/recommendations. He will call his PCP to find out if further cardiac testing is necessary for DOT clearance. Download pulled and placed to be scanned in.

## 2016-12-07 NOTE — Patient Instructions (Addendum)
Medication Instructions:  Your physician has recommended you make the following change in your medication:  1.  RESTART  Zetia  WHEN YOU TAKE YOUR LAST BRILINTA, YOU WILL START THE PLAVIX AND ASPIRIN THE NEXT DAY TAKING 1 TABLET EACH DAILY  Labwork: WITHIN A WEEK:  FASTING LIPID & HEPATIC   Testing/Procedures: None ordered  Follow-Up: Your physician wants you to follow-up in: 6 MONTHS WITH DR. Sherlyn LickURNER  You will receive a reminder letter in the mail two months in advance. If you don't receive a letter, please call our office to schedule the follow-up appointment.  Any Other Special Instructions Will Be Listed Below (If Applicable).   Your physician has requested that you regularly monitor and record your blood pressure readings at home. Please use the same machine at the same time of day to check your readings and record them to bring to your follow-up visit.    If you need a refill on your cardiac medications before your next appointment, please call your pharmacy.

## 2016-12-07 NOTE — Progress Notes (Signed)
12/07/2016 Raymond HumblesHoward N Shuffield   04-17-1961  [id number]  Primary Physician Shaune PollackGates, Donna, MD Primary Cardiologist: Dr. Mayford Knifeurner    Reason for Visit/CC: 6 Month f/u for CAD  HPI:  Raymond HumblesHoward N Jordan is a 56 y.o. male , followed by Dr. Mayford Knifeurner, who presents to clinic today for routine f/u. He has a h/o CAD. In 09/2015, he was admitted for NSTEMI with cath showing single vessel CAD with severe ulcerated thrombotic stenosis involving the RCA. This was treated with PTCA +DES. LVEF was normal. He also has ah/o obesity, HTN, and OSA on CPAP therapy. His cholesterol is well controlled with pravastatin. His last lipid panel 05/04/17 showed LDL at 62 mg/d/L.   He is doing well. No recurrent CP or dyspnea. He was a participant in the TWILIGHT study with ASA and Brilinta, but recently finished. He was instructed by the research team today to Stop Brilinta to to start ASA 81 mg and Plavix 75 mg. He reports full compliance with pravastatin but has been off of Zetia due to muscle aches. His BP is mildly elevated at 140/84. HR is 62 bpm. EKG shows NSR. No ischemia. He admits that he just ate lunch at [employer], which consisted of a fried chicken sandwich, fries and a tea.   He reports full compliance with CPAP. Tolerating well. No daytime fatigue.     No outpatient prescriptions have been marked as taking for the 12/07/16 encounter (Office Visit) with Allayne ButcherSimmons, Hoa Briggs M, PA-C.   Allergies  Allergen Reactions  . Crestor [Rosuvastatin]     Muscle aches & cough  . Lipitor [Atorvastatin]     Muscle aches & cough   Past Medical History:  Diagnosis Date  . Allergic rhinitis    /asthma  . Arthritis    "shoulders, back, knees" (09/16/2015)  . CAD (coronary artery disease), native coronary artery 09/15/2015   s/p NSTEMI 09/2015 with cath showing 99% RCA s/p DES now on DAPT  . Childhood asthma   . ED (erectile dysfunction)   . Hypercholesteremia   . Hyperlipidemia LDL goal <70   . Hypertension   . Meningitis  hospitalized 07/2009  . Obstructive sleep apnea on CPAP   . Osteoarthritis    OA knees, shoulders; DDD lumbar spine  . Seasonal allergies    Family History  Problem Relation Age of Onset  . Asthma Mother        sarcoidosis also  . Diabetes Mother   . Stroke Mother   . Sarcoidosis Mother   . Stroke Father   . Cancer Father        R breast cancer  . Heart disease Father        AMI/CAD with stenting  . Arthritis Father   . Heart attack Father    Past Surgical History:  Procedure Laterality Date  . ANKLE SURGERY  1985   bone spur removal ankle.  Marland Kitchen. CARDIAC CATHETERIZATION N/A 09/16/2015   Procedure: Left Heart Cath and Coronary Angiography;  Surgeon: Kathleene Hazelhristopher D McAlhany, MD;  [facility];  Service: Cardiovascular;  Laterality: N/A;  . CARDIAC CATHETERIZATION N/A 09/16/2015   Procedure: Coronary Stent Intervention;  Surgeon: Kathleene Hazelhristopher D McAlhany, MD;  [facility]: [facility];  Service: Cardiovascular;  Laterality: N/A;  . MYRINGOTOMY WITH TUBE PLACEMENT Left ~ 07/2009  . NASAL SINUS SURGERY  ~ 2007   "& fixed my septum"  . SHOULDER OPEN ROTATOR CUFF REPAIR Right 10/2013  . VASECTOMY    . WISDOM TOOTH EXTRACTION  ~  62   Social History   Social History  . Marital status: Married    Spouse name: N/A  . Number of children: 2  . Years of education: N/A   Occupational History  . Truck driver    Social History Main Topics  . Smoking status: Former Games developer  . Smokeless tobacco: Never Used     Comment: "smoked 2 packs of cigaretes in my whole life"  . Alcohol use Yes     Comment: 09/16/2015 "might have a couple drinks q couple months"  . Drug use: No  . Sexual activity: Yes   Other Topics Concern  . Not on file   Social History Narrative   Marital status: Married x 30 years      Children:  2 children; 1 stepchild; grandchild and two step grandchildren.      Employment:  Truck Hospital doctor; Medical illustrator; self employed; Agricultural engineer group; drives in [place] and [place].      Tobacco:  No tobacco      Alcohol: + weekends sometimes; two drinks per month.      Drugs: none      Exercise:  Walking and cycling.     Review of Systems: General: negative for chills, fever, night sweats or weight changes.  Cardiovascular: negative for chest pain, dyspnea on exertion, edema, orthopnea, palpitations, paroxysmal nocturnal dyspnea or shortness of breath Dermatological: negative for rash Respiratory: negative for cough or wheezing Urologic: negative for hematuria Abdominal: negative for nausea, vomiting, diarrhea, bright red blood per rectum, melena, or hematemesis Neurologic: negative for visual changes, syncope, or dizziness All other systems reviewed and are otherwise negative except as noted above.   Physical Exam:  Height 5' 11.5" (1.816 m).  General appearance: alert, cooperative and no distress Neck: no adenopathy and no carotid bruit Lungs: clear to auscultation bilaterally Heart: regular rate and rhythm, S1, S2 normal, no murmur, click, rub or gallop Extremities: extremities normal, atraumatic, no cyanosis or edema Pulses: 2+ and symmetric Skin: Skin color, texture, turgor normal. No rashes or lesions Neurologic: Grossly normal  EKG NSR. No ischemia -- personally reviewed   ASSESSMENT AND PLAN:   1. CAD:  he was admitted for NSTEMI 09/2015 with cath showing single vessel CAD with severe ulcerated thrombotic stenosis involving the RCA. This was treated with PTCA +DES. LVEF was normal at 60-65%. Stable w/o CP. He has completed TWILIGHT Trial. Plan is to transition off of Brilinta. He will start ASA 81 and Plavix 75 mg. He is aware of this change. Continue statin and BB therapy.   2. HTN: mildly elevated but he just had a fast food lunch. Low sodium diet advised. He was instructed to monitor his BP at home and to contact us if persistent SBP in the 140s or greater. Little room in HR to increase BB at this time. Continue  current regimen for now. Controlled on current regimen.   3. OSA: reports compliance with CPAP therapy. Followed by Dr. Mayford Knife.   4. HLD: controlled in Dec 2017. LDL was 62 however he has been off of Zetia for a few months due to intolerance. Currently on Pravastatin only. Did not tolerate Lipitor or Crestor in the past. Check FLP and HFTs. If LDL not < 70 mg/dL we will refer to lipid clinic for PCSK9  Follow-Up w/ Dr. Mayford Knife in 6 months.   Kaylie Ritter Delmer Islam, MHS [facility] 12/07/2016 3:22 PM

## 2016-12-07 NOTE — Telephone Encounter (Signed)
Follow up   [facility]

## 2016-12-07 NOTE — Telephone Encounter (Signed)
Follow up ° ° ° °Pt is calling back for Raymond Jordan. °

## 2016-12-08 ENCOUNTER — Other Ambulatory Visit: Payer: 59 | Admitting: *Deleted

## 2016-12-08 DIAGNOSIS — E7800 Pure hypercholesterolemia, unspecified: Secondary | ICD-10-CM

## 2016-12-08 DIAGNOSIS — I10 Essential (primary) hypertension: Secondary | ICD-10-CM

## 2016-12-08 DIAGNOSIS — Z006 Encounter for examination for normal comparison and control in clinical research program: Secondary | ICD-10-CM | POA: Diagnosis not present

## 2016-12-08 DIAGNOSIS — I2510 Atherosclerotic heart disease of native coronary artery without angina pectoris: Secondary | ICD-10-CM | POA: Diagnosis not present

## 2016-12-08 LAB — HEPATIC FUNCTION PANEL
ALT: 14 IU/L (ref 0–44)
AST: 19 IU/L (ref 0–40)
Albumin: 4 g/dL (ref 3.5–5.5)
Alkaline Phosphatase: 65 IU/L (ref 39–117)
Bilirubin Total: 0.3 mg/dL (ref 0.0–1.2)
Bilirubin, Direct: 0.08 mg/dL (ref 0.00–0.40)
TOTAL PROTEIN: 6.5 g/dL (ref 6.0–8.5)

## 2016-12-08 LAB — LIPID PANEL
CHOL/HDL RATIO: 3.4 ratio (ref 0.0–5.0)
Cholesterol, Total: 154 mg/dL (ref 100–199)
HDL: 45 mg/dL (ref >39)
LDL CALC: 88 mg/dL (ref 0–99)
TRIGLYCERIDES: 106 mg/dL (ref 0–149)
VLDL CHOLESTEROL CAL: 21 mg/dL (ref 5–40)

## 2016-12-09 ENCOUNTER — Telehealth: Payer: Self-pay | Admitting: *Deleted

## 2016-12-09 DIAGNOSIS — Z79899 Other long term (current) drug therapy: Secondary | ICD-10-CM

## 2016-12-09 NOTE — Telephone Encounter (Signed)
-----   Message from Allayne ButcherBrittainy M Simmons, [place]-C sent at 12/08/2016  6:15 PM EDT ----- His LDL (bad cholesterol) is elevated at 88. Given his heart history, we need this number to be less than 70 to reduce risk for recurrent blockages in heart arteries. His liver function is normal. He needs to restart Zetia. Recheck FLP and HFTs in 6 weeks to see if Zetia is helping. If not at goal, then we will refer him to our lipid clinic to meet with pharmacist to discuss other options for lowering his cholesterol. Make sure he is also fully compliant with his statin every night. Eat a low fat diet and try to exercise when able.

## 2016-12-25 ENCOUNTER — Encounter (HOSPITAL_COMMUNITY): Payer: Self-pay | Admitting: Nurse Practitioner

## 2016-12-25 ENCOUNTER — Emergency Department (HOSPITAL_COMMUNITY)
Admission: EM | Admit: 2016-12-25 | Discharge: 2016-12-26 | Disposition: A | Discharge status: Home / Self Care | Payer: 59 | Attending: Emergency Medicine | Admitting: Emergency Medicine

## 2016-12-25 DIAGNOSIS — I2510 Atherosclerotic heart disease of native coronary artery without angina pectoris: Secondary | ICD-10-CM | POA: Diagnosis not present

## 2016-12-25 DIAGNOSIS — S90862A Insect bite (nonvenomous), left foot, initial encounter: Secondary | ICD-10-CM | POA: Diagnosis present

## 2016-12-25 DIAGNOSIS — Z79899 Other long term (current) drug therapy: Secondary | ICD-10-CM | POA: Insufficient documentation

## 2016-12-25 DIAGNOSIS — Z955 Presence of coronary angioplasty implant and graft: Secondary | ICD-10-CM | POA: Insufficient documentation

## 2016-12-25 DIAGNOSIS — Y999 Unspecified external cause status: Secondary | ICD-10-CM | POA: Diagnosis not present

## 2016-12-25 DIAGNOSIS — W57XXXA Bitten or stung by nonvenomous insect and other nonvenomous arthropods, initial encounter: Secondary | ICD-10-CM | POA: Diagnosis not present

## 2016-12-25 DIAGNOSIS — I10 Essential (primary) hypertension: Secondary | ICD-10-CM | POA: Diagnosis not present

## 2016-12-25 DIAGNOSIS — J45909 Unspecified asthma, uncomplicated: Secondary | ICD-10-CM | POA: Diagnosis not present

## 2016-12-25 DIAGNOSIS — Z87891 Personal history of nicotine dependence: Secondary | ICD-10-CM | POA: Insufficient documentation

## 2016-12-25 DIAGNOSIS — Y939 Activity, unspecified: Secondary | ICD-10-CM | POA: Diagnosis not present

## 2016-12-25 DIAGNOSIS — Y929 Unspecified place or not applicable: Secondary | ICD-10-CM | POA: Insufficient documentation

## 2016-12-25 NOTE — ED Triage Notes (Signed)
Pt states about 2 hours gago he may have been biten by insect, on the pedal pulse aspect of his left foot, mild rash noticeable. Pain 5/10.

## 2016-12-25 NOTE — Discharge Instructions (Signed)
Topical hydrocortisone cream.  Benadryl if any itching.

## 2016-12-26 ENCOUNTER — Other Ambulatory Visit: Payer: Self-pay | Admitting: Cardiology

## 2016-12-26 NOTE — ED Provider Notes (Signed)
CSN: [id number]     Arrival date & time 12/25/16  2207 History   None    Chief Complaint  Patient presents with  . Insect Bite   (Consider location/radiation/quality/duration/timing/severity/associated sxs/prior Treatment) The history is provided by the patient. No language interpreter was used.  Foot Injury  Location:  Foot Time since incident:  3 hours Injury: yes   Foot location:  L foot Pain details:    Quality:  Aching   Radiates to:  Does not radiate   Severity:  No pain   Timing:  Constant   Progression:  Resolved Chronicity:  New Tetanus status:  Up to date Relieved by:  Nothing Worsened by:  Nothing Ineffective treatments:  None tried Pt thinks something bit him.  Pt reports an area of swelling popped up on his foot.  Pt was in the yard barefoot and he felt something.  Pt worried about snake bite.  He did not see a snake.  Pt reports swelling has gone down   Past Medical History:  Diagnosis Date  . Allergic rhinitis    /asthma  . Arthritis    "shoulders, back, knees" (09/16/2015)  . CAD (coronary artery disease), native coronary artery 09/15/2015   s/p NSTEMI 09/2015 with cath showing 99% RCA s/p DES now on DAPT  . Childhood asthma   . ED (erectile dysfunction)   . Hypercholesteremia   . Hyperlipidemia LDL goal <70   . Hypertension   . 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 Obstructive sleep apnea on CPAP   . Osteoarthritis    OA knees, shoulders; DDD lumbar spine  . Seasonal allergies    Past Surgical History:  Procedure Laterality Date  . ANKLE SURGERY  1985   bone spur removal ankle.  Marland Kitchen. CARDIAC CATHETERIZATION N/A 09/16/2015   Procedure: Left Heart Cath and Coronary Angiography;  Surgeon: Kathleene Hazelhristopher D McAlhany, MD;  [facility];  Service: Cardiovascular;  Laterality: N/A;  . CARDIAC CATHETERIZATION N/A 09/16/2015   Procedure: Coronary Stent Intervention;  Surgeon: Kathleene Hazelhristopher D McAlhany, MD;  [facility];  Service:  Cardiovascular;  Laterality: N/A;  . MYRINGOTOMY WITH TUBE PLACEMENT Left ~ 07/2009  . NASAL SINUS SURGERY  ~ 2007   "& fixed my septum"  . SHOULDER OPEN ROTATOR CUFF REPAIR Right 10/2013  . VASECTOMY    . WISDOM TOOTH EXTRACTION  ~ 1992   Family History  Problem Relation Age of Onset  . Asthma Mother        sarcoidosis also  . Diabetes Mother   . Stroke Mother   . Sarcoidosis Mother   . Stroke Father   . Cancer Father        R breast cancer  . Heart disease Father        AMI/CAD with stenting  . Arthritis Father   . Heart attack Father    Social History  Substance Use Topics  . Smoking status: Former Games developermoker  . Smokeless tobacco: Never Used     Comment: "smoked 2 packs of cigaretes in my whole life"  . Alcohol use Yes     Comment: 09/16/2015 "might have a couple drinks q couple months"    Review of Systems  All other systems reviewed and are negative.   Allergies  Crestor [rosuvastatin] and Lipitor [atorvastatin]  Home Medications   Prior to Admission medications   Medication Sig Start Date End Date Taking? Authorizing Provider  B Complex-Biotin-FA (SUPER B-COMPLEX) TABS Take 1 tablet by mouth daily.  [provider]  cetirizine (ZYRTEC) 10 MG tablet Take 10 mg by mouth daily as needed for allergies.     [provider]  ezetimibe (ZETIA) 10 MG tablet Take 1 tablet (10 mg total) by mouth daily. 12/07/16 03/07/17  Robbie Lis M, PA-C  fluticasone (FLONASE) 50 MCG/ACT nasal spray Place 2 sprays into both nostrils as needed for allergies or rhinitis.    [provider]  Investigational - Study Medication Take 1 tablet by mouth 2 (two) times daily. Study name: twilight study Additional study details: Ticagrelor 90 mg    [provider]  latanoprost (XALATAN) 0.005 % ophthalmic solution Place 1 drop into both eyes at bedtime.    [provider]  metoprolol tartrate (LOPRESSOR) 25 MG tablet Take 1 tablet (25 mg total) by mouth  2 (two) times daily. 12/16/15   Quintella Reichert, MD  nitroGLYCERIN (NITROSTAT) 0.4 MG SL tablet Place 1 tablet (0.4 mg total) under the tongue every 5 (five) minutes as needed for chest pain. 09/20/15   Quintella Reichert, MD  pravastatin (PRAVACHOL) 80 MG tablet Take 1 tablet (80 mg total) by mouth daily. 08/31/16   Quintella Reichert, MD  timolol (TIMOPTIC) 0.5 % ophthalmic solution Place 1 drop into both eyes every morning. 11/24/16   [provider]  VENTOLIN HFA 108 (90 Base) MCG/ACT inhaler Inhale 2 puffs into the lungs every 4 (four) hours as needed. Shortness of breath or wheezing 12/05/15   [provider]   Meds Ordered and Administered this Visit  Medications - No data to display  BP (!) 141/92 (BP Location: Left Arm)   Pulse 71   Temp 98.7 F (37.1 C) (Oral)   Resp 16   SpO2 100%  No data found.   Physical Exam  Constitutional: He is oriented to person, place, and time. He appears well-developed and well-nourished.  HENT:  Head: Normocephalic.  Eyes: EOM are normal.  Neck: Normal range of motion.  Pulmonary/Chest: Effort normal.  Abdominal: He exhibits no distension.  Musculoskeletal: Normal range of motion.  I do not see a puncture wound or a bug bite.  Pt has a 1cm tender area,  No swelling.  (pt reports area was swollen earlier)   Neurological: He is alert and oriented to person, place, and time.  Skin: Skin is warm.  Psychiatric: He has a normal mood and affect.  Nursing note and vitals reviewed.   Urgent Care Course     Procedures (including critical care time)  Labs Review Labs Reviewed - No data to display  Imaging Review No results found.   Visual Acuity Review  Right Eye Distance:   Left Eye Distance:   Bilateral Distance:    Right Eye Near:   Left Eye Near:    Bilateral Near:         MDM  I reassured pt I doubt snake bite,  Monitor for increased redness or swelling.  Return if any problems,.   1. Insect bite, initial encounter      An After Visit Summary was printed and given to the patient.   Elson Areas, PA-C 12/26/16 1018    Rolan Bucco, MD 12/26/16 [phone]

## 2016-12-29 ENCOUNTER — Telehealth: Payer: Self-pay | Admitting: Cardiology

## 2016-12-29 NOTE — Telephone Encounter (Signed)
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

## 2016-12-30 ENCOUNTER — Telehealth: Payer: Self-pay | Admitting: Family Medicine

## 2016-12-30 NOTE — Telephone Encounter (Signed)
I do not know anything about this, sorry

## 2016-12-30 NOTE — Telephone Encounter (Signed)
Pt has a DOT OV on 01/01/17 with Dr. Clelia CroftShaw. He would like to know if he brings a letter from his cardiologist that he is in compliance with his C-PAP machine, will this work for him to get his DOT card. Please advise at [phone]

## 2016-12-30 NOTE — Telephone Encounter (Signed)
He will need to come in to see an extender for his DOT as I am not in the office for 3 weeks and he has not been seen in 6 months.  He will also need a download on his CPAP by [employer] [place] to document compliance of > 70%

## 2016-12-30 NOTE — Telephone Encounter (Signed)
Called and patient made aware that the download of his CPAP was just received from [place] and that it reported 83% compliance. Made patient aware that I will speak to Robbie LisBrittainy Simmons, PA tomorrow regarding writing his note. Patient verbalized understanding.

## 2016-12-30 NOTE — Telephone Encounter (Signed)
Will forward to Dr. Mayford Knifeurner for letter of compliance for DOT.

## 2016-12-30 NOTE — Telephone Encounter (Signed)
°  Follow Up  Pt calling to follow up on letter of compliance for DOT needed by Saturday 6/30. Requesting documentation be faxed [phone] Attn: Marius DitchHoward Strohmeyer.  Please call.

## 2016-12-30 NOTE — Telephone Encounter (Signed)
Compliance download printed and sent to scan.

## 2016-12-30 NOTE — Telephone Encounter (Signed)
I am out of town and not in clinic for 3 weeks - this will need to be dealt with by an extender or the DOD to sign a note

## 2016-12-30 NOTE — Telephone Encounter (Signed)
There are no appointments with an extender until next week and the patient has an appointment for his DOT on Saturday.  Patient was just seen in the office by Robbie LisBrittainy Simmons, PA on 12/07/16. Will this be okay? Please advise.  [employer] [place] is working on getting a Nurse, adultdownload of his CPAP.

## 2016-12-31 ENCOUNTER — Telehealth: Payer: Self-pay | Admitting: *Deleted

## 2016-12-31 NOTE — Telephone Encounter (Signed)
Informed patient of compliance results and he verbalized understanding. Patient understands his settings will not change. Patient understands he needs to improve his compliance. Patient was grateful for the call and thanked me.

## 2016-12-31 NOTE — Telephone Encounter (Signed)
-----   Message from Quintella Reichertraci R Turner, MD sent at 12/31/2016  3:17 PM EDT ----- Good AHI on CPAP but needs to improve compliance

## 2016-12-31 NOTE — Telephone Encounter (Signed)
Letter and compliance report provided to patient.

## 2017-01-01 NOTE — Telephone Encounter (Signed)
Left message on VM to call back

## 2017-01-01 NOTE — Telephone Encounter (Signed)
Is this patient coming to see you tomorrow ? And will this be enough ?

## 2017-01-01 NOTE — Telephone Encounter (Signed)
Patient has what we need. Just wanted to make sure we didn't have any paperwork that he needed to bring to his cardiologist. Pt should be all set for tomorrow.

## 2017-01-01 NOTE — Telephone Encounter (Signed)
No - we need the print out from the company of [employer] recently (like within the past 3 months) of his "cpap compliance report" which will show precisely how often he uses it and for how long. We have to document that he uses it more than 4 hours/night for over 75% of the time in a recent 1 month time span. He can call the cpap company and have them fax us this report if he would like.

## 2017-01-02 ENCOUNTER — Ambulatory Visit (INDEPENDENT_AMBULATORY_CARE_PROVIDER_SITE_OTHER): Payer: 59 | Admitting: Family Medicine

## 2017-01-02 ENCOUNTER — Encounter: Payer: Self-pay | Admitting: Family Medicine

## 2017-01-02 VITALS — BP 150/90 | HR 63 | Temp 99.0°F | Resp 16 | Ht 70.5 in | Wt 261.6 lb

## 2017-01-02 DIAGNOSIS — Z024 Encounter for examination for driving license: Secondary | ICD-10-CM

## 2017-01-02 NOTE — Progress Notes (Addendum)
Commercial Driver Medical Examination   Raymond Jordan is a 56 y.o. male who presents today for a commercial driver fitness determination physical exam. The patient reports problems - h/o CAD, MI 09/2015.  Did develop glaucoma followed by Dr. Burgess Estelle.  Lumbar DDD with sciatica. Dr. Kevan Ny is at [facility] The following portions of the patient's history were reviewed and updated as appropriate: allergies, current medications, past family history, past medical history, past social history, past surgical history and problem list. Review of Systems Pertinent items are noted in HPI.   Objective:    Vision:   Visual Acuity Screening   Right eye Left eye Both eyes  Without correction:     With correction: 20/15 20/15 20/20   Comments: Peripheral Vision: Right eye 85 degrees. Left eye 85 degrees.  The patient can distinguish the colors red, amber and green.   Applicant can recognize and distinguish among traffic control signals and devices showing standard red, green, and amber colors.  Applicant meets visual acuity requirement only when wearing corrective lenses.  Monocular Vision?: No   Hearing:  Hearing Screening Comments: The patient was able to hear a forced whisper from 10 feet.    BP (!) 150/90 (BP Location: Left Arm, Patient Position: Sitting, Cuff Size: Large)   Pulse 63   Temp 99 F (37.2 C) (Oral)   Resp 16   Ht 5' 10.5" (1.791 m)   Wt 261 lb 9.6 oz (118.7 kg)   SpO2 99%   BMI 37.01 kg/m   General Appearance:    Alert, cooperative, no distress, appears stated age  Head:    Normocephalic, without obvious abnormality, atraumatic  Eyes:    PERRL, conjunctiva/corneas clear, EOM's intact, fundi    benign, both eyes       Ears:    Normal TM's and external ear canals, both ears  Nose:   Nares normal, septum midline, mucosa normal, no drainage    or sinus tenderness  Throat:   Lips, mucosa, and tongue normal; teeth and gums normal  Neck:   Supple, symmetrical,  trachea midline, no adenopathy;       thyroid:  No enlargement/tenderness/nodules; no carotid   bruit or JVD  Back:     Symmetric, no curvature, ROM normal, no CVA tenderness  Lungs:     Clear to auscultation bilaterally, respirations unlabored  Chest wall:    No tenderness or deformity  Heart:    Regular rate and rhythm, S1 and S2 normal, no murmur, rub   or gallop  Abdomen:     Soft, non-tender, bowel sounds active all four quadrants,    no masses, no organomegaly        Extremities:   Extremities normal, atraumatic, no cyanosis or edema  Pulses:   2+ and symmetric all extremities  Skin:   Skin color, texture, turgor normal, no rashes or lesions  Lymph nodes:   Cervical, supraclavicular, and axillary nodes normal  Neurologic:   CNII-XII intact. Normal strength, sensation and reflexes      throughout    Labs: SG 1.025, neg bld, neg gluc, neg prot  OSA report reviewed and scanned in - 80%  Compliance with > 4 hr use 6/1-30/2018.  Cardiology clearance to drive letter reviewed and in chart,  Assessment:    Healthy male exam.  Meets standards, but periodic monitoring required due to CAD, OSA, and uncontrolled HTN.  Driver qualified only for 3 months.    Plan:    Medical examiners certificate completed  and printed. Return as needed.  Will extend card after he sees his PCP and has them fax me a note that he has had 2 documented BP readings <140/90 within the next 3 mos - then will extend card to 1 yr from today - 01/02/2018 (with vision correction.)  Advised pt that he should likely start on acei/arb anyway due to CAD/MI.   Reminded pt that the DOT does require a biannual cardiac stress test so he will need to have this done prior to his exam next year.  Addendum: [facility] Patient seen by Dr. Shirlean Mylararol Webb on 01/18/2017. BP 138/85. Patient started on HCTZ 25 mg by mouth daily in addition to continuing metoprolol 25 mg twice a day. Patient seen in follow-up  01/27/2017 by Miss Shaune Pollackonna Gates. BP 136/77. Reissued one year card from date of full DOT exam. Card expires 01/02/2018.

## 2017-01-02 NOTE — Patient Instructions (Signed)
     IF you received an x-ray today, you will receive an invoice from [facility]. Please contact [facility] at [phone] with questions or concerns regarding your invoice.   IF you received labwork today, you will receive an invoice from [facility]. Please contact [facility] at [phone] with questions or concerns regarding your invoice.   Our billing staff will not be able to assist you with questions regarding bills from these companies.  You will be contacted with the lab results as soon as they are available. The fastest way to get your results is to activate your My Chart account. Instructions are located on the last page of this paperwork. If you have not heard from us regarding the results in 2 weeks, please contact this office.     

## 2017-01-03 ENCOUNTER — Other Ambulatory Visit: Payer: Self-pay | Admitting: Cardiology

## 2017-01-04 ENCOUNTER — Telehealth: Payer: Self-pay

## 2017-01-04 ENCOUNTER — Telehealth: Payer: Self-pay | Admitting: Cardiology

## 2017-01-04 DIAGNOSIS — I214 Non-ST elevation (NSTEMI) myocardial infarction: Secondary | ICD-10-CM

## 2017-01-04 DIAGNOSIS — I2510 Atherosclerotic heart disease of native coronary artery without angina pectoris: Secondary | ICD-10-CM

## 2017-01-04 NOTE — Telephone Encounter (Signed)
Reiterated to patient to call PCP to arrange BP checks to get 2 acceptable readings for Dr. Clelia CroftShaw. Reviewed myoview instructions and test ordered for scheduling.  Patient understands to fast 2 hours prior to test (except water), to avoid caffeine and decaffeinate products, chocolate and nicotine 12 hours prior to the test, and to hold Metoprolol the AM of his test, and to come dressed prepared to exercise.  Patient was grateful for call and agrees with treatment plan.

## 2017-01-04 NOTE — Telephone Encounter (Signed)
See other 7/2 phone note.

## 2017-01-04 NOTE — Telephone Encounter (Signed)
Follow up ° ° ° °Pt is calling back for Raymond Jordan. °

## 2017-01-04 NOTE — Telephone Encounter (Signed)
-----   Message from Dossie ArbourPatricia L Adelman, RN sent at 01/04/2017 12:09 PM EDT -----   ----- Message ----- From: Quintella Reicherturner, Traci R, MD Sent: 01/04/2017  11:18 AM To: [facility] Triage  Please order Stress myoview for DOT clearance  Armanda Magicraci Turner ----- Message ----- From: Sherren MochaShaw, Eva N, MD Sent: 01/04/2017  12:28 AM To: Quintella Reichertraci R Turner, MD  Dr. Mayford Knifeurner -  Just FYI. 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 It does not appear pt has had one done since his MI with DES plcmnt 09/2015 though he did complete cardiac rehab which I think suffices for the 6 mo requirement. 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  Pt sees you next in Dec. Thanks,  Carley Hammedva

## 2017-01-04 NOTE — Telephone Encounter (Signed)
New message    Pt is calling about his bp.   Pt c/o BP issue: STAT if pt c/o blurred vision, one-sided weakness or slurred speech  1. What are your last 5 BP readings? 155/88  2. Are you having any other symptoms (ex. Dizziness, headache, blurred vision, passed out)? no  3. What is your BP issue? Pt wants to talk about his bp. He went for a Dot physical on Saturday and was told his bp is too high.

## 2017-01-05 NOTE — Telephone Encounter (Signed)
Myoview has been scheduled 7/26.

## 2017-01-12 ENCOUNTER — Telehealth: Payer: Self-pay | Admitting: Cardiology

## 2017-01-12 DIAGNOSIS — R195 Other fecal abnormalities: Secondary | ICD-10-CM

## 2017-01-12 DIAGNOSIS — K922 Gastrointestinal hemorrhage, unspecified: Secondary | ICD-10-CM | POA: Diagnosis not present

## 2017-01-12 NOTE — Telephone Encounter (Signed)
Patient called to report dark (almost black) stool for about 1 week. He waited a week to call to make sure the discoloration wasn't caused by something he ate. He states he has hemorrhoids and if there has been any frank blood, it is nothing more than usual. The only difference is the very dark stool. He has no other signs/symptoms of bleeding and has no other complaints.  Patient had successful PTCA/DES x 1 proximal RCA March 2017. He was in the [place] Study that ended 12/07/16. He started ASA 81 mg daily and Plavix 75 mg daily that day.  He understands he will be called with further recommendations.

## 2017-01-12 NOTE — Telephone Encounter (Signed)
New message   Pt c/o medication issue:  1. Name of Medication: clopidogrel (PLAVIX) 75 MG tablet   2. How are you currently taking this medication (dosage and times per day)? 75mg   3. Are you having a reaction (difficulty breathing--STAT)? Dark stools  4. What is your medication issue? Pt noticing dark stools and is concerned about it

## 2017-01-12 NOTE — Telephone Encounter (Signed)
Discussed with B. Sharol HarnessSimmons, [place]. Confirmed with patient he is not taking any pepto, NSAIDs (he states he took one Tylenol on Sunday but tries not to take them), or alcohol. Patient is out of town now.  Gave patient the choice to either 1) come in tomorrow for CBC (with diff) and to pick up stool cards or 2) to go to Urgent Care now. He states he will go to Urgent Care for evaluation and call tomorrow to update. He was grateful for assistance.

## 2017-01-13 ENCOUNTER — Other Ambulatory Visit: Payer: 59 | Admitting: *Deleted

## 2017-01-13 DIAGNOSIS — R195 Other fecal abnormalities: Secondary | ICD-10-CM

## 2017-01-13 NOTE — Telephone Encounter (Signed)
Follow Up Call:  Raymond Jordan is calling back to let you know what is going on . Please call

## 2017-01-13 NOTE — Telephone Encounter (Signed)
No blood work was drawn.

## 2017-01-13 NOTE — Telephone Encounter (Signed)
Please find out if a Hbg was ordered

## 2017-01-13 NOTE — Telephone Encounter (Signed)
[facility] last night for evaluation. A rectal exam was performed and hemocult positive stool was confirmed. A referral to GI was placed but it is not scheduled yet. He states the MD "didn't seem that concerned about it and didn't seem like it was that much of a 'big deal.'" No instructions were given on Plavix or ASA.  The patient remains asymptomatic other than dark stools.  The patient requests a call with medication recommendations prior to seeing GI.  To Dr. Mayford Knifeurner for recommendations.

## 2017-01-13 NOTE — Telephone Encounter (Signed)
Patient is coming in today for CBC.

## 2017-01-13 NOTE — Telephone Encounter (Signed)
Have him come in for a Hbg

## 2017-01-15 LAB — CBC WITH DIFFERENTIAL/PLATELET
BASOS: 0 %
Basophils Absolute: 0 10*3/uL (ref 0.0–0.2)
EOS (ABSOLUTE): 0.1 10*3/uL (ref 0.0–0.4)
Eos: 1 %
Hematocrit: 34.4 % — ABNORMAL LOW (ref 37.5–51.0)
Hemoglobin: 12 g/dL — ABNORMAL LOW (ref 13.0–17.7)
LYMPHS ABS: 2.7 10*3/uL (ref 0.7–3.1)
Lymphs: 40 %
MCH: 28.1 pg (ref 26.6–33.0)
MCHC: 34.9 g/dL (ref 31.5–35.7)
MCV: 81 fL (ref 79–97)
MONOS ABS: 0.5 10*3/uL (ref 0.1–0.9)
Monocytes: 8 %
NEUTROS ABS: 3.5 10*3/uL (ref 1.4–7.0)
Neutrophils: 51 %
Platelets: 185 10*3/uL (ref 150–379)
RBC: 4.27 x10E6/uL (ref 4.14–5.80)
RDW: 14.3 % (ref 12.3–15.4)
WBC: 6.8 10*3/uL (ref 3.4–10.8)

## 2017-01-15 NOTE — Telephone Encounter (Addendum)
Patient came in for CBC when EPIC was down.  Labs were uploaded incorrectly under the date 11/13/16. Spoke with [facility] and they are working to correct. Received paper results from 7/11 blood draw. Hgb= 12 Hct= 34.4  To Dr. Mayford Knifeurner for Plavix/ASA recommendations as patient tested hemocult positive.

## 2017-01-15 NOTE — Telephone Encounter (Signed)
Continue on Plavix and ASA for now until seen by GI

## 2017-01-15 NOTE — Telephone Encounter (Signed)
Patient will continue Plavix and ASA for now. GI has not called to schedule OV yet. Instructed him to call PCP to follow-up with this.  He will call if S/S of bleeding occur or worsen. Patient was grateful for assistance.

## 2017-01-18 DIAGNOSIS — I10 Essential (primary) hypertension: Secondary | ICD-10-CM | POA: Diagnosis not present

## 2017-01-18 DIAGNOSIS — H401111 Primary open-angle glaucoma, right eye, mild stage: Secondary | ICD-10-CM | POA: Diagnosis not present

## 2017-01-18 DIAGNOSIS — H401122 Primary open-angle glaucoma, left eye, moderate stage: Secondary | ICD-10-CM | POA: Diagnosis not present

## 2017-01-19 DIAGNOSIS — K921 Melena: Secondary | ICD-10-CM | POA: Diagnosis not present

## 2017-01-26 ENCOUNTER — Telehealth (HOSPITAL_COMMUNITY): Payer: Self-pay | Admitting: *Deleted

## 2017-01-26 NOTE — Telephone Encounter (Signed)
Patient given detailed instructions per Myocardial Perfusion Study Information Sheet for the test on 01/28/17 at 7:15. Patient notified to arrive 15 minutes early and that it is imperative to arrive on time for appointment to keep from having the test rescheduled. ° If you need to cancel or reschedule your appointment, please call the office within 24 hours of your appointment. . Patient verbalized understanding.Sharon S Brooks ° ° ° °

## 2017-01-27 DIAGNOSIS — I10 Essential (primary) hypertension: Secondary | ICD-10-CM | POA: Diagnosis not present

## 2017-01-28 ENCOUNTER — Other Ambulatory Visit: Payer: 59 | Admitting: *Deleted

## 2017-01-28 ENCOUNTER — Ambulatory Visit (HOSPITAL_COMMUNITY): Payer: 59 | Attending: Cardiology

## 2017-01-28 DIAGNOSIS — I2510 Atherosclerotic heart disease of native coronary artery without angina pectoris: Secondary | ICD-10-CM

## 2017-01-28 DIAGNOSIS — Z79899 Other long term (current) drug therapy: Secondary | ICD-10-CM

## 2017-01-28 DIAGNOSIS — Z021 Encounter for pre-employment examination: Secondary | ICD-10-CM | POA: Diagnosis not present

## 2017-01-28 DIAGNOSIS — I214 Non-ST elevation (NSTEMI) myocardial infarction: Secondary | ICD-10-CM | POA: Diagnosis not present

## 2017-01-28 LAB — MYOCARDIAL PERFUSION IMAGING
CHL CUP MPHR: 167 {beats}/min
CHL CUP NUCLEAR SDS: 5
CSEPED: 9 min
CSEPHR: 90 %
CSEPPHR: 151 {beats}/min
Estimated workload: 10.1 METS
Exercise duration (sec): 0 s
LHR: 0.31
LV sys vol: 82 mL
LVDIAVOL: 158 mL (ref 62–150)
RPE: 18
Rest HR: 55 {beats}/min
SRS: 1
SSS: 6
TID: 0.95

## 2017-01-28 LAB — LIPID PANEL
Chol/HDL Ratio: 2.3 ratio (ref 0.0–5.0)
Cholesterol, Total: 126 mg/dL (ref 100–199)
HDL: 54 mg/dL (ref >39)
LDL Calculated: 59 mg/dL (ref 0–99)
TRIGLYCERIDES: 65 mg/dL (ref 0–149)
VLDL CHOLESTEROL CAL: 13 mg/dL (ref 5–40)

## 2017-01-28 LAB — HEPATIC FUNCTION PANEL
ALBUMIN: 4.2 g/dL (ref 3.5–5.5)
ALK PHOS: 61 IU/L (ref 39–117)
ALT: 19 IU/L (ref 0–44)
AST: 25 IU/L (ref 0–40)
Bilirubin, Direct: 0.07 mg/dL (ref 0.00–0.40)
TOTAL PROTEIN: 6.9 g/dL (ref 6.0–8.5)

## 2017-01-28 MED ORDER — TECHNETIUM TC 99M TETROFOSMIN IV KIT
10.1000 | PACK | Freq: Once | INTRAVENOUS | Status: AC | PRN
  Administered 2017-01-28: 10.1 via INTRAVENOUS
  Filled 2017-01-28: qty 11

## 2017-01-28 MED ORDER — TECHNETIUM TC 99M TETROFOSMIN IV KIT
32.3000 | PACK | Freq: Once | INTRAVENOUS | Status: AC | PRN
  Administered 2017-01-28: 32.3 via INTRAVENOUS
  Filled 2017-01-28: qty 33

## 2017-01-30 ENCOUNTER — Other Ambulatory Visit: Payer: Self-pay | Admitting: Cardiology

## 2017-02-01 ENCOUNTER — Telehealth: Payer: Self-pay | Admitting: Cardiology

## 2017-02-01 DIAGNOSIS — K3189 Other diseases of stomach and duodenum: Secondary | ICD-10-CM | POA: Diagnosis not present

## 2017-02-01 DIAGNOSIS — K921 Melena: Secondary | ICD-10-CM | POA: Diagnosis not present

## 2017-02-01 NOTE — Telephone Encounter (Signed)
Did not see phone note until after 11:45. Called patients phone and spoke with wife. She verbalized her name and the patients date of birth. Per DPR we are allowed to speak to wife on patients behalf. Advised her of patients lab results. She verbalized understanding and will relay to patient once he is out of his procedure.

## 2017-02-01 NOTE — Telephone Encounter (Signed)
Follow Up:    Pt says please call today before 11:45,he has a procedure today.

## 2017-02-01 NOTE — Telephone Encounter (Signed)
New Message ° ° pt verbalized that he is returning call for rn  °

## 2017-03-10 ENCOUNTER — Telehealth: Payer: Self-pay | Admitting: *Deleted

## 2017-03-10 NOTE — Telephone Encounter (Addendum)
18  Month phone follow up for TWILIGHT study completed today. He is doing well.  Pt is still taking Plavix and ASA daily, with no cessation. Had an endoscopy last month due to melena and started Pantoprolazole. Called Gastro office and got records faxed to us. Per pt he didn't stop taking his Plavix or ASA.  Since he has had no problems. No complaints of cp, sob, or bleeding in gums or nose bleeds. Thanked patient for participating in the study. He will continue to follow up with his cardiologist.

## 2017-03-11 NOTE — Telephone Encounter (Signed)
Pt returned call about date of endoscopy. Procedure was [facility] on July 30th will call and get records sent over.

## 2017-05-03 DIAGNOSIS — E7800 Pure hypercholesterolemia, unspecified: Secondary | ICD-10-CM | POA: Diagnosis not present

## 2017-05-03 DIAGNOSIS — Z23 Encounter for immunization: Secondary | ICD-10-CM | POA: Diagnosis not present

## 2017-05-03 DIAGNOSIS — Z0000 Encounter for general adult medical examination without abnormal findings: Secondary | ICD-10-CM | POA: Diagnosis not present

## 2017-05-03 DIAGNOSIS — Z125 Encounter for screening for malignant neoplasm of prostate: Secondary | ICD-10-CM | POA: Diagnosis not present

## 2017-05-03 DIAGNOSIS — I10 Essential (primary) hypertension: Secondary | ICD-10-CM | POA: Diagnosis not present

## 2017-05-03 DIAGNOSIS — I252 Old myocardial infarction: Secondary | ICD-10-CM | POA: Diagnosis not present

## 2017-05-04 DIAGNOSIS — M65312 Trigger thumb, left thumb: Secondary | ICD-10-CM | POA: Diagnosis not present

## 2017-05-04 DIAGNOSIS — M65311 Trigger thumb, right thumb: Secondary | ICD-10-CM | POA: Diagnosis not present

## 2017-05-04 DIAGNOSIS — M1712 Unilateral primary osteoarthritis, left knee: Secondary | ICD-10-CM | POA: Diagnosis not present

## 2017-05-12 DIAGNOSIS — M65311 Trigger thumb, right thumb: Secondary | ICD-10-CM | POA: Diagnosis not present

## 2017-05-12 DIAGNOSIS — M65312 Trigger thumb, left thumb: Secondary | ICD-10-CM | POA: Diagnosis not present

## 2017-05-13 ENCOUNTER — Other Ambulatory Visit: Payer: Self-pay | Admitting: Orthopedic Surgery

## 2017-05-30 DIAGNOSIS — R05 Cough: Secondary | ICD-10-CM | POA: Diagnosis not present

## 2017-06-23 ENCOUNTER — Telehealth: Payer: Self-pay | Admitting: Cardiology

## 2017-06-23 NOTE — Progress Notes (Signed)
Reviewed pts chart and medical history with Dr. Malen GauzeFoster, pt on dual anticoagulant therapy s/p MI last year. Dr.Foster requesting cardiac review and clearance prior to surgery. Left message for Fleet ContrasRhonda Erp at Dr. Merrilee SeashoreKuzma's office to call pt.

## 2017-06-23 NOTE — Telephone Encounter (Signed)
New message    Last seen 6/18 [name] Pre-operative Risk Assessment    Request for surgical clearance:  1. What type of surgery is being performed?  Release of A1 pulley rt thumb   2. When is this surgery scheduled? 07/01/17  3. Are there any medications that need to be held prior to surgery and how long? Aspirin and plavix  How long should it be held   4. Practice name and name of physician performing surgery? Dr Fredna Dow    5. What is your office phone and fax number?  [phone] fax [phone]  6. Anesthesia type (None, local, MAC, general) general    Raymond Jordan 06/23/2017, 3:39 PM  _________________________________________________________________   (provider comments below)

## 2017-06-24 ENCOUNTER — Other Ambulatory Visit: Payer: Self-pay

## 2017-06-24 ENCOUNTER — Encounter (HOSPITAL_BASED_OUTPATIENT_CLINIC_OR_DEPARTMENT_OTHER)
Admission: RE | Admit: 2017-06-24 | Discharge: 2017-06-24 | Disposition: A | Discharge status: Home / Self Care | Payer: 59 | Source: Ambulatory Visit | Attending: Orthopedic Surgery | Admitting: Orthopedic Surgery

## 2017-06-24 ENCOUNTER — Encounter (HOSPITAL_BASED_OUTPATIENT_CLINIC_OR_DEPARTMENT_OTHER): Payer: Self-pay | Admitting: *Deleted

## 2017-06-24 DIAGNOSIS — G4733 Obstructive sleep apnea (adult) (pediatric): Secondary | ICD-10-CM | POA: Diagnosis not present

## 2017-06-24 DIAGNOSIS — Z7982 Long term (current) use of aspirin: Secondary | ICD-10-CM | POA: Diagnosis not present

## 2017-06-24 DIAGNOSIS — I10 Essential (primary) hypertension: Secondary | ICD-10-CM | POA: Diagnosis not present

## 2017-06-24 DIAGNOSIS — I2510 Atherosclerotic heart disease of native coronary artery without angina pectoris: Secondary | ICD-10-CM | POA: Diagnosis not present

## 2017-06-24 DIAGNOSIS — Z01812 Encounter for preprocedural laboratory examination: Secondary | ICD-10-CM | POA: Insufficient documentation

## 2017-06-24 DIAGNOSIS — G4730 Sleep apnea, unspecified: Secondary | ICD-10-CM | POA: Insufficient documentation

## 2017-06-24 DIAGNOSIS — J45909 Unspecified asthma, uncomplicated: Secondary | ICD-10-CM | POA: Diagnosis not present

## 2017-06-24 DIAGNOSIS — K219 Gastro-esophageal reflux disease without esophagitis: Secondary | ICD-10-CM | POA: Diagnosis not present

## 2017-06-24 DIAGNOSIS — Z955 Presence of coronary angioplasty implant and graft: Secondary | ICD-10-CM | POA: Diagnosis not present

## 2017-06-24 DIAGNOSIS — E669 Obesity, unspecified: Secondary | ICD-10-CM | POA: Diagnosis not present

## 2017-06-24 DIAGNOSIS — Z8661 Personal history of infections of the central nervous system: Secondary | ICD-10-CM | POA: Diagnosis not present

## 2017-06-24 DIAGNOSIS — M65311 Trigger thumb, right thumb: Secondary | ICD-10-CM | POA: Diagnosis present

## 2017-06-24 DIAGNOSIS — E7800 Pure hypercholesterolemia, unspecified: Secondary | ICD-10-CM | POA: Diagnosis not present

## 2017-06-24 DIAGNOSIS — Z79899 Other long term (current) drug therapy: Secondary | ICD-10-CM | POA: Diagnosis not present

## 2017-06-24 DIAGNOSIS — Z87891 Personal history of nicotine dependence: Secondary | ICD-10-CM | POA: Diagnosis not present

## 2017-06-24 DIAGNOSIS — I252 Old myocardial infarction: Secondary | ICD-10-CM | POA: Diagnosis not present

## 2017-06-24 LAB — BASIC METABOLIC PANEL
ANION GAP: 5 (ref 5–15)
BUN: 16 mg/dL (ref 6–20)
CALCIUM: 9 mg/dL (ref 8.9–10.3)
CO2: 30 mmol/L (ref 22–32)
CREATININE: 1.07 mg/dL (ref 0.61–1.24)
Chloride: 103 mmol/L (ref 101–111)
GFR calc non Af Amer: >60 mL/min (ref >60)
Glucose, Bld: 88 mg/dL (ref 65–99)
Potassium: 4.3 mmol/L (ref 3.5–5.1)
Sodium: 138 mmol/L (ref 135–145)

## 2017-06-24 NOTE — Progress Notes (Signed)
Pre op call completed with patient.  Informed patient he would get a call from Doctor regarding plavix and aspirin use prior to surgery.  If pt has not heard from office prior to Wednesday the 26 he is to call Dr. Merrilee SeashoreKuzma's office.

## 2017-06-25 NOTE — Telephone Encounter (Signed)
Dr. Mayford Knifeurner can pt be off ASA and plavix for 5 days for thumb surgery?  thanks   Surgery is scheduled for the 27th of Dec.

## 2017-06-27 NOTE — Telephone Encounter (Signed)
Yes

## 2017-06-30 ENCOUNTER — Telehealth: Payer: Self-pay | Admitting: Cardiology

## 2017-06-30 NOTE — Telephone Encounter (Signed)
   Primary Cardiologist: No primary care provider on file.  Chart reviewed as part of pre-operative protocol coverage. Patient was contacted 06/30/2017 in reference to pre-operative risk assessment for pending surgery as outlined below.  Raymond BickerHoward N Warga Jr. was last seen on 12/07/2016 by Robbie LisBrittainy Simmons.  Since that day, Raymond BickerHoward N Kilmartin Jr. has done well.  Therefore, based on ACC/AHA guidelines, the patient would be at acceptable risk for the planned procedure without further cardiovascular testing.   I will route this recommendation to the requesting party via Epic fax function and remove from pre-op pool.  Please call with questions.  He needs to hold aspirin and plavix for 5 days prior to the procedure and restart as soon as possible afterward. Since he only began to hold aspirin and plavix this morning and surgery is tomorrow, it is best to delay surgery  Azalee CourseHao Michaeline Eckersley, PA 06/30/2017, 9:10 AM

## 2017-06-30 NOTE — Telephone Encounter (Signed)
New message    Patient calling for status of clearance. Surgery date 12/27.  Please call

## 2017-06-30 NOTE — Telephone Encounter (Signed)
See other note - Raymond BirchHao spoke w patient this AM and updated clearance documentation, which was faxed to Dr. Merrilee SeashoreKuzma's office.

## 2017-06-30 NOTE — Telephone Encounter (Signed)
Faxed to requesting office at provided number. 

## 2017-07-01 ENCOUNTER — Encounter (HOSPITAL_BASED_OUTPATIENT_CLINIC_OR_DEPARTMENT_OTHER)
Admission: RE | Disposition: A | Discharge status: Home / Self Care | Payer: Self-pay | Source: Ambulatory Visit | Attending: Orthopedic Surgery

## 2017-07-01 ENCOUNTER — Other Ambulatory Visit: Payer: Self-pay

## 2017-07-01 ENCOUNTER — Encounter (HOSPITAL_BASED_OUTPATIENT_CLINIC_OR_DEPARTMENT_OTHER): Payer: Self-pay | Admitting: Emergency Medicine

## 2017-07-01 ENCOUNTER — Ambulatory Visit (HOSPITAL_BASED_OUTPATIENT_CLINIC_OR_DEPARTMENT_OTHER): Payer: 59 | Admitting: Certified Registered Nurse Anesthetist

## 2017-07-01 ENCOUNTER — Ambulatory Visit (HOSPITAL_BASED_OUTPATIENT_CLINIC_OR_DEPARTMENT_OTHER)
Admission: RE | Admit: 2017-07-01 | Discharge: 2017-07-01 | Disposition: A | Discharge status: Home / Self Care | Payer: 59 | Source: Ambulatory Visit | Attending: Orthopedic Surgery | Admitting: Orthopedic Surgery

## 2017-07-01 DIAGNOSIS — E7800 Pure hypercholesterolemia, unspecified: Secondary | ICD-10-CM | POA: Insufficient documentation

## 2017-07-01 DIAGNOSIS — I2510 Atherosclerotic heart disease of native coronary artery without angina pectoris: Secondary | ICD-10-CM | POA: Insufficient documentation

## 2017-07-01 DIAGNOSIS — I10 Essential (primary) hypertension: Secondary | ICD-10-CM | POA: Diagnosis not present

## 2017-07-01 DIAGNOSIS — G4733 Obstructive sleep apnea (adult) (pediatric): Secondary | ICD-10-CM | POA: Insufficient documentation

## 2017-07-01 DIAGNOSIS — K219 Gastro-esophageal reflux disease without esophagitis: Secondary | ICD-10-CM | POA: Insufficient documentation

## 2017-07-01 DIAGNOSIS — M65311 Trigger thumb, right thumb: Secondary | ICD-10-CM | POA: Diagnosis not present

## 2017-07-01 DIAGNOSIS — I252 Old myocardial infarction: Secondary | ICD-10-CM | POA: Insufficient documentation

## 2017-07-01 DIAGNOSIS — Z87891 Personal history of nicotine dependence: Secondary | ICD-10-CM | POA: Insufficient documentation

## 2017-07-01 DIAGNOSIS — Z955 Presence of coronary angioplasty implant and graft: Secondary | ICD-10-CM | POA: Insufficient documentation

## 2017-07-01 DIAGNOSIS — Z8661 Personal history of infections of the central nervous system: Secondary | ICD-10-CM | POA: Insufficient documentation

## 2017-07-01 DIAGNOSIS — Z7982 Long term (current) use of aspirin: Secondary | ICD-10-CM | POA: Insufficient documentation

## 2017-07-01 DIAGNOSIS — Z79899 Other long term (current) drug therapy: Secondary | ICD-10-CM | POA: Insufficient documentation

## 2017-07-01 HISTORY — DX: Other intervertebral disc degeneration, lumbar region: M51.36

## 2017-07-01 HISTORY — DX: Other intervertebral disc displacement, lumbar region: M51.26

## 2017-07-01 HISTORY — DX: Gastro-esophageal reflux disease without esophagitis: K21.9

## 2017-07-01 HISTORY — PX: TRIGGER FINGER RELEASE: SHX641

## 2017-07-01 HISTORY — DX: Other intervertebral disc degeneration, lumbar region without mention of lumbar back pain or lower extremity pain: M51.369

## 2017-07-01 SURGERY — RELEASE, A1 PULLEY, FOR TRIGGER FINGER
Anesthesia: Regional | Site: Thumb | Laterality: Right

## 2017-07-01 MED ORDER — BUPIVACAINE HCL (PF) 0.25 % IJ SOLN
INTRAMUSCULAR | Status: DC | PRN
Start: 2017-07-01 — End: 2017-07-01
  Administered 2017-07-01: 5 mL

## 2017-07-01 MED ORDER — HYDROCODONE-ACETAMINOPHEN 5-325 MG PO TABS: ORAL_TABLET | ORAL | 0 refills | Status: DC

## 2017-07-01 MED ORDER — CEFAZOLIN SODIUM-DEXTROSE 2-3 GM-%(50ML) IV SOLR
INTRAVENOUS | Status: DC | PRN
  Administered 2017-07-01: 2 g via INTRAVENOUS

## 2017-07-01 MED ORDER — LACTATED RINGERS IV SOLN
INTRAVENOUS | Status: DC
  Administered 2017-07-01: 09:00:00 via INTRAVENOUS

## 2017-07-01 MED ORDER — PROPOFOL 500 MG/50ML IV EMUL
INTRAVENOUS | Status: DC | PRN
  Administered 2017-07-01: 50 ug/kg/min via INTRAVENOUS

## 2017-07-01 MED ORDER — ONDANSETRON HCL 4 MG/2ML IJ SOLN
INTRAMUSCULAR | Status: AC
  Filled 2017-07-01: qty 2

## 2017-07-01 MED ORDER — LIDOCAINE HCL (PF) 0.5 % IJ SOLN
INTRAMUSCULAR | Status: DC | PRN
  Administered 2017-07-01: 35 mL via INTRAVENOUS

## 2017-07-01 MED ORDER — CHLORHEXIDINE GLUCONATE 4 % EX LIQD: 60.0000 mL | Freq: Once | CUTANEOUS | Status: DC

## 2017-07-01 MED ORDER — FENTANYL CITRATE (PF) 100 MCG/2ML IJ SOLN
50.0000 ug | INTRAMUSCULAR | Status: DC | PRN
  Administered 2017-07-01: 50 ug via INTRAVENOUS

## 2017-07-01 MED ORDER — MIDAZOLAM HCL 2 MG/2ML IJ SOLN
INTRAMUSCULAR | Status: AC
  Filled 2017-07-01: qty 2

## 2017-07-01 MED ORDER — SCOPOLAMINE 1 MG/3DAYS TD PT72: 1.0000 | MEDICATED_PATCH | Freq: Once | TRANSDERMAL | Status: DC | PRN

## 2017-07-01 MED ORDER — CEFAZOLIN SODIUM-DEXTROSE 2-4 GM/100ML-% IV SOLN: 2.0000 g | INTRAVENOUS | Status: DC

## 2017-07-01 MED ORDER — FENTANYL CITRATE (PF) 100 MCG/2ML IJ SOLN
INTRAMUSCULAR | Status: AC
  Filled 2017-07-01: qty 2

## 2017-07-01 MED ORDER — CEFAZOLIN SODIUM-DEXTROSE 2-4 GM/100ML-% IV SOLN
INTRAVENOUS | Status: AC
  Filled 2017-07-01: qty 100

## 2017-07-01 MED ORDER — ONDANSETRON HCL 4 MG/2ML IJ SOLN
INTRAMUSCULAR | Status: DC | PRN
  Administered 2017-07-01: 4 mg via INTRAVENOUS

## 2017-07-01 MED ORDER — MIDAZOLAM HCL 2 MG/2ML IJ SOLN
1.0000 mg | INTRAMUSCULAR | Status: DC | PRN
  Administered 2017-07-01: 2 mg via INTRAVENOUS

## 2017-07-01 SURGICAL SUPPLY — 31 items
BANDAGE COBAN STERILE 2 (GAUZE/BANDAGES/DRESSINGS) ×2 IMPLANT
BLADE SURG 15 STRL LF DISP TIS (BLADE) ×2 IMPLANT
BLADE SURG 15 STRL SS (BLADE) ×2
BNDG ESMARK 4X9 LF (GAUZE/BANDAGES/DRESSINGS) IMPLANT
CHLORAPREP W/TINT 26ML (MISCELLANEOUS) ×2 IMPLANT
CORD BIPOLAR FORCEPS 12FT (ELECTRODE) ×2 IMPLANT
COVER BACK TABLE 60X90IN (DRAPES) ×2 IMPLANT
COVER MAYO STAND STRL (DRAPES) ×2 IMPLANT
CUFF TOURNIQUET SINGLE 18IN (TOURNIQUET CUFF) ×2 IMPLANT
DRAPE EXTREMITY T 121X128X90 (DRAPE) ×2 IMPLANT
DRAPE SURG 17X23 STRL (DRAPES) ×2 IMPLANT
GAUZE SPONGE 4X4 12PLY STRL (GAUZE/BANDAGES/DRESSINGS) ×2 IMPLANT
GAUZE XEROFORM 1X8 LF (GAUZE/BANDAGES/DRESSINGS) ×2 IMPLANT
GLOVE BIO SURGEON STRL SZ 6.5 (GLOVE) ×2 IMPLANT
GLOVE BIO SURGEON STRL SZ7.5 (GLOVE) ×2 IMPLANT
GLOVE BIOGEL PI IND STRL 7.0 (GLOVE) ×2 IMPLANT
GLOVE BIOGEL PI IND STRL 8 (GLOVE) ×1 IMPLANT
GLOVE BIOGEL PI INDICATOR 7.0 (GLOVE) ×2
GLOVE BIOGEL PI INDICATOR 8 (GLOVE) ×1
GOWN STRL REUS W/ TWL LRG LVL3 (GOWN DISPOSABLE) ×1 IMPLANT
GOWN STRL REUS W/TWL LRG LVL3 (GOWN DISPOSABLE) ×1
GOWN STRL REUS W/TWL XL LVL3 (GOWN DISPOSABLE) ×2 IMPLANT
NEEDLE HYPO 25X1 1.5 SAFETY (NEEDLE) ×2 IMPLANT
NS IRRIG 1000ML POUR BTL (IV SOLUTION) ×2 IMPLANT
PACK BASIN DAY SURGERY FS (CUSTOM PROCEDURE TRAY) ×2 IMPLANT
STOCKINETTE 4X48 STRL (DRAPES) ×2 IMPLANT
SUT ETHILON 4 0 PS 2 18 (SUTURE) ×2 IMPLANT
SYR BULB 3OZ (MISCELLANEOUS) ×2 IMPLANT
SYR CONTROL 10ML LL (SYRINGE) ×2 IMPLANT
TOWEL OR 17X24 6PK STRL BLUE (TOWEL DISPOSABLE) ×2 IMPLANT
UNDERPAD 30X30 (UNDERPADS AND DIAPERS) ×2 IMPLANT

## 2017-07-01 NOTE — H&P (Signed)
Raymond BickerHoward N Tobon Jr. is an 56 y.o. male.   Chief Complaint: right thumb trigger digit HPI: 56 yo male with triggering right thumb.  This has progressed to the point that he cannot extend the IP joint of the thumb.  Had improvement initially with injections.  He wishes to have a trigger release.  Allergies:  Allergies  Allergen Reactions  . Crestor [Rosuvastatin]     Muscle aches & cough  . Lipitor [Atorvastatin]     Muscle aches & cough    Past Medical History:  Diagnosis Date  . Allergic rhinitis    /asthma  . Arthritis    "shoulders, back, knees" (09/16/2015)  . Bulging of intervertebral disc between L4 and L5   . CAD (coronary artery disease), native coronary artery 09/15/2015   s/p NSTEMI 09/2015 with cath showing 99% RCA s/p DES now on DAPT  . Childhood asthma   . ED (erectile dysfunction)   . GERD (gastroesophageal reflux disease)    after taking meds , not treated at present  . Glaucoma   . Hypercholesteremia   . Hyperlipidemia LDL goal <70   . Hypertension   . Meningitis hospitalized 07/2009   rule out bells palsy ruled out menigitis  . Obstructive sleep apnea on CPAP   . Osteoarthritis    OA knees, shoulders; DDD lumbar spine  . Seasonal allergies     Past Surgical History:  Procedure Laterality Date  . ANKLE SURGERY  1985   bone spur removal ankle.  Marland Kitchen. CARDIAC CATHETERIZATION N/A 09/16/2015   Procedure: Left Heart Cath and Coronary Angiography;  Surgeon: Kathleene Hazelhristopher D McAlhany, MD;  [facility];  Service: Cardiovascular;  Laterality: N/A;  . CARDIAC CATHETERIZATION N/A 09/16/2015   Procedure: Coronary Stent Intervention;  Surgeon: Kathleene Hazelhristopher D McAlhany, MD;  Location: [facility] INVASIVE CV LAB;  Service: Cardiovascular;  Laterality: N/A;  . MYRINGOTOMY WITH TUBE PLACEMENT Left ~ 07/2009  . NASAL SINUS SURGERY  ~ 2007   "& fixed my septum"  . SHOULDER OPEN ROTATOR CUFF REPAIR Right 10/2013  . VASECTOMY    . WISDOM TOOTH EXTRACTION  ~ 281992    Family  History: Family History  Problem Relation Age of Onset  . Asthma Mother        sarcoidosis also  . Diabetes Mother   . Stroke Mother   . Sarcoidosis Mother   . Stroke Father   . Cancer Father        R breast cancer  . Heart disease Father        AMI/CAD with stenting  . Arthritis Father   . Heart attack Father     Social History:   reports that he has quit smoking. he has never used smokeless tobacco. He reports that he drinks alcohol. He reports that he does not use drugs.  Medications: Medications Prior to Admission  Medication Sig Dispense Refill  . aspirin EC 81 MG tablet Take 81 mg by mouth daily.    . B Complex-Biotin-FA (SUPER B-COMPLEX) TABS Take 1 tablet by mouth daily.    . clopidogrel (PLAVIX) 75 MG tablet TAKE 1 TABLET BY MOUTH EVERY DAY 90 tablet 2  . ezetimibe (ZETIA) 10 MG tablet Take 1 tablet (10 mg total) by mouth daily. 90 tablet 3  . hydrochlorothiazide (HYDRODIURIL) 25 MG tablet Take 25 mg by mouth daily.    Marland Kitchen. latanoprost (XALATAN) 0.005 % ophthalmic solution Place 1 drop into both eyes at bedtime.    . metoprolol tartrate (  LOPRESSOR) 25 MG tablet Take 1 tablet (25 mg total) by mouth 2 (two) times daily. 28 tablet 0  . metoprolol tartrate (LOPRESSOR) 25 MG tablet Take 1 tablet (25 mg total) by mouth 2 (two) times daily. 180 tablet 3  . pravastatin (PRAVACHOL) 80 MG tablet Take 1 tablet (80 mg total) by mouth daily. 90 tablet 2  . timolol (TIMOPTIC) 0.5 % ophthalmic solution Place 1 drop into both eyes every morning.  12  . cetirizine (ZYRTEC) 10 MG tablet Take 10 mg by mouth daily as needed for allergies.     . fluticasone (FLONASE) 50 MCG/ACT nasal spray Place 2 sprays into both nostrils as needed for allergies or rhinitis.    . Investigational - Study Medication Take 1 tablet by mouth 2 (two) times daily. Study name: twilight study Additional study details: Plavix 75 mcg    . nitroGLYCERIN (NITROSTAT) 0.4 MG SL tablet PLACE 1 TABLET (0.4 MG TOTAL) UNDER THE  TONGUE EVERY 5 (FIVE) MINUTES AS NEEDED FOR CHEST PAIN. 25 tablet 5  . VENTOLIN HFA 108 (90 Base) MCG/ACT inhaler Inhale 2 puffs into the lungs every 4 (four) hours as needed. Shortness of breath or wheezing  1    No results found for this or any previous visit (from the past 48 hour(s)).  No results found.   A comprehensive review of systems was negative.  Blood pressure 129/79, pulse (!) 55, temperature 98.2 F (36.8 C), temperature source Oral, resp. rate 18, height 5\' 11"  (1.803 m), weight 122 kg (269 lb), SpO2 100 %.  General appearance: alert, cooperative and appears stated age Head: Normocephalic, without obvious abnormality, atraumatic Neck: supple, symmetrical, trachea midline Resp: clear to auscultation bilaterally Cardio: regular rate and rhythm GI: non-tender Extremities: Intact sensation and capillary refill all digits.  +epl/fpl/io.  No wounds.  Pulses: 2+ and symmetric Skin: Skin color, texture, turgor normal. No rashes or lesions Neurologic: Grossly normal Incision/Wound:none  Assessment/Plan Right thumb trigger digit.  Non operative and operative treatment options were discussed with the patient and patient wishes to proceed with operative treatment. Risks, benefits, and alternatives of surgery were discussed and the patient agrees with the plan of care.   Tina Gruner R 07/01/2017, 9:18 AM

## 2017-07-01 NOTE — Anesthesia Postprocedure Evaluation (Signed)
Anesthesia Post Note  Patient: Raymond BickerHoward N Montelongo Jr.  Procedure(s) Performed: RELEASE TRIGGER FINGER/A-1 PULLEY RIGHT THUMB (Right Thumb)     Patient location during evaluation: PACU Anesthesia Type: Bier Block Level of consciousness: awake and alert Pain management: pain level controlled Vital Signs Assessment: post-procedure vital signs reviewed and stable Respiratory status: spontaneous breathing, nonlabored ventilation and respiratory function stable Cardiovascular status: blood pressure returned to baseline and stable Postop Assessment: no apparent nausea or vomiting Anesthetic complications: no    Last Vitals:  Vitals:   07/01/17 1015 07/01/17 1044  BP: 116/80 134/81  Pulse: 65 (!) 53  Resp: 17 18  Temp:  36.5 C  SpO2: 100% 100%    Last Pain:  Vitals:   07/01/17 1044  TempSrc:   PainSc: 0-No pain                 Lowella CurbWarren Ray Janelly Switalski

## 2017-07-01 NOTE — Transfer of Care (Signed)
Immediate Anesthesia Transfer of Care Note  Patient: Raymond Jordan.  Procedure(s) Performed: RELEASE TRIGGER FINGER/A-1 PULLEY RIGHT THUMB (Right Thumb)  Patient Location: PACU  Anesthesia Type:MAC and Bier block  Level of Consciousness: awake, alert  and oriented  Airway & Oxygen Therapy: Patient Spontanous Breathing and Patient connected to face mask oxygen  Post-op Assessment: Report given to RN and Post -op Vital signs reviewed and stable  Post vital signs: Reviewed and stable  Last Vitals:  Vitals:   07/01/17 0850  BP: 129/79  Pulse: (!) 55  Resp: 18  Temp: 36.8 C  SpO2: 100%    Last Pain:  Vitals:   07/01/17 0850  TempSrc: Oral         Complications: No apparent anesthesia complications

## 2017-07-01 NOTE — Anesthesia Preprocedure Evaluation (Signed)
Anesthesia Evaluation  Patient identified by MRN, date of birth, ID band Patient awake    Reviewed: Allergy & Precautions, NPO status , Patient's Chart, lab work & pertinent test results, reviewed documented beta blocker date and time   Airway Mallampati: II  TM Distance: >3 FB Neck ROM: Full    Dental no notable dental hx.    Pulmonary neg pulmonary ROS, asthma , sleep apnea , former smoker,    Pulmonary exam normal breath sounds clear to auscultation       Cardiovascular hypertension, Pt. on medications and Pt. on home beta blockers + CAD and + Past MI  negative cardio ROS Normal cardiovascular exam Rhythm:Regular Rate:Normal     Neuro/Psych negative neurological ROS  negative psych ROS   GI/Hepatic negative GI ROS, Neg liver ROS, GERD  ,  Endo/Other  negative endocrine ROS  Renal/GU negative Renal ROS  negative genitourinary   Musculoskeletal negative musculoskeletal ROS (+) Arthritis , Osteoarthritis,    Abdominal (+) + obese,   Peds negative pediatric ROS (+)  Hematology negative hematology ROS (+)   Anesthesia Other Findings   Reproductive/Obstetrics negative OB ROS                             Anesthesia Physical Anesthesia Plan  ASA: III  Anesthesia Plan: Bier Block and Bier Block-LIDOCAINE ONLY   Post-op Pain Management:    Induction: Intravenous  PONV Risk Score and Plan: 1 and Ondansetron  Airway Management Planned: Simple Face Mask  Additional Equipment:   Intra-op Plan:   Post-operative Plan:   Informed Consent: I have reviewed the patients History and Physical, chart, labs and discussed the procedure including the risks, benefits and alternatives for the proposed anesthesia with the patient or authorized representative who has indicated his/her understanding and acceptance.   Dental advisory given  Plan Discussed with: CRNA  Anesthesia Plan Comments:          Anesthesia Quick Evaluation

## 2017-07-01 NOTE — Op Note (Signed)
07/01/2017 [facility] OPERATIVE REPORT   PREOPERATIVE DIAGNOSIS: Right trigger thumb.  POSTOPERATIVE DIAGNOSIS:  Right trigger thumb.  PROCEDURE: Right trigger thumb release.  SURGEON:  Betha LoaKevin Manpreet Strey, MD  ASSISTANT:  None.  ANESTHESIA:  Bier block and sedation.  IV FLUIDS:  Per anesthesia flow sheet.  ESTIMATED BLOOD LOSS:  Minimal.  COMPLICATIONS:  None.  SPECIMENS:  None.  TOURNIQUET TIME:  Total Tourniquet Time Documented: Forearm (Right) - 20 minutes Total: Forearm (Right) - 20 minutes   DISPOSITION:  Stable to PACU.  LOCATION: [facility]  INDICATIONS: Raymond BickerHoward N Mccalip Jr. is a 56 y.o. male with triggering right thumb.  This has been injected twice.  It has progressed to the point that he cannot extend the IP joint.  He wishes to have a trigger release.  Risks, benefits and alternatives of surgery were discussed including the risk of blood loss, infection, damage to nerves, vessels, tendons, ligaments, bone, failure of surgery, need for additional surgery, complications with wound healing, continued pain, continued triggering and need for repeat surgery.  He voiced understanding of these risks and elected to proceed.  OPERATIVE COURSE:  After being identified preoperatively by myself, the patient and I agreed upon the procedure and site of procedure.  The surgical site was marked. The risks, benefits, and alternatives of surgery were reviewed and he wished to proceed.  Surgical consent had been signed. He was given IV Ancef as preoperative antibiotic prophylaxis. He was transported to the operating room and placed on the operating room table in supine position with the Right upper extremity on an arm board. Bier block and sedation was induced by the anesthesiologist.  The Right upper extremity was prepped and draped in normal sterile orthopedic fashion. A surgical pause was performed between surgeons, anesthesia, and operating room staff, and all  were in agreement as to the patient, procedure, and site of procedure.  Tourniquet at the proximal aspect of the forearm had been inflated for the Bier block.   An incision was made transversely at the MP flexion crease of the thumb.  This was made through the skin only.  Spreading technique was used.  The radial and ulnar digital nerves were identified and were protected throughout the case. The flexor sheath was identified.  The A1 pulley was identified.  It was sharply incised.  It was released in its entirety.  Care was taken to avoid any release of the oblique pulley. The thumb was placed through a range of motion, there was noted to be no catching.  The wound was copiously irrigated with sterile saline. It was then closed with 4-0 nylon in a horizontal mattress fashion.  The wound was injected with  0.25% plain Marcaine to aid in postoperative analgesia.  It was then dressed with sterile Xeroform, 4x4s, and wrapped lightly with a Coban dressing.  Tourniquet was deflated at 20 minutes.  The fingertips were pink with brisk capillary refill after deflation of the tourniquet.  The operative drapes were broken down and the patient was awoken from anesthesia safely.  He was transferred back to the stretcher and taken to the PACU in stable condition.   I will see him back in the office in 1 week for postoperative followup.  I will give him a prescription for norco 5/325 1-2 tabs po q6 hours prn pain, dispense #20.    Tami RibasKUZMA,Trenee Igoe R, MD Electronically signed, 07/01/17

## 2017-07-01 NOTE — Discharge Instructions (Addendum)

## 2017-07-01 NOTE — Anesthesia Procedure Notes (Signed)
Anesthesia Regional Block: Bier block (IV Regional)   Pre-Anesthetic Checklist: ,, timeout performed, Correct Patient, Correct Site, Correct Laterality, Correct Procedure, Correct Position, site marked, Risks and benefits discussed, Surgical consent,  Pre-op evaluation,  At surgeon's request  Laterality: Right  Prep: alcohol swabs        Procedures:,,,,,,,, #20gu IV placed  Narrative:  CRNA: Sophee Mckimmy M, CRNA      

## 2017-07-01 NOTE — Brief Op Note (Signed)
07/01/2017  10:04 AM  PATIENT:  Raymond BickerHoward N Friedel Jr.  56 y.o. male  PRE-OPERATIVE DIAGNOSIS:  CHRONIC TRIGGER RIGHT THUMB  POST-OPERATIVE DIAGNOSIS:  CHRONIC TRIGGER RIGHT THUMB  PROCEDURE:  Procedure(s): RELEASE TRIGGER FINGER/A-1 PULLEY RIGHT THUMB (Right)  SURGEON:  Surgeon(s) and Role:    * Betha LoaKuzma, Jeriann Sayres, MD - Primary  PHYSICIAN ASSISTANT:   ASSISTANTS: none   ANESTHESIA:   IV sedation and Bier block  EBL:  5 mL   BLOOD ADMINISTERED:none  DRAINS: none   LOCAL MEDICATIONS USED:  MARCAINE     SPECIMEN:  No Specimen  DISPOSITION OF SPECIMEN:  N/A  COUNTS:  YES  TOURNIQUET:   Total Tourniquet Time Documented: Forearm (Right) - 20 minutes Total: Forearm (Right) - 20 minutes   DICTATION: .Note written in EPIC  PLAN OF CARE: Discharge to home after PACU  PATIENT DISPOSITION:  PACU - hemodynamically stable.

## 2017-07-02 ENCOUNTER — Encounter (HOSPITAL_BASED_OUTPATIENT_CLINIC_OR_DEPARTMENT_OTHER): Payer: Self-pay | Admitting: Orthopedic Surgery

## 2017-07-02 DIAGNOSIS — H401111 Primary open-angle glaucoma, right eye, mild stage: Secondary | ICD-10-CM | POA: Diagnosis not present

## 2017-07-02 DIAGNOSIS — H52203 Unspecified astigmatism, bilateral: Secondary | ICD-10-CM | POA: Diagnosis not present

## 2017-07-02 DIAGNOSIS — H401122 Primary open-angle glaucoma, left eye, moderate stage: Secondary | ICD-10-CM | POA: Diagnosis not present

## 2017-07-02 DIAGNOSIS — H524 Presbyopia: Secondary | ICD-10-CM | POA: Diagnosis not present

## 2017-07-07 ENCOUNTER — Other Ambulatory Visit: Payer: Self-pay | Admitting: Cardiology

## 2017-07-07 DIAGNOSIS — E7800 Pure hypercholesterolemia, unspecified: Secondary | ICD-10-CM

## 2017-07-21 DIAGNOSIS — H1851 Endothelial corneal dystrophy: Secondary | ICD-10-CM | POA: Diagnosis not present

## 2017-08-26 DIAGNOSIS — J301 Allergic rhinitis due to pollen: Secondary | ICD-10-CM | POA: Diagnosis not present

## 2017-09-27 ENCOUNTER — Other Ambulatory Visit: Payer: Self-pay | Admitting: Cardiology

## 2017-09-27 DIAGNOSIS — G4733 Obstructive sleep apnea (adult) (pediatric): Secondary | ICD-10-CM | POA: Diagnosis not present

## 2017-11-04 ENCOUNTER — Other Ambulatory Visit: Payer: Self-pay | Admitting: Cardiology

## 2017-11-26 DIAGNOSIS — H401111 Primary open-angle glaucoma, right eye, mild stage: Secondary | ICD-10-CM | POA: Diagnosis not present

## 2017-11-26 DIAGNOSIS — H401122 Primary open-angle glaucoma, left eye, moderate stage: Secondary | ICD-10-CM | POA: Diagnosis not present

## 2017-11-27 DIAGNOSIS — J0100 Acute maxillary sinusitis, unspecified: Secondary | ICD-10-CM | POA: Diagnosis not present

## 2017-11-27 DIAGNOSIS — R05 Cough: Secondary | ICD-10-CM | POA: Diagnosis not present

## 2017-12-06 ENCOUNTER — Telehealth: Payer: Self-pay | Admitting: Cardiology

## 2017-12-06 NOTE — Telephone Encounter (Signed)
New message   Patient requesting letter of compliance for DOT.  Patient also calling regarding coughing episodes for 2 months, states he has not taken  meds in 2 days.   Pt c/o medication issue:  1. Name of Medication: pravastatin (PRAVACHOL) 80 MG tablet and ezetimibe (ZETIA) 10 MG tablet  2. How are you currently taking this medication (dosage and times per day)?   3. Are you having a reaction (difficulty breathing--STAT)? NO   4. What is your medication issue? Patient concerned about side effects

## 2017-12-06 NOTE — Telephone Encounter (Signed)
Pt states he is having coughing spells. He states it happens at night when he lays down at night, he coughs up white thick sputum. Pt seen at [facility] walk in clinic 2 weeks ago and was diagnosed with sinus infection, pt has completed his antibiotics and he is still having the cough,  He feels like its the same cough he had when he was on Lipitor and Crestor. He states he has stop taking the pravastatin and the Zetia on Saturday and he feels the cough has improved.  He also called his primary MD to follow up. I informed pt to hold meds for a week and call back with an update. Will forwarded to Dr. Mayford Knifeurner for further recommendations.    Pt also requesting a letter of compliance for DOT for his cpap. Pt is a Naval architecttruck driver.

## 2017-12-07 NOTE — Telephone Encounter (Signed)
I cannot give him DOT clearance without a follow-up office visit.  In regards to the cough he needs to follow back up with his PCP.  The statins are not common and causing cough.

## 2017-12-07 NOTE — Telephone Encounter (Signed)
Pt states he has a follow up appt with his primary MD on Thursday. I informed pt to restart statin medication as symptoms are not common to statin therapy.  Pt also aware that he needs an OV for DOT letter. Pt states his card expires on 01/02/18. Pt scheduled for OV 02/21/18 (first available). Pt would like to know if he could get letter until his OV. I informed pt I would forward to Dr. Mayford Knifeurner for review. He stated understanding and thankful for the call

## 2017-12-08 NOTE — Telephone Encounter (Signed)
I confirmed with Raymond MatesDanielle Jordan okay to schedule pt with [name] FiscalLori, NP on 12/29/17 for DOT clearance letter. Pt made aware, he states understanding and thankful for the call

## 2017-12-08 NOTE — Telephone Encounter (Signed)
Please see if you can get him in with extender earlier

## 2017-12-09 ENCOUNTER — Other Ambulatory Visit: Payer: Self-pay | Admitting: Cardiology

## 2017-12-09 DIAGNOSIS — J329 Chronic sinusitis, unspecified: Secondary | ICD-10-CM | POA: Diagnosis not present

## 2017-12-09 DIAGNOSIS — M549 Dorsalgia, unspecified: Secondary | ICD-10-CM | POA: Diagnosis not present

## 2017-12-15 ENCOUNTER — Telehealth: Payer: Self-pay | Admitting: Cardiology

## 2017-12-15 NOTE — Telephone Encounter (Signed)
That is fine 

## 2017-12-15 NOTE — Telephone Encounter (Signed)
New Message:      Pt is calling and states he needs his letter of compliance for his Cpap for his DOT before his appt on 12/29/17. Pt states his DOT expires on 01/02/18 and needs to send it to [facility].

## 2017-12-15 NOTE — Telephone Encounter (Signed)
Pt states he will need to send compliance letter for cpap, complete his physical and [facility] to 01[facility]02/18. Pt is scheduled with Norma FredricksonLori Gerhardt on 12/29/17. Pt requesting a sooner appt as he feels he will miss the deadline to submit all his information.   Would you like me to add him to one of your 24-hour slots?

## 2017-12-16 NOTE — Telephone Encounter (Signed)
I called and offered pt an appt for 12/2017 at 3PM. Pt states he will be out of town for the whole week, rescheduled pt with Dr. Mayford Knifeurner on 12/29/17 at 11:40AM. He states understanding and thankful for the call

## 2017-12-19 ENCOUNTER — Other Ambulatory Visit: Payer: Self-pay | Admitting: Cardiology

## 2017-12-29 ENCOUNTER — Encounter: Payer: Self-pay | Admitting: Cardiology

## 2017-12-29 ENCOUNTER — Other Ambulatory Visit: Payer: Self-pay

## 2017-12-29 ENCOUNTER — Encounter: Payer: Self-pay | Admitting: Physician Assistant

## 2017-12-29 ENCOUNTER — Ambulatory Visit: Payer: 59 | Admitting: Cardiology

## 2017-12-29 ENCOUNTER — Ambulatory Visit: Payer: Self-pay | Admitting: Physician Assistant

## 2017-12-29 ENCOUNTER — Ambulatory Visit: Payer: 59 | Admitting: Nurse Practitioner

## 2017-12-29 VITALS — BP 119/84 | HR 83 | Temp 98.9°F | Ht 71.0 in | Wt 264.2 lb

## 2017-12-29 VITALS — BP 118/62 | HR 80 | Ht 71.0 in | Wt 264.0 lb

## 2017-12-29 DIAGNOSIS — E7800 Pure hypercholesterolemia, unspecified: Secondary | ICD-10-CM | POA: Diagnosis not present

## 2017-12-29 DIAGNOSIS — Z9989 Dependence on other enabling machines and devices: Secondary | ICD-10-CM | POA: Diagnosis not present

## 2017-12-29 DIAGNOSIS — I10 Essential (primary) hypertension: Secondary | ICD-10-CM | POA: Diagnosis not present

## 2017-12-29 DIAGNOSIS — G4733 Obstructive sleep apnea (adult) (pediatric): Secondary | ICD-10-CM | POA: Diagnosis not present

## 2017-12-29 DIAGNOSIS — Z0289 Encounter for other administrative examinations: Secondary | ICD-10-CM

## 2017-12-29 DIAGNOSIS — I2510 Atherosclerotic heart disease of native coronary artery without angina pectoris: Secondary | ICD-10-CM

## 2017-12-29 LAB — BASIC METABOLIC PANEL
BUN/Creatinine Ratio: 11 (ref 9–20)
BUN: 17 mg/dL (ref 6–24)
CO2: 25 mmol/L (ref 20–29)
Calcium: 9 mg/dL (ref 8.7–10.2)
Chloride: 101 mmol/L (ref 96–106)
Creatinine, Ser: 1.55 mg/dL — ABNORMAL HIGH (ref 0.76–1.27)
GFR calc Af Amer: 58 mL/min/{1.73_m2} — ABNORMAL LOW (ref >59)
GFR calc non Af Amer: 50 mL/min/{1.73_m2} — ABNORMAL LOW (ref >59)
GLUCOSE: 89 mg/dL (ref 65–99)
POTASSIUM: 3.6 mmol/L (ref 3.5–5.2)
SODIUM: 140 mmol/L (ref 134–144)

## 2017-12-29 LAB — LIPID PANEL
CHOLESTEROL TOTAL: 133 mg/dL (ref 100–199)
Chol/HDL Ratio: 3 ratio (ref 0.0–5.0)
HDL: 45 mg/dL (ref >39)
LDL Calculated: 73 mg/dL (ref 0–99)
TRIGLYCERIDES: 76 mg/dL (ref 0–149)
VLDL Cholesterol Cal: 15 mg/dL (ref 5–40)

## 2017-12-29 LAB — HEPATIC FUNCTION PANEL
ALBUMIN: 4.4 g/dL (ref 3.5–5.5)
ALT: 33 IU/L (ref 0–44)
AST: 41 IU/L — AB (ref 0–40)
Alkaline Phosphatase: 63 IU/L (ref 39–117)
BILIRUBIN TOTAL: 0.5 mg/dL (ref 0.0–1.2)
BILIRUBIN, DIRECT: 0.15 mg/dL (ref 0.00–0.40)
Total Protein: 7.8 g/dL (ref 6.0–8.5)

## 2017-12-29 NOTE — Patient Instructions (Signed)
Medication Instructions:  Your physician recommends that you continue on your current medications as directed. Please refer to the Current Medication list given to you today.  Labwork: Today for kidney function, liver function, and fasting lipids  Testing/Procedures: None ordered   Follow-Up: Your physician wants you to follow-up in: 1 year with Dr. Turner. You will receive a reminder letter in the mail two months in advance. If you don't receive a letter, please call our office to schedule the follow-up appointment.  Any Other Special Instructions Will Be Listed Below (If Applicable).  Thank you for choosing [facility]    Rena Alajiah Dutkiewicz, RN  [phone]   If you need a refill on your cardiac medications before your next appointment, please call your pharmacy.   

## 2017-12-29 NOTE — Progress Notes (Signed)
   Commercial Driver Medical Examination   Raymond BickerHoward N Stennis Jr. is a 57 y.o. male with a pertinent medical history of status post MI followed closely by cardiology presents today for a commercial driver fitness determination physical exam.  His last stress test was on 01/28/2017 and normal.  The patient reports no problems today. In the past the patient reports receiving 1 year certificates. He denies focal neurological deficits, vision and hearing changes.  He takes medication as prescribed.  He denies the habitual use of benzodiazepines, opioids, amphetamines and denies illicit drug use.   Current medications, family history, allergies, social history reviewed by me and exist elsewhere in the encounter.   Review of Systems  Constitutional: Negative for chills, diaphoresis and fever.  Eyes: Negative.   Respiratory: Negative for cough, hemoptysis, sputum production, shortness of breath and wheezing.   Cardiovascular: Negative for chest pain, orthopnea and leg swelling.  Gastrointestinal: Negative for nausea.  Skin: Negative for rash.  Neurological: Negative for dizziness, sensory change, speech change, focal weakness and headaches.    Objective:     Vision/hearing:  Visual Acuity Screening   Right eye Left eye Both eyes  Without correction:     With correction: 20/20 20/20 20/20  -1  Comments: Peripheral Vision: Right eye 85 degrees. Left eye 85 degrees. The patient can distinguish the colors red, amber and green.  Hearing Screening Comments: The patient was able to hear a forced whisper from 10 feet.  Applicant can recognize and distinguish among traffic control signals and devices showing standard red, green, and amber colors.  Corrective lenses required: Yes  Monocular Vision?: No  Hearing aid requirement: No  Physical Exam  BP 119/84 (BP Location: Right Arm, Patient Position: Sitting, Cuff Size: Large)   Pulse 83   Temp 98.9 F (37.2 C) (Oral)   Ht 5\' 11"  (1.803 m)    Wt 264 lb 3.2 oz (119.8 kg)   SpO2 97%   BMI 36.85 kg/m   Labs: Urine negative for hematuria, glucosuria, proteinuria, specific gravity within normal limits per long form      Assessment:    Healthy male exam.  Meets standards, but periodic monitoring required due to 1.  Driver qualified only for 1 year.    Plan:    Medical examiners certificate completed and printed. Return as needed.    Deliah BostonMichael Zenon Leaf, MS, PA-C 4:09 PM, 12/29/2017

## 2017-12-29 NOTE — Patient Instructions (Signed)
     IF you received an x-ray today, you will receive an invoice from [facility]. Please contact [facility] at [phone] with questions or concerns regarding your invoice.   IF you received labwork today, you will receive an invoice from [facility]. Please contact [facility] at [phone] with questions or concerns regarding your invoice.   Our billing staff will not be able to assist you with questions regarding bills from these companies.  You will be contacted with the lab results as soon as they are available. The fastest way to get your results is to activate your My Chart account. Instructions are located on the last page of this paperwork. If you have not heard from us regarding the results in 2 weeks, please contact this office.     

## 2017-12-29 NOTE — Progress Notes (Signed)
Cardiology Office Note:    Date:  12/29/2017   ID:  Raymond BickerHoward N Wycoff Jr., DOB 1960-11-29, MRN [id number]  PCP:  Shaune PollackGates, Donna, MD  Cardiologist:  No primary care provider on file.    Referring MD: Shaune PollackGates, Donna, MD   Chief Complaint  Patient presents with  . Coronary Artery Disease  . Hypertension  . Hyperlipidemia  . Sleep Apnea    History of Present Illness:    Raymond BickerHoward N Goers Jr. is a 57 y.o. male with a hx of HTN, OSA, obesity, ASCAD s/p non-STEMI with cath showing 99% prox RCA s/p DES (now on DAPT) with normal LVF who presents today for followup.   He denies any chest pain or pressure, SOB, DOE, PND, orthopnea, LE edema, dizziness, palpitations or syncope. He is compliant with his meds and is tolerating meds with no SE.  He is doing well with his CPAP device and thinks that he has gotten used to it.  He tolerates the mask and feels the pressure is adequate.  Since going on CPAP he feels rested in the am and has no significant daytime sleepiness.  He denies any significant mouth or nasal dryness or nasal congestion.  He does not think that he snores.    Past Medical History:  Diagnosis Date  . Allergic rhinitis    /asthma  . Arthritis    "shoulders, back, knees" (09/16/2015)  . Bulging of intervertebral disc between L4 and L5   . CAD (coronary artery disease), native coronary artery 09/15/2015   s/p NSTEMI 09/2015 with cath showing 99% RCA s/p DES now on DAPT  . Childhood asthma   . ED (erectile dysfunction)   . Essential hypertension, benign 12/19/2012  . GERD (gastroesophageal reflux disease)    after taking meds , not treated at present  . Glaucoma   . Health examination of defined subpopulation 12/19/2012  . Hyperlipidemia LDL goal <70   . Meningitis hospitalized 07/2009   rule out bells palsy ruled out menigitis  . Obesity 06/26/2013  . OSA on CPAP 12/19/2012  . Osteoarthritis    OA knees, shoulders; DDD lumbar spine  . Pure hypercholesterolemia 12/19/2012  . Seasonal  allergies     Past Surgical History:  Procedure Laterality Date  . ANKLE SURGERY  1985   bone spur removal ankle.  Marland Kitchen. CARDIAC CATHETERIZATION N/A 09/16/2015   Procedure: Left Heart Cath and Coronary Angiography;  Surgeon: Kathleene Hazelhristopher D McAlhany, MD;  [facility];  Service: Cardiovascular;  Laterality: N/A;  . CARDIAC CATHETERIZATION N/A 09/16/2015   Procedure: Coronary Stent Intervention;  Surgeon: Kathleene Hazelhristopher D McAlhany, MD;  Location: [facility] INVASIVE CV LAB;  Service: Cardiovascular;  Laterality: N/A;  . MYRINGOTOMY WITH TUBE PLACEMENT Left ~ 07/2009  . NASAL SINUS SURGERY  ~ 2007   "& fixed my septum"  . SHOULDER OPEN ROTATOR CUFF REPAIR Right 10/2013  . TRIGGER FINGER RELEASE Right 07/01/2017   Procedure: RELEASE TRIGGER FINGER/A-1 PULLEY RIGHT THUMB;  Surgeon: Betha LoaKuzma, Kevin, MD;  Location: [facility];  Service: Orthopedics;  Laterality: Right;  Marland Kitchen. VASECTOMY    . WISDOM TOOTH EXTRACTION  ~ 1992    Current Medications: Current Meds  Medication Sig  . acetaminophen (TYLENOL) 500 MG tablet Take 500 mg by mouth every 6 (six) hours as needed.  Marland Kitchen. aspirin EC 81 MG tablet Take 81 mg by mouth daily.  . B Complex-Biotin-FA (SUPER B-COMPLEX) TABS Take 1 tablet by mouth daily.  . cetirizine (ZYRTEC) 10 MG tablet  Take 10 mg by mouth daily as needed for allergies.   Marland Kitchen clopidogrel (PLAVIX) 75 MG tablet Take 1 tablet (75 mg total) by mouth daily. Please make yearly appt with Dr. Mayford Knife for June before anymore refills. 1st attempt  . ezetimibe (ZETIA) 10 MG tablet Take 1 tablet (10 mg total) by mouth daily.  . hydrochlorothiazide (HYDRODIURIL) 25 MG tablet Take 25 mg by mouth daily.  Marland Kitchen latanoprost (XALATAN) 0.005 % ophthalmic solution Place 1 drop into both eyes at bedtime.  . metoprolol tartrate (LOPRESSOR) 25 MG tablet TAKE 1 TABLET BY MOUTH TWO  TIMES DAILY  . nitroGLYCERIN (NITROSTAT) 0.4 MG SL tablet PLACE 1 TABLET (0.4 MG TOTAL) UNDER THE TONGUE EVERY 5 (FIVE) MINUTES  AS NEEDED FOR CHEST PAIN.  Marland Kitchen pravastatin (PRAVACHOL) 80 MG tablet TAKE 1 TABLET BY MOUTH  DAILY  . timolol (TIMOPTIC) 0.5 % ophthalmic solution Place 1 drop into both eyes every morning.  . VENTOLIN HFA 108 (90 Base) MCG/ACT inhaler Inhale 2 puffs into the lungs every 4 (four) hours as needed. Shortness of breath or wheezing     Allergies:   Atorvastatin and Rosuvastatin   Social History   Socioeconomic History  . Marital status: Married    Spouse name: Not on file  . Number of children: 2  . Years of education: Not on file  . Highest education level: Not on file  Occupational History  . Occupation: Truck Runner, broadcasting/film/video  . Financial resource strain: Not on file  . Food insecurity:    Worry: Not on file    Inability: Not on file  . Transportation needs:    Medical: Not on file    Non-medical: Not on file  Tobacco Use  . Smoking status: Former Games developer  . Smokeless tobacco: Never Used  . Tobacco comment: "smoked 3 packs of cigaretes in my whole life"  Substance and Sexual Activity  . Alcohol use: Yes    Comment: 09/16/2015 "might have a couple drinks q couple months"  . Drug use: No  . Sexual activity: Yes  Lifestyle  . Physical activity:    Days per week: Not on file    Minutes per session: Not on file  . Stress: Not on file  Relationships  . Social connections:    Talks on phone: Not on file    Gets together: Not on file    Attends religious service: Not on file    Active member of club or organization: Not on file    Attends meetings of clubs or organizations: Not on file    Relationship status: Not on file  Other Topics Concern  . Not on file  Social History Narrative   Marital status: Married x 30 years      Children:  2 children; 1 stepchild; grandchild and two step grandchildren.      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      Tobacco:  No tobacco      Alcohol: +  weekends sometimes; two drinks per month.      Drugs: none      Exercise:  Walking and cycling.     Family History: The patient's family history includes Arthritis in his father; Asthma in his mother; Cancer in his father; Diabetes in his mother; Heart attack in his father; Heart disease in his father; Sarcoidosis in his mother; Stroke in his father and mother.  ROS:  Please see the history of present illness.    ROS  All other systems reviewed and negative.   EKGs/Labs/Other Studies Reviewed:    The following studies were reviewed today: PAP download  EKG:  EKG is not ordered today.    Recent Labs: 01/13/2017: Hemoglobin 12.0; Platelets 185 01/28/2017: ALT 19 06/24/2017: BUN 16; Creatinine, Ser 1.07; Potassium 4.3; Sodium 138   Recent Lipid Panel    Component Value Date/Time   CHOL 126 01/28/2017 0731   TRIG 65 01/28/2017 0731   HDL 54 01/28/2017 0731   CHOLHDL 2.3 01/28/2017 0731   CHOLHDL 2.7 05/04/2016 0736   VLDL 14 05/04/2016 0736   LDLCALC 59 01/28/2017 0731    Physical Exam:    VS:  BP 118/62   Pulse 80   Ht 5\' 11"  (1.803 m)   Wt 264 lb (119.7 kg)   SpO2 98%   BMI 36.82 kg/m     Wt Readings from Last 3 Encounters:  12/29/17 264 lb (119.7 kg)  07/01/17 269 lb (122 kg)  01/28/17 261 lb (118.4 kg)     GEN:  Well nourished, well developed in no acute distress HEENT: Normal NECK: No JVD; No carotid bruits LYMPHATICS: No lymphadenopathy CARDIAC: RRR, no murmurs, rubs, gallops RESPIRATORY:  Clear to auscultation without rales, wheezing or rhonchi  ABDOMEN: Soft, non-tender, non-distended MUSCULOSKELETAL:  No edema; No deformity  SKIN: Warm and dry NEUROLOGIC:  Alert and oriented x 3 PSYCHIATRIC:  Normal affect   ASSESSMENT:    1. Coronary artery disease involving native coronary artery of native heart without angina pectoris   2. Essential hypertension, benign   3. Pure hypercholesterolemia   4. OSA on CPAP    PLAN:    In order of problems  listed above:  1.  ASCAD - s/p non-STEMI with cath showing 99% prox RCA s/p DES.  He has not had any further anginal symptoms.  He will continue on aspirin 81 mg daily, Plavix 75 mg daily, beta-blocker and statin.  2.  HTN -BP is well controlled on current medication.  He will continue on Lopressor 25 mg twice daily and HCTZ 25 mg daily.  Creatinine was normal at 1.07 on 06/24/2017 and potassium was 4.3.  3.  Hyperlipidemia - His LDL was 59 01/28/2017.  He will continue on pravastatin 80 mg daily and Zetia 10 mg daily.  I will repeat an FLP and ALT.  4.  OSA - the patient is tolerating PAP therapy well without any problems. The PAP download was reviewed today and showed an AHI of 0.8/hr on 11 cm H2O with 96% compliance in using more than 4 hours nightly.  The patient has been using and benefiting from PAP use and will continue to benefit from therapy.      Medication Adjustments/Labs and Tests Ordered: Current medicines are reviewed at length with the patient today.  Concerns regarding medicines are outlined above.  No orders of the defined types were placed in this encounter.  No orders of the defined types were placed in this encounter.   Signed, Armanda Magic, MD  12/29/2017 8:52 AM    [facility]

## 2017-12-31 ENCOUNTER — Telehealth: Payer: Self-pay

## 2017-12-31 DIAGNOSIS — R7989 Other specified abnormal findings of blood chemistry: Secondary | ICD-10-CM

## 2017-12-31 NOTE — Telephone Encounter (Signed)
Notes recorded by Phineas Semenobertson, Hessie Varone, RN on 12/31/2017 at 8:44 AM EDT Pt made aware of lab results. Instructed pt to stop HCTZ due to elevated creatinine level and repeat BMET in 1 week. 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 I also advised pt to monitor and record blood pressure daily and call in a week with blood pressure readings. He stated understanding and thankful for the call.   Notes recorded by Quintella Reicherturner, Traci R, MD on 12/29/2017 at 10:09 PM EDT Creatinine elevated at 1.55 today. Please have patient stop HCTZ and repeat BMEt in 1 week. Please have him check his BP daily for a week and call with results.

## 2018-01-01 ENCOUNTER — Ambulatory Visit: Payer: 59 | Admitting: Urgent Care

## 2018-01-05 ENCOUNTER — Other Ambulatory Visit: Payer: 59 | Admitting: *Deleted

## 2018-01-05 DIAGNOSIS — R7989 Other specified abnormal findings of blood chemistry: Secondary | ICD-10-CM

## 2018-01-06 LAB — BASIC METABOLIC PANEL
BUN/Creatinine Ratio: 15 (ref 9–20)
BUN: 15 mg/dL (ref 6–24)
CALCIUM: 9.4 mg/dL (ref 8.7–10.2)
CHLORIDE: 109 mmol/L — AB (ref 96–106)
CO2: 24 mmol/L (ref 20–29)
CREATININE: 0.99 mg/dL (ref 0.76–1.27)
GFR calc Af Amer: 99 mL/min/{1.73_m2} (ref >59)
GFR calc non Af Amer: 86 mL/min/{1.73_m2} (ref >59)
GLUCOSE: 73 mg/dL (ref 65–99)
Potassium: 4.1 mmol/L (ref 3.5–5.2)
Sodium: 145 mmol/L — ABNORMAL HIGH (ref 134–144)

## 2018-01-15 ENCOUNTER — Other Ambulatory Visit: Payer: Self-pay | Admitting: Cardiology

## 2018-02-20 DIAGNOSIS — L600 Ingrowing nail: Secondary | ICD-10-CM | POA: Diagnosis not present

## 2018-02-21 ENCOUNTER — Ambulatory Visit: Payer: 59 | Admitting: Cardiology

## 2018-02-24 DIAGNOSIS — R05 Cough: Secondary | ICD-10-CM | POA: Diagnosis not present

## 2018-02-24 DIAGNOSIS — S91209D Unspecified open wound of unspecified toe(s) with damage to nail, subsequent encounter: Secondary | ICD-10-CM | POA: Diagnosis not present

## 2018-03-01 ENCOUNTER — Other Ambulatory Visit: Payer: Self-pay | Admitting: Cardiology

## 2018-03-05 ENCOUNTER — Other Ambulatory Visit: Payer: Self-pay | Admitting: Cardiology

## 2018-03-05 DIAGNOSIS — E7800 Pure hypercholesterolemia, unspecified: Secondary | ICD-10-CM

## 2018-05-19 ENCOUNTER — Telehealth: Payer: Self-pay | Admitting: Cardiology

## 2018-05-19 DIAGNOSIS — I2510 Atherosclerotic heart disease of native coronary artery without angina pectoris: Secondary | ICD-10-CM | POA: Diagnosis not present

## 2018-05-19 DIAGNOSIS — Z0000 Encounter for general adult medical examination without abnormal findings: Secondary | ICD-10-CM | POA: Diagnosis not present

## 2018-05-19 DIAGNOSIS — I10 Essential (primary) hypertension: Secondary | ICD-10-CM | POA: Diagnosis not present

## 2018-05-19 DIAGNOSIS — Z125 Encounter for screening for malignant neoplasm of prostate: Secondary | ICD-10-CM | POA: Diagnosis not present

## 2018-05-19 DIAGNOSIS — E7800 Pure hypercholesterolemia, unspecified: Secondary | ICD-10-CM | POA: Diagnosis not present

## 2018-05-19 DIAGNOSIS — Z23 Encounter for immunization: Secondary | ICD-10-CM | POA: Diagnosis not present

## 2018-05-19 MED ORDER — METOPROLOL TARTRATE 50 MG PO TABS: 50.0000 mg | ORAL_TABLET | Freq: Two times a day (BID) | ORAL | Status: DC

## 2018-05-19 NOTE — Telephone Encounter (Signed)
Spoke with patient who reports he was seen at his PCPs office this morning and his BP was 165/90.  He was told to contact Dr Mayford Knifeurner for f/u and treatment of his elevated BP.  He hasn't really been checking his blood pressures at home and only has the one reading right now.  About 2 months ago his pressures were running 140s/85-86 at home.  HR has been between 70-80s.  He is taking Lopressor 25 mg BID for HTN.  Had been on HCTZ but it was stopped due to elevated kidney function.  He reports his weight being up and that he has been under a lot of stress lately.  He is aware this information will be sent to Dr Mayford Knifeurner for review and he will be called back with further instructions.  He was grateful for the call and will await orders.

## 2018-05-19 NOTE — Telephone Encounter (Signed)
New Message          Patient had an annual today at his PCP and today bp was  high (165/90) Patient would like a call back concerning this matter.

## 2018-05-19 NOTE — Telephone Encounter (Signed)
Increase lopressor to 50mg  BID and  Check HR and BP daily for a week and call with the results.

## 2018-05-19 NOTE — Telephone Encounter (Signed)
Pt aware to increase Metoprolol to 50 mg BID, check HR and BP daily for 1 week and call with results.  He will double his 25 mg tablets for now (just received 90 day supply) and call back next week with readings.

## 2018-06-01 DIAGNOSIS — J209 Acute bronchitis, unspecified: Secondary | ICD-10-CM | POA: Diagnosis not present

## 2018-06-01 DIAGNOSIS — H401122 Primary open-angle glaucoma, left eye, moderate stage: Secondary | ICD-10-CM | POA: Diagnosis not present

## 2018-06-01 DIAGNOSIS — H1851 Endothelial corneal dystrophy: Secondary | ICD-10-CM | POA: Diagnosis not present

## 2018-06-01 DIAGNOSIS — H401111 Primary open-angle glaucoma, right eye, mild stage: Secondary | ICD-10-CM | POA: Diagnosis not present

## 2018-06-08 NOTE — Telephone Encounter (Signed)
Blood Pressure readings 11/27- 133/83, 67HR 11/26- 136/81, 68 HR 11/25- 133/79, 65 HR 11/24- 127/83, 64 HR 11/23- 130/83, 64 HR 11/22- 125/69, 62 HR

## 2018-06-09 NOTE — Telephone Encounter (Signed)
B/P controlled 

## 2018-06-09 NOTE — Telephone Encounter (Signed)
Spoke with the patient, he accepted Dr. Turner's recommendations.  

## 2018-06-11 ENCOUNTER — Other Ambulatory Visit: Payer: Self-pay | Admitting: Cardiology

## 2018-08-02 DIAGNOSIS — R509 Fever, unspecified: Secondary | ICD-10-CM | POA: Diagnosis not present

## 2018-08-05 DIAGNOSIS — J069 Acute upper respiratory infection, unspecified: Secondary | ICD-10-CM | POA: Diagnosis not present

## 2018-08-15 ENCOUNTER — Other Ambulatory Visit: Payer: Self-pay | Admitting: Cardiology

## 2018-08-15 MED ORDER — METOPROLOL TARTRATE 50 MG PO TABS: 50.0000 mg | ORAL_TABLET | Freq: Two times a day (BID) | ORAL | 1 refills | Status: DC

## 2018-10-20 ENCOUNTER — Other Ambulatory Visit: Payer: Self-pay | Admitting: Cardiology

## 2018-10-26 ENCOUNTER — Other Ambulatory Visit: Payer: Self-pay | Admitting: Cardiology

## 2018-11-22 DIAGNOSIS — H401111 Primary open-angle glaucoma, right eye, mild stage: Secondary | ICD-10-CM | POA: Diagnosis not present

## 2018-11-22 DIAGNOSIS — H401122 Primary open-angle glaucoma, left eye, moderate stage: Secondary | ICD-10-CM | POA: Diagnosis not present

## 2018-11-22 DIAGNOSIS — H1851 Endothelial corneal dystrophy: Secondary | ICD-10-CM | POA: Diagnosis not present

## 2018-12-12 ENCOUNTER — Other Ambulatory Visit: Payer: Self-pay | Admitting: Cardiology

## 2018-12-12 DIAGNOSIS — E7800 Pure hypercholesterolemia, unspecified: Secondary | ICD-10-CM

## 2018-12-15 ENCOUNTER — Telehealth: Payer: Self-pay

## 2018-12-15 NOTE — Telephone Encounter (Signed)

## 2018-12-15 NOTE — Progress Notes (Signed)
Virtual Visit via Telephone Note   This visit type was conducted due to national recommendations for restrictions regarding the COVID-19 Pandemic (e.g. social distancing) in an effort to limit this patient's exposure and mitigate transmission in our community.  Due to his co-morbid illnesses, this patient is at least at moderate risk for complications without adequate follow up.  This format is felt to be most appropriate for this patient at this time.  The patient did not have access to video technology/had technical difficulties with video requiring transitioning to audio format only (telephone).  All issues noted in this document were discussed and addressed.  No physical exam could be performed with this format.  Please refer to the patient's chart for his  consent to telehealth for [facility].   Evaluation Performed:  Follow-up visit  This visit type was conducted due to national recommendations for restrictions regarding the COVID-19 Pandemic (e.g. social distancing).  This format is felt to be most appropriate for this patient at this time.  All issues noted in this document were discussed and addressed.  No physical exam was performed (except for noted visual exam findings with Video Visits).  Please refer to the patient's chart (MyChart message for video visits and phone note for telephone visits) for the patient's consent to telehealth for [facility].  Date:  12/16/2018   ID:  Raymond BickerHoward N Trembath Jr., DOB [birth year], MRN [id number]  Patient Location:  Home  Provider location:   [place]  PCP:  Shaune PollackGates, Donna, MD (Inactive)  Cardiologist:  Armanda Magicraci Maleyah Evans, MD Electrophysiologist:  None   Chief Complaint:  CAD, HTN, lipids and OSA  History of Present Illness:    Raymond BickerHoward N Mouser Jr. is a 58 y.o. male who presents via audio/video conferencing for a telehealth visit today.    Raymond BickerHoward N Ki Jr. is a 58 y.o. male with a hx of HTN, OSA, obesity, ASCAD s/p non-STEMI with cath  showing 99% prox RCA s/p DES (now on DAPT) with normal LVF.  He is here today for followup and is doing well.  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 He is compliant with his meds and is tolerating meds with no SE.  He is doing well with his CPAP device and thinks that he has gotten used to it.  He tolerates the mask and feels the pressure is adequate.  Since going on CPAP he feels rested in the am and has no significant daytime sleepiness.  He denies any significant mouth or nasal dryness or nasal congestion.  He does not think that he snores.    The patient does not have symptoms concerning for COVID-19 infection (fever, chills, cough, or new shortness of breath).    Prior CV studies:   The following studies were reviewed today:  PAP compliance download  Past Medical History:  Diagnosis Date   Allergic rhinitis    /asthma   Arthritis    "shoulders, back, knees" (09/16/2015)   Bulging of intervertebral disc between L4 and L5    CAD (coronary artery disease), native coronary artery 09/15/2015   s/p NSTEMI 09/2015 with cath showing 99% RCA s/p DES now on DAPT   Childhood asthma    ED (erectile dysfunction)    Essential hypertension, benign 12/19/2012   GERD (gastroesophageal reflux disease)    after taking meds , not treated at present  Glaucoma    Health examination of defined subpopulation 12/19/2012   Hyperlipidemia LDL goal <70    Meningitis hospitalized 07/2009   rule out bells palsy ruled out menigitis   Obesity 06/26/2013   OSA on CPAP 12/19/2012   Osteoarthritis    OA knees, shoulders; DDD lumbar spine   Pure hypercholesterolemia 12/19/2012   Seasonal allergies    Past Surgical History:  Procedure Laterality Date   ANKLE SURGERY  1985   bone spur removal ankle.   CARDIAC CATHETERIZATION N/A 09/16/2015   Procedure: Left Heart  Cath and Coronary Angiography;  Surgeon: Burnell Blanks, MD;  Location: [place] [facility];  Service: Cardiovascular;  Laterality: N/A;   CARDIAC CATHETERIZATION N/A 09/16/2015   Procedure: Coronary Stent Intervention;  Surgeon: Burnell Blanks, MD;  Location: [place] [facility];  Service: Cardiovascular;  Laterality: N/A;   MYRINGOTOMY WITH TUBE PLACEMENT Left ~ 07/2009   NASAL SINUS SURGERY  ~ 2007   "& fixed my septum"   SHOULDER OPEN ROTATOR CUFF REPAIR Right 10/2013   TRIGGER FINGER RELEASE Right 07/01/2017   Procedure: RELEASE TRIGGER FINGER/A-1 PULLEY RIGHT THUMB;  Surgeon: Leanora Cover, MD;  Location: [place];  Service: Orthopedics;  Laterality: Right;   VASECTOMY     WISDOM TOOTH EXTRACTION  ~ 1992     Current Meds  Medication Sig   acetaminophen (TYLENOL) 500 MG tablet Take 500 mg by mouth every 6 (six) hours as needed.   aspirin EC 81 MG tablet Take 81 mg by mouth daily.   B Complex-Biotin-FA (SUPER B-COMPLEX) TABS Take 1 tablet by mouth daily.   cetirizine (ZYRTEC) 10 MG tablet Take 10 mg by mouth daily as needed for allergies.    clopidogrel (PLAVIX) 75 MG tablet TAKE 1 TABLET BY MOUTH  DAILY   ezetimibe (ZETIA) 10 MG tablet TAKE 1 TABLET BY MOUTH  DAILY   latanoprost (XALATAN) 0.005 % ophthalmic solution Place 1 drop into both eyes at bedtime.   metoprolol tartrate (LOPRESSOR) 50 MG tablet TAKE 1 TABLET BY MOUTH TWO  TIMES DAILY   nitroGLYCERIN (NITROSTAT) 0.4 MG SL tablet PLACE 1 TABLET (0.4 MG TOTAL) UNDER THE TONGUE EVERY 5 (FIVE) MINUTES AS NEEDED FOR CHEST PAIN.   pravastatin (PRAVACHOL) 80 MG tablet TAKE 1 TABLET BY MOUTH  DAILY   timolol (TIMOPTIC) 0.5 % ophthalmic solution Place 1 drop into both eyes every morning.   VENTOLIN HFA 108 (90 Base) MCG/ACT inhaler Inhale 2 puffs into the lungs every 4 (four) hours as needed. Shortness of breath or wheezing     Allergies:   Atorvastatin and Rosuvastatin   Social History     Tobacco Use   Smoking status: Former Smoker   Smokeless tobacco: Never Used   Tobacco comment: "smoked 3 packs of cigaretes in my whole life"  Substance Use Topics   Alcohol use: Yes    Comment: 09/16/2015 "might have a couple drinks q couple months"   Drug use: No     Family Hx: The patient's family history includes Arthritis in his father; Asthma in his mother; Cancer in his father; Diabetes in his mother; Heart attack in his father; Heart disease in his father; Sarcoidosis in his mother; Stroke in his father and mother.  ROS:   Please see the history of present illness.     All other systems reviewed and are negative.   Labs/Other Tests and Data Reviewed:    Recent Labs: 12/29/2017: ALT 33 01/05/2018: BUN 15; Creatinine,  Ser 0.99; Potassium 4.1; Sodium 145   Recent Lipid Panel Lab Results  Component Value Date/Time   CHOL 133 12/29/2017 09:05 AM   TRIG 76 12/29/2017 09:05 AM   HDL 45 12/29/2017 09:05 AM   CHOLHDL 3.0 12/29/2017 09:05 AM   CHOLHDL 2.7 05/04/2016 07:36 AM   LDLCALC 73 12/29/2017 09:05 AM    Wt Readings from Last 3 Encounters:  12/16/18 281 lb (127.5 kg)  12/29/17 264 lb 3.2 oz (119.8 kg)  12/29/17 264 lb (119.7 kg)     Objective:    Vital Signs:  BP (!) 148/81    Temp 98.1 F (36.7 C)    Ht 5\' 11"  (1.803 m)    Wt 281 lb (127.5 kg)    BMI 39.19 kg/m     ASSESSMENT & PLAN:    1.  OSA - the patient is tolerating PAP therapy well without any problems. The PAP download was reviewed today and showed an AHI of 0.7/hr on 11 cm H2O with 100% compliance in using more than 4 hours nightly.  The patient has been using and benefiting from PAP use and will continue to benefit from therapy.   2.  Hypertension - his BP is borderline controlled on exam.  He will continue on Lopressor 50mg  BID. He is not at home right now and forgot to record his HR.  He will check his BP and HR several times over the weekend and call with results next week.    3.  ASCAD -  s/p NSTEMI with cath showing 99% pRCA s/p DES in 09/2015.  He has not had any anginal sx since I saw him last.  He will continue on DAPT with ASA 81mg  daily and Plavix 75mg  daily.  He will continue on BB as well as statin.   4.  Hyperlipidemia - his LDL goal is < 70.  His last LDL was 73 a year ago.  I will repeat an FLP and ALT.  He will continue on Pravastatin 80mg  daily and Zetia 10mg  daily.    5.  Morbid Obesity - his BMI is borderline on 40 with comorbidities.  I have encouraged him to get into a routine exercise program and cut back on carbs and portions. He has had a lot of problems with low back pain making exercise hard but he is going to try to get out walking.   COVID-19 Education: The signs and symptoms of COVID-19 were discussed with the patient and how to seek care for testing (follow up with PCP or arrange E-visit).  The importance of social distancing was discussed today.  Patient Risk:   After full review of this patient's clinical status, I feel that they are at least moderate risk at this time.  Time:   Today, I have spent 12 minutes directly with the patient on telephone discussing medical problems including OSA, CAD, HTN, lipids.  We also reviewed the symptoms of COVID 19 and the ways to protect against contracting the virus with telehealth technology.  I spent an additional 5 minutes reviewing patient's chart including PAP download and labs.  Medication Adjustments/Labs and Tests Ordered: Current medicines are reviewed at length with the patient today.  Concerns regarding medicines are outlined above.  Tests Ordered: No orders of the defined types were placed in this encounter.  Medication Changes: No orders of the defined types were placed in this encounter.   Disposition:  Follow up in 1 year(s)  Signed, Armanda Magicraci Ashten Sarnowski, MD  12/16/2018 9:55  AM    [place]

## 2018-12-16 ENCOUNTER — Ambulatory Visit: Payer: 59 | Admitting: Cardiology

## 2018-12-16 ENCOUNTER — Encounter: Payer: Self-pay | Admitting: Cardiology

## 2018-12-16 ENCOUNTER — Telehealth (INDEPENDENT_AMBULATORY_CARE_PROVIDER_SITE_OTHER): Payer: 59 | Admitting: Cardiology

## 2018-12-16 ENCOUNTER — Other Ambulatory Visit: Payer: Self-pay

## 2018-12-16 VITALS — BP 148/81 | Temp 98.1°F | Ht 71.0 in | Wt 281.0 lb

## 2018-12-16 DIAGNOSIS — G4733 Obstructive sleep apnea (adult) (pediatric): Secondary | ICD-10-CM

## 2018-12-16 DIAGNOSIS — I10 Essential (primary) hypertension: Secondary | ICD-10-CM

## 2018-12-16 DIAGNOSIS — E7800 Pure hypercholesterolemia, unspecified: Secondary | ICD-10-CM

## 2018-12-16 DIAGNOSIS — I2510 Atherosclerotic heart disease of native coronary artery without angina pectoris: Secondary | ICD-10-CM | POA: Diagnosis not present

## 2018-12-16 DIAGNOSIS — Z9989 Dependence on other enabling machines and devices: Secondary | ICD-10-CM

## 2018-12-16 NOTE — Patient Instructions (Signed)
Medication Instructions:  Your physician recommends that you continue on your current medications as directed. Please refer to the Current Medication list given to you today.  If you need a refill on your cardiac medications before your next appointment, please call your pharmacy.   Lab work: Fasting: Fasting labs, Lipid and Liver: 01/13/19  If you have labs (blood work) drawn today and your tests are completely normal, you will receive your results only by: Marland Kitchen MyChart Message (if you have MyChart) OR . A paper copy in the mail If you have any lab test that is abnormal or we need to change your treatment, we will call you to review the results.  Testing/Procedures: None  Follow-Up: At [facility], you and your health needs are our priority.  As part of our continuing mission to provide you with exceptional heart care, we have created designated Provider Care Teams.  These Care Teams include your primary Cardiologist (physician) and Advanced Practice Providers (APPs -  Physician Assistants and Nurse Practitioners) who all work together to provide you with the care you need, when you need it. You will need a follow up appointment in 1 years.  Please call our office 2 months in advance to schedule this appointment.  You may see Dr. Radford Pax or one of the following Advanced Practice Providers on your designated Care Team:   [place], PA-C Melina Copa, PA-C . Ermalinda Barrios, PA-C

## 2018-12-16 NOTE — Telephone Encounter (Signed)
New message   Patient is returning call about a virtual visit. Please call.

## 2018-12-16 NOTE — Telephone Encounter (Signed)
Error

## 2018-12-21 ENCOUNTER — Telehealth: Payer: Self-pay | Admitting: Cardiology

## 2018-12-21 MED ORDER — AMLODIPINE BESYLATE 5 MG PO TABS: 5.0000 mg | ORAL_TABLET | Freq: Every day | ORAL | 3 refills | Status: DC

## 2018-12-21 NOTE — Telephone Encounter (Signed)
BP not controlled - please start Amlodipine 5mg  daily and check BP daily for a week 3 hours after taking meds and call in 1 week with results. Please ask Gae Bon to make sure she send PAP download to [employer] MD

## 2018-12-21 NOTE — Telephone Encounter (Signed)
Spoke with the patient, he accepted starting amlodipine. He will send more blood pressure values in a week.

## 2018-12-21 NOTE — Telephone Encounter (Signed)
Patient called to give BP readings as he was advised to do:  6/13 148/88 HR 53 7am          144/86 HR 62 130pm 6/14  144/90 HR 58 1015am          144/93 HR 60 1100pm 6/15  134/83 HR 63  430am 6/16  138/79 HR 71 445am 6/17  144/90 HR 75 600am  Patient also states he still has not received a letter about his CPAP machine being complaint for [employer] physical.

## 2018-12-22 NOTE — Telephone Encounter (Signed)
Follow up    B/p readings:   B/p after taking pill 3 hours after taking amlodipine 131/87 hr 68 at 11:15pm.

## 2018-12-23 NOTE — Telephone Encounter (Signed)
Patient has an old machine S9 that he has to take to [facility] to have downloaded and have faxed to me. Then I will fax it to him at [phone].

## 2019-01-04 ENCOUNTER — Telehealth: Payer: Self-pay | Admitting: Cardiology

## 2019-01-04 NOTE — Telephone Encounter (Signed)
New Message           Patient is calling in today to get a Rx for his C-Pap machine sent to [facility] [phone] Fax [phone] C-pap Rx  needs to have pressure setting on it. Pls call to adise.

## 2019-01-12 ENCOUNTER — Telehealth: Payer: Self-pay | Admitting: Cardiology

## 2019-01-12 NOTE — Telephone Encounter (Signed)
New message:    Patient calling stating he need a prescription for his CPAP machine. Please call patient.

## 2019-01-13 ENCOUNTER — Other Ambulatory Visit (INDEPENDENT_AMBULATORY_CARE_PROVIDER_SITE_OTHER): Payer: 59 | Admitting: *Deleted

## 2019-01-13 ENCOUNTER — Other Ambulatory Visit: Payer: Self-pay

## 2019-01-13 DIAGNOSIS — E7800 Pure hypercholesterolemia, unspecified: Secondary | ICD-10-CM

## 2019-01-13 LAB — LIPID PANEL
Chol/HDL Ratio: 2.6 ratio (ref 0.0–5.0)
Cholesterol, Total: 129 mg/dL (ref 100–199)
HDL: 50 mg/dL (ref >39)
LDL Calculated: 64 mg/dL (ref 0–99)
Triglycerides: 74 mg/dL (ref 0–149)
VLDL Cholesterol Cal: 15 mg/dL (ref 5–40)

## 2019-01-13 LAB — HEPATIC FUNCTION PANEL
ALT: 15 IU/L (ref 0–44)
AST: 17 IU/L (ref 0–40)
Albumin: 4.4 g/dL (ref 3.8–4.9)
Alkaline Phosphatase: 74 IU/L (ref 39–117)
Bilirubin Total: 0.3 mg/dL (ref 0.0–1.2)
Bilirubin, Direct: 0.09 mg/dL (ref 0.00–0.40)
Total Protein: 7.4 g/dL (ref 6.0–8.5)

## 2019-01-13 NOTE — Telephone Encounter (Signed)
Spoke to patient and I am working on getting his prescription. Pt is aware and agreeable to treatment.

## 2019-01-13 NOTE — Telephone Encounter (Signed)
F/u   Pt came in for lab work today and wanted to follow up about his C-pap prescription he needs [facility] to [facility]. Pt would like a call when it gets sent in.

## 2019-01-13 NOTE — Telephone Encounter (Signed)
Spoke to patient to see how I could help him and I am awaiting pressure settings of 11 cm H20 from Dr Radford Pax.

## 2019-01-16 ENCOUNTER — Telehealth: Payer: Self-pay | Admitting: *Deleted

## 2019-01-16 DIAGNOSIS — G4733 Obstructive sleep apnea (adult) (pediatric): Secondary | ICD-10-CM

## 2019-01-16 DIAGNOSIS — Z9989 Dependence on other enabling machines and devices: Secondary | ICD-10-CM

## 2019-01-16 NOTE — Telephone Encounter (Signed)
-----   Message from Sueanne Margarita, MD sent at 01/14/2019 10:49 PM EDT ----- Regarding: RE: Need Setting order Please order CPAP at 11cm H2O with EPR 3 and heated humidity and mask of choice  Traci Turner ----- Message ----- From: Freada Bergeron, CMA Sent: 01/13/2019  12:41 PM EDT To: Sueanne Margarita, MD Subject: Need Setting order                             [facility] needs [facility] prescription for his replacement unit. Please write 11 cm H20 EPR 3  Thanks, Gae Bon

## 2019-01-16 NOTE — Telephone Encounter (Signed)
Order placed to [facility] today.

## 2019-01-17 NOTE — Telephone Encounter (Signed)
-----   Message from Sueanne Margarita, MD sent at 01/14/2019 10:49 PM EDT ----- Regarding: RE: Need Setting order Please order CPAP at 11cm H2O with EPR 3 and heated humidity and mask of choice

## 2019-01-17 NOTE — Telephone Encounter (Signed)
[facility] placed to [facility] today and patient notified.

## 2019-02-02 MED ORDER — AMLODIPINE BESYLATE 5 MG PO TABS: 5.0000 mg | ORAL_TABLET | Freq: Every day | ORAL | 3 refills | Status: DC

## 2019-02-02 NOTE — Telephone Encounter (Signed)
Pt called and wanted his medication resent to his preferred pharmacy. I resent pt's medication as requested. Confirmation received.

## 2019-02-02 NOTE — Addendum Note (Signed)
Addended by: Derl Barrow on: 02/02/2019 08:23 AM   Modules accepted: Orders

## 2019-03-14 ENCOUNTER — Other Ambulatory Visit: Payer: Self-pay | Admitting: Cardiology

## 2019-03-14 MED ORDER — NITROGLYCERIN 0.4 MG SL SUBL: 0.4000 mg | SUBLINGUAL_TABLET | SUBLINGUAL | 5 refills | Status: DC | PRN

## 2019-03-15 ENCOUNTER — Other Ambulatory Visit: Payer: Self-pay | Admitting: Cardiology

## 2019-03-15 DIAGNOSIS — E7800 Pure hypercholesterolemia, unspecified: Secondary | ICD-10-CM

## 2019-03-15 MED ORDER — NITROGLYCERIN 0.4 MG SL SUBL: 0.4000 mg | SUBLINGUAL_TABLET | SUBLINGUAL | 5 refills | Status: DC | PRN

## 2019-06-28 IMAGING — NM NM MISC PROCEDURE
3 series · 18 of 18 positions shown · non-contrast
Comparison: none

[Series 1: wbr_s-proj_st stress_(id)_sa · 6.5mm · 6.51mm/px · 6 of 512 frames shown (1 of 2)]
[frame 43/512]
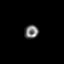
[frame 128/512]
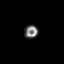
[frame 214/512]
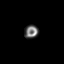
[frame 299/512]
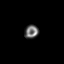
[frame 384/512]
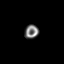
[frame 470/512]
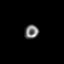

[Series 1: wbr_s-proj_st stress_(id)_sa · 6.5mm · 6.51mm/px · 6 of 64 frames shown (2 of 2)]
[frame 6/64]
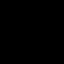
[frame 16/64]
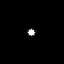
[frame 27/64]
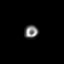
[frame 38/64]
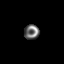
[frame 48/64]
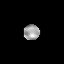
[frame 59/64]
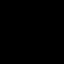

[Series 1: wbr_r-proj_st rest_(id)_sa · 6.5mm · 6.51mm/px · 6 of 64 frames shown]
[frame 6/64]
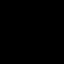
[frame 16/64]
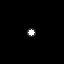
[frame 27/64]
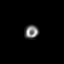
[frame 38/64]
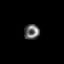
[frame 48/64]
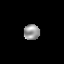
[frame 59/64]
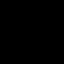

[18 of 18 positions shown; findings below may reference images not displayed]

Canned report from images found in remote index.

Refer to host system for actual result text.

## 2019-09-13 ENCOUNTER — Telehealth: Payer: Self-pay | Admitting: Cardiology

## 2019-09-13 DIAGNOSIS — I10 Essential (primary) hypertension: Secondary | ICD-10-CM

## 2019-09-13 NOTE — Telephone Encounter (Signed)
Patient states that he has been having some swelling in his legs for a while now. He denies any shortness of breath or weight gain of more than 3 lbs in a day or 5 lbs in one week. He was taken off of HCTZ in 06/20 and then soon after was started on amlodipine 5 mg daily. He believes that the leg swelling began after this.  Patient does not regularly take his blood pressure. Patient otherwise has no complaints.

## 2019-09-13 NOTE — Telephone Encounter (Signed)
Pt c/o swelling: STAT is pt has developed SOB within 24 hours  1) How much weight have you gained and in what time span? 20 lbs doesn't know in what time span  2) If swelling, where is the swelling located? Legs (calves down)  3) Are you currently taking a fluid pill? Yes   4) Are you currently SOB? No   5) Do you have a log of your daily weights (if so, list)? No   6) Have you gained 3 pounds in a day or 5 pounds in a week? 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 He states he believes it started once he was taken off his previous medication and put on amlodipine. He states the swelling is only below his calves and no other symptoms are present. Please advise.

## 2019-09-13 NOTE — Telephone Encounter (Signed)
I would like him to call his PCP to let them know since they stopped the diuretic and started the amlodipine which is likely the reason for the LE edema

## 2019-09-14 NOTE — Telephone Encounter (Signed)
Patient is aware to call his PCP in regards to adjusting his medications due to swelling.

## 2019-09-21 NOTE — Telephone Encounter (Signed)
Will forward to Dr.Turner 

## 2019-09-21 NOTE — Telephone Encounter (Signed)
Raymond Jordan, with [facility]  Dr. Christena Flake office, states Dr. Chanetta Marshall also assumes leg swelling in both knees experienced by the patient is likely due to switching to amLODipine (NORVASC) 5 MG tablet medication. She states she is requesting to have an alternative BP medication prescribed for the patient.

## 2019-09-21 NOTE — Telephone Encounter (Signed)
Please find out if PCP would be ok with starting ARB

## 2019-09-22 NOTE — Telephone Encounter (Signed)
What is the Twilight study

## 2019-09-22 NOTE — Telephone Encounter (Signed)
The patient's losartan-hct was actually stopped at hospital discharge 3/17. The Twilight study is related to Brilinta so that is probably unrelated. Please advise on medication and dose to replace amlodipine 5 mg which is causing bilateral leg swelling.

## 2019-09-22 NOTE — Telephone Encounter (Signed)
Returned call to Wandalee Ferdinand, LPN at [facility] who states patient was seen by Dr. Chanetta Marshall on 3/17 for bilateral leg swelling. Dr. Chanetta Marshall felt that the leg swelling was r/t amlodipine because patient did not have SOB/DOE or chest pain. She did not advise patient to stop amlodipine because his BP that day was 156/81 mmHg. They did a BMP and are faxing results to our office.  Creatinine was 0.94, K+ was 4.0 and GFR was 100. Dr. Chanetta Marshall requests that Dr. Mayford Knife advise on medication change and dose of ARB. Patient previously took losartan but it was stopped due to a Twilight study according to Dr. Chanetta Marshall and they did not take patient off his diuretic in the past. I advised that I will forward message to Dr. Mayford Knife for ARB and dose. Wandalee Ferdinand thanked me for the call.

## 2019-09-23 NOTE — Telephone Encounter (Signed)
He has a hx of mildly elevated creatinine in 2019.  Stop amlodipine and start Losartan 25mg  daily.  Check BP at lunch daily for a week and call.  Repeat BMET in1 week

## 2019-09-25 MED ORDER — LOSARTAN POTASSIUM 25 MG PO TABS: 25.0000 mg | ORAL_TABLET | Freq: Every day | ORAL | 3 refills | Status: DC

## 2019-09-25 NOTE — Telephone Encounter (Signed)
Patient will stop amlodipine and start on 25 mg losartan. 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 He will take it in the evenings when he gets home. He will return in one week for a BMET

## 2019-09-25 NOTE — Addendum Note (Signed)
Addended by: Theresia Majors on: 09/25/2019 01:50 PM   Modules accepted: Orders

## 2019-10-02 ENCOUNTER — Telehealth: Payer: Self-pay | Admitting: Cardiology

## 2019-10-02 NOTE — Telephone Encounter (Signed)
BPs look good - no change in therapy

## 2019-10-02 NOTE — Telephone Encounter (Signed)
Patient calling to give his BP log as his was advised to do: 3/23 530am  123/75  HR 63  700pm  138/80  HR 68 3/24 750pm  133/75  HR 68 3/25 1000am 141/82  HR 62 3/27 915am  131/73  HR 56 3/28 1200pm 147/80  HR 58  715pm  132/72  HR 73

## 2019-10-09 ENCOUNTER — Other Ambulatory Visit: Payer: Self-pay

## 2019-10-09 ENCOUNTER — Other Ambulatory Visit: Payer: 59 | Admitting: *Deleted

## 2019-10-09 DIAGNOSIS — I10 Essential (primary) hypertension: Secondary | ICD-10-CM

## 2019-10-09 LAB — BASIC METABOLIC PANEL
BUN/Creatinine Ratio: 11 (ref 9–20)
BUN: 11 mg/dL (ref 6–24)
CO2: 23 mmol/L (ref 20–29)
Calcium: 8.6 mg/dL — ABNORMAL LOW (ref 8.7–10.2)
Chloride: 108 mmol/L — ABNORMAL HIGH (ref 96–106)
Creatinine, Ser: 1 mg/dL (ref 0.76–1.27)
GFR calc Af Amer: 97 mL/min/{1.73_m2} (ref >59)
GFR calc non Af Amer: 84 mL/min/{1.73_m2} (ref >59)
Glucose: 93 mg/dL (ref 65–99)
Potassium: 4 mmol/L (ref 3.5–5.2)
Sodium: 141 mmol/L (ref 134–144)

## 2019-11-02 ENCOUNTER — Other Ambulatory Visit: Payer: Self-pay

## 2019-11-02 MED ORDER — LOSARTAN POTASSIUM 25 MG PO TABS: 25.0000 mg | ORAL_TABLET | Freq: Every day | ORAL | 0 refills | Status: DC

## 2019-11-02 NOTE — Telephone Encounter (Signed)
Pt's medication was sent to pt's pharmacy as requested. Confirmation received.  °

## 2019-12-21 ENCOUNTER — Ambulatory Visit: Payer: 59 | Admitting: Cardiology

## 2019-12-21 ENCOUNTER — Other Ambulatory Visit: Payer: Self-pay

## 2019-12-21 ENCOUNTER — Encounter: Payer: Self-pay | Admitting: Cardiology

## 2019-12-21 VITALS — BP 148/92 | HR 61 | Ht 71.0 in | Wt 301.4 lb

## 2019-12-21 DIAGNOSIS — I10 Essential (primary) hypertension: Secondary | ICD-10-CM

## 2019-12-21 DIAGNOSIS — I2510 Atherosclerotic heart disease of native coronary artery without angina pectoris: Secondary | ICD-10-CM

## 2019-12-21 DIAGNOSIS — G4733 Obstructive sleep apnea (adult) (pediatric): Secondary | ICD-10-CM

## 2019-12-21 DIAGNOSIS — E7800 Pure hypercholesterolemia, unspecified: Secondary | ICD-10-CM

## 2019-12-21 DIAGNOSIS — Z9989 Dependence on other enabling machines and devices: Secondary | ICD-10-CM

## 2019-12-21 MED ORDER — CHLORTHALIDONE 25 MG PO TABS
25.0000 mg | ORAL_TABLET | Freq: Every day | ORAL | 3 refills | Status: DC
Start: 2019-12-21 — End: 2020-02-13

## 2019-12-21 NOTE — Addendum Note (Signed)
Addended by: Theresia Majors on: 12/21/2019 04:24 PM   Modules accepted: Orders

## 2019-12-21 NOTE — Patient Instructions (Signed)
Medication Instructions:  Your physician has recommended you make the following change in your medication: 1) START taking chlorthalidone 25 mg daily   *If you need a refill on your cardiac medications before your next appointment, please call your pharmacy*   Lab Work: Fasting lipids, ALT, and BMET in one week. If you have labs (blood work) drawn today and your tests are completely normal, you will receive your results only by: Marland Kitchen MyChart Message (if you have MyChart) OR . A paper copy in the mail If you have any lab test that is abnormal or we need to change your treatment, we will call you to review the results.   Testing/Procedures: Your physician has requested that you have a lexiscan myoview in 1-2 weeks. For further information please visit [email or web address] Please follow instruction sheet, as given.  Follow-Up: At [facility], you and your health needs are our priority.  As part of our continuing mission to provide you with exceptional heart care, we have created designated Provider Care Teams.  These Care Teams include your primary Cardiologist (physician) and Advanced Practice Providers (APPs -  Physician Assistants and Nurse Practitioners) who all work together to provide you with the care you need, when you need it.  Your next appointment:   1 year(s)  The format for your next appointment:   In Person  Provider:   You may see Armanda Magic, MD or one of the following Advanced Practice Providers on your designated Care Team:    Ronie Spies, PA-C  Jacolyn Reedy, PA-C    Other Instructions You have been referred to the [facility] Program.

## 2019-12-21 NOTE — Progress Notes (Signed)
Cardiology Office Note:    Date:  12/21/2019   ID:  Raymond Bicker., DOB 03/03/1961, MRN [id number]  PCP:  Shon Hale, MD  Cardiologist:  Armanda Magic, MD    Referring MD: Shon Hale, *   Chief Complaint  Patient presents with  . Coronary Artery Disease  . Hypertension  . Hyperlipidemia  . Sleep Apnea    History of Present Illness:    Raymond Markie. is a 59 y.o. male with a hx of HTN, OSA, obesity, ASCAD s/p non-STEMI with cath showing 99% prox RCA s/p DES (now on DAPT) with normal LVF.  he is here today for followup and is doing well.  He says that when he exerts himself on inclines he will feel a squeezing in his left arm and resolves when his walk goes back to level ground or slows down.  He has has not had any sx reminiscent of his MI.  He has not had any exertional chest pain or pressure.  He has some DOE that he says is stable and thinks it is related to being out of shape. He denies any PND, orthopnea,  dizziness, palpitations or syncope. He has gained over 20lbs in the past year.  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  He is compliant with his meds and is tolerating meds with no SE.    He is doing well with his CPAP device and thinks that he has gotten used to it.  He tolerates the mask and feels the pressure is adequate.  Since going on CPAP he feels rested in the am and has no significant daytime sleepiness.  He denies any significant mouth or nasal dryness or nasal congestion.  He does not think that he snores.     Past Medical History:  Diagnosis Date  . Allergic rhinitis    /asthma  . Arthritis    "shoulders, back, knees" (09/16/2015)  . Bulging of intervertebral disc between L4 and L5   . CAD (coronary artery disease), native coronary artery 09/15/2015   s/p NSTEMI 09/2015 with cath showing 99% RCA s/p DES now on DAPT  . Childhood asthma   . ED (erectile dysfunction)    . Essential hypertension, benign 12/19/2012  . GERD (gastroesophageal reflux disease)    after taking meds , not treated at present  . Glaucoma   . Health examination of defined subpopulation 12/19/2012  . Hyperlipidemia LDL goal <70   . Meningitis hospitalized 07/2009   rule out bells palsy ruled out menigitis  . Obesity 06/26/2013  . OSA on CPAP 12/19/2012  . Osteoarthritis    OA knees, shoulders; DDD lumbar spine  . Pure hypercholesterolemia 12/19/2012  . Seasonal allergies     Past Surgical History:  Procedure Laterality Date  . ANKLE SURGERY  1985   bone spur removal ankle.  Marland Kitchen CARDIAC CATHETERIZATION N/A 09/16/2015   Procedure: Left Heart Cath and Coronary Angiography;  Surgeon: Kathleene Hazel, MD;  Location: [facility] INVASIVE CV LAB;  Service: Cardiovascular;  Laterality: N/A;  . CARDIAC CATHETERIZATION N/A 09/16/2015   Procedure: Coronary Stent Intervention;  Surgeon: Kathleene Hazel, MD;  Location: [facility] INVASIVE CV LAB;  Service: Cardiovascular;  Laterality: N/A;  . MYRINGOTOMY WITH TUBE PLACEMENT Left ~ 07/2009  . NASAL SINUS SURGERY  ~ 2007   "& fixed my septum"  . SHOULDER OPEN ROTATOR CUFF REPAIR  Right 10/2013  . TRIGGER FINGER RELEASE Right 07/01/2017   Procedure: RELEASE TRIGGER FINGER/A-1 PULLEY RIGHT THUMB;  Surgeon: Leanora Cover, MD;  Location: [facility];  Service: Orthopedics;  Laterality: Right;  Marland Kitchen VASECTOMY    . WISDOM TOOTH EXTRACTION  ~ 1992    Current Medications: Current Meds  Medication Sig  . acetaminophen (TYLENOL) 500 MG tablet Take 500 mg by mouth every 6 (six) hours as needed.  Marland Kitchen aspirin EC 81 MG tablet Take 81 mg by mouth daily.  . B Complex-Biotin-FA (SUPER B-COMPLEX) TABS Take 1 tablet by mouth daily.  . cetirizine (ZYRTEC) 10 MG tablet Take 10 mg by mouth daily as needed for allergies.   Marland Kitchen clopidogrel (PLAVIX) 75 MG tablet TAKE 1 TABLET BY MOUTH  DAILY  . ezetimibe (ZETIA) 10 MG tablet TAKE 1 TABLET BY MOUTH  DAILY  .  latanoprost (XALATAN) 0.005 % ophthalmic solution Place 1 drop into both eyes at bedtime.  Marland Kitchen losartan (COZAAR) 25 MG tablet Take 1 tablet (25 mg total) by mouth daily. Please make yearly appt with Dr. Radford Pax for June for future refills. 1st attempt  . metoprolol tartrate (LOPRESSOR) 50 MG tablet TAKE 1 TABLET BY MOUTH  TWICE DAILY  . nitroGLYCERIN (NITROSTAT) 0.4 MG SL tablet Place 1 tablet (0.4 mg total) under the tongue every 5 (five) minutes as needed for chest pain.  . pravastatin (PRAVACHOL) 80 MG tablet TAKE 1 TABLET BY MOUTH  DAILY  . timolol (TIMOPTIC) 0.5 % ophthalmic solution Place 1 drop into both eyes every morning.  . VENTOLIN HFA 108 (90 Base) MCG/ACT inhaler Inhale 2 puffs into the lungs every 4 (four) hours as needed. Shortness of breath or wheezing     Allergies:   Atorvastatin and Rosuvastatin   Social History   Socioeconomic History  . Marital status: Married    Spouse name: Not on file  . Number of children: 2  . Years of education: Not on file  . Highest education level: Not on file  Occupational History  . Occupation: Truck Geophysicist/field seismologist  Tobacco Use  . Smoking status: Former Research scientist (life sciences)  . Smokeless tobacco: Never Used  . Tobacco comment: "smoked 3 packs of cigaretes in my whole life"  Vaping Use  . Vaping Use: Never used  Substance and Sexual Activity  . Alcohol use: Yes    Comment: 09/16/2015 "might have a couple drinks q couple months"  . Drug use: No  . Sexual activity: Yes  Other Topics Concern  . Not on file  Social History Narrative   Marital status: Married x 30 years      Children:  2 children; 1 stepchild; grandchild and two step grandchildren.      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      Tobacco:  No tobacco      Alcohol: + weekends sometimes; two drinks per month.      Drugs: none      Exercise:  Walking and cycling.   Social Determinants of Health   Financial  Resource Strain:   . Difficulty of Paying Living Expenses:   Food Insecurity:   . Worried About Charity fundraiser in the Last Year:   . Arboriculturist in the Last Year:   Transportation Needs:   . Film/video editor (Medical):   Marland Kitchen Lack of Transportation (Non-Medical):   Physical Activity:   . Days of Exercise per  Week:   . Minutes of Exercise per Session:   Stress:   . Feeling of Stress :   Social Connections:   . Frequency of Communication with Friends and Family:   . Frequency of Social Gatherings with Friends and Family:   . Attends Religious Services:   . Active Member of Clubs or Organizations:   . Attends Banker Meetings:   Marland Kitchen Marital Status:      Family History: The patient's family history includes Arthritis in his father; Asthma in his mother; Cancer in his father; Diabetes in his mother; Heart attack in his father; Heart disease in his father; Sarcoidosis in his mother; Stroke in his father and mother.  ROS:   Please see the history of present illness.    ROS  All other systems reviewed and negative.   EKGs/Labs/Other Studies Reviewed:    The following studies were reviewed today: PAP compliance download, EKG  EKG:  EKG is  ordered today.  The ekg ordered today demonstrates NSR with no ST changes and normal intervals  Recent Labs: 01/13/2019: ALT 15 10/09/2019: BUN 11; Creatinine, Ser 1.00; Potassium 4.0; Sodium 141   Recent Lipid Panel    Component Value Date/Time   CHOL 129 01/13/2019 0845   TRIG 74 01/13/2019 0845   HDL 50 01/13/2019 0845   CHOLHDL 2.6 01/13/2019 0845   CHOLHDL 2.7 05/04/2016 0736   VLDL 14 05/04/2016 0736   LDLCALC 64 01/13/2019 0845    Physical Exam:    VS:  BP (!) 148/92   Pulse 61   Ht 5\' 11"  (1.803 m)   Wt (!) 301 lb 6.4 oz (136.7 kg)   BMI 42.04 kg/m     Wt Readings from Last 3 Encounters:  12/21/19 (!) 301 lb 6.4 oz (136.7 kg)  12/16/18 281 lb (127.5 kg)  12/29/17 264 lb 3.2 oz (119.8 kg)      GEN:  Well nourished, well developed in no acute distress HEENT: Normal NECK: No JVD; No carotid bruits LYMPHATICS: No lymphadenopathy CARDIAC: RRR, no murmurs, rubs, gallops RESPIRATORY:  Clear to auscultation without rales, wheezing or rhonchi  ABDOMEN: Soft, non-tender, non-distended MUSCULOSKELETAL:  No edema; No deformity  SKIN: Warm and dry NEUROLOGIC:  Alert and oriented x 3 PSYCHIATRIC:  Normal affect   ASSESSMENT:    1. OSA on CPAP   2. Essential hypertension, benign   3. Coronary artery disease involving native coronary artery of native heart without angina pectoris   4. Pure hypercholesterolemia   5. Morbid obesity (HCC)    PLAN:    In order of problems listed above:  1.  OSA - The PAP download was reviewed today and showed an AHI of 0.5/hr on 11 cm H2O with 100% compliance in using more than 4 hours nightly.  The patient has been using and benefiting from PAP use and will continue to benefit from therapy.   2.  Hypertension  -BP is borderline controlled on exam today -continue Lopressor 50mg  BID -would avoid amlodipine due to LE edema -he had a bump in creatinine with HCTZ and was stopped -add chlorthalidone 25mg  daily  -repeat BMET in 1 week -BP check in 2 weeks with PharmD  3.  ASCAD  -s/p NSTEMI with cath showing 99% pRCA s/p DES in 09/2015.  -he has had some left arm pressure with exertion with walking up inclines that is new>>suspect this is related to 20lb weight gain in past year -he has not had an ischemic workup in  3 years since his PCI of his RCA -check Lexiscan myoview to rule out ischemia -continue ASA, Plavix 75mg  daily BB and statin   4.  Hyperlipidemia  -his LDL goal is < 64 a year ago -repeat FLP and ALT -continue Pravastatin 80mg  daily and Zetia 10mg  daily   5.  Morbid Obesity -I have encouraged him to get into a routine exercise program and cut back on carbs and portions.    Medication Adjustments/Labs and Tests Ordered: Current  medicines are reviewed at length with the patient today.  Concerns regarding medicines are outlined above.  No orders of the defined types were placed in this encounter.  No orders of the defined types were placed in this encounter.   Signed, , MD  12/21/2019 3:56 PM    [facility]

## 2020-01-01 ENCOUNTER — Telehealth (HOSPITAL_COMMUNITY): Payer: Self-pay

## 2020-01-01 ENCOUNTER — Encounter (HOSPITAL_COMMUNITY): Payer: Self-pay

## 2020-01-04 ENCOUNTER — Other Ambulatory Visit: Payer: 59 | Admitting: *Deleted

## 2020-01-04 ENCOUNTER — Ambulatory Visit (HOSPITAL_COMMUNITY): Payer: 59 | Attending: Cardiovascular Disease

## 2020-01-04 ENCOUNTER — Other Ambulatory Visit: Payer: Self-pay

## 2020-01-04 DIAGNOSIS — I2510 Atherosclerotic heart disease of native coronary artery without angina pectoris: Secondary | ICD-10-CM | POA: Diagnosis not present

## 2020-01-04 DIAGNOSIS — E7800 Pure hypercholesterolemia, unspecified: Secondary | ICD-10-CM | POA: Diagnosis not present

## 2020-01-04 DIAGNOSIS — I10 Essential (primary) hypertension: Secondary | ICD-10-CM | POA: Diagnosis not present

## 2020-01-04 LAB — LIPID PANEL
Chol/HDL Ratio: 2.9 ratio (ref 0.0–5.0)
Cholesterol, Total: 123 mg/dL (ref 100–199)
HDL: 43 mg/dL (ref >39)
LDL Chol Calc (NIH): 62 mg/dL (ref 0–99)
Triglycerides: 92 mg/dL (ref 0–149)
VLDL Cholesterol Cal: 18 mg/dL (ref 5–40)

## 2020-01-04 LAB — BASIC METABOLIC PANEL
BUN/Creatinine Ratio: 13 (ref 9–20)
BUN: 15 mg/dL (ref 6–24)
CO2: 22 mmol/L (ref 20–29)
Calcium: 9 mg/dL (ref 8.7–10.2)
Chloride: 103 mmol/L (ref 96–106)
Creatinine, Ser: 1.15 mg/dL (ref 0.76–1.27)
GFR calc Af Amer: 82 mL/min/{1.73_m2} (ref >59)
GFR calc non Af Amer: 71 mL/min/{1.73_m2} (ref >59)
Glucose: 114 mg/dL — ABNORMAL HIGH (ref 65–99)
Potassium: 3.6 mmol/L (ref 3.5–5.2)
Sodium: 140 mmol/L (ref 134–144)

## 2020-01-04 LAB — ALT: ALT: 13 IU/L (ref 0–44)

## 2020-01-04 MED ORDER — REGADENOSON 0.4 MG/5ML IV SOLN
0.4000 mg | Freq: Once | INTRAVENOUS | Status: AC
  Administered 2020-01-04: 0.4 mg via INTRAVENOUS

## 2020-01-04 MED ORDER — TECHNETIUM TC 99M TETROFOSMIN IV KIT
31.4000 | PACK | Freq: Once | INTRAVENOUS | Status: AC | PRN
  Administered 2020-01-04: 31.4 via INTRAVENOUS
  Filled 2020-01-04: qty 32

## 2020-01-05 ENCOUNTER — Ambulatory Visit (HOSPITAL_COMMUNITY): Payer: 59 | Attending: Cardiovascular Disease

## 2020-01-05 LAB — MYOCARDIAL PERFUSION IMAGING
LV dias vol: 131 mL (ref 62–150)
LV sys vol: 70 mL
Peak HR: 98 {beats}/min
Rest HR: 58 {beats}/min
SDS: 0
SRS: 0
SSS: 0
TID: 0.96

## 2020-01-05 MED ORDER — TECHNETIUM TC 99M TETROFOSMIN IV KIT
31.4000 | PACK | Freq: Once | INTRAVENOUS | Status: AC | PRN
  Administered 2020-01-05: 31.4 via INTRAVENOUS
  Filled 2020-01-05: qty 32

## 2020-01-10 ENCOUNTER — Telehealth: Payer: Self-pay

## 2020-01-10 DIAGNOSIS — G4733 Obstructive sleep apnea (adult) (pediatric): Secondary | ICD-10-CM

## 2020-01-10 DIAGNOSIS — R9430 Abnormal result of cardiovascular function study, unspecified: Secondary | ICD-10-CM

## 2020-01-10 NOTE — Telephone Encounter (Signed)
-----   Message from Lars Masson, MD sent at 01/08/2020 10:18 AM EDT ----- No ischemia, LVEF estimated at 46%, please order an echocardiogram for LVEF evaluation.

## 2020-01-10 NOTE — Telephone Encounter (Signed)
The patient has been notified of the result and verbalized understanding.  All questions (if any) were answered. Theresia Majors, RN 01/10/2020 11:01 AM  Echocardiogram has been scheduled.

## 2020-01-16 ENCOUNTER — Other Ambulatory Visit (HOSPITAL_COMMUNITY): Payer: 59

## 2020-01-18 ENCOUNTER — Ambulatory Visit (INDEPENDENT_AMBULATORY_CARE_PROVIDER_SITE_OTHER): Payer: 59 | Admitting: Family Medicine

## 2020-01-18 ENCOUNTER — Encounter (INDEPENDENT_AMBULATORY_CARE_PROVIDER_SITE_OTHER): Payer: Self-pay | Admitting: Family Medicine

## 2020-01-18 ENCOUNTER — Other Ambulatory Visit: Payer: Self-pay

## 2020-01-18 VITALS — BP 145/81 | HR 59 | Temp 98.1°F | Ht 71.0 in | Wt 292.0 lb

## 2020-01-18 DIAGNOSIS — G8929 Other chronic pain: Secondary | ICD-10-CM

## 2020-01-18 DIAGNOSIS — Z0289 Encounter for other administrative examinations: Secondary | ICD-10-CM

## 2020-01-18 DIAGNOSIS — I252 Old myocardial infarction: Secondary | ICD-10-CM

## 2020-01-18 DIAGNOSIS — M545 Low back pain, unspecified: Secondary | ICD-10-CM

## 2020-01-18 DIAGNOSIS — Z9189 Other specified personal risk factors, not elsewhere classified: Secondary | ICD-10-CM

## 2020-01-18 DIAGNOSIS — R5383 Other fatigue: Secondary | ICD-10-CM | POA: Diagnosis not present

## 2020-01-18 DIAGNOSIS — E7849 Other hyperlipidemia: Secondary | ICD-10-CM | POA: Diagnosis not present

## 2020-01-18 DIAGNOSIS — I10 Essential (primary) hypertension: Secondary | ICD-10-CM | POA: Diagnosis not present

## 2020-01-18 DIAGNOSIS — R0602 Shortness of breath: Secondary | ICD-10-CM

## 2020-01-18 DIAGNOSIS — Z9989 Dependence on other enabling machines and devices: Secondary | ICD-10-CM

## 2020-01-18 DIAGNOSIS — Z6841 Body Mass Index (BMI) 40.0 and over, adult: Secondary | ICD-10-CM

## 2020-01-18 DIAGNOSIS — Z1331 Encounter for screening for depression: Secondary | ICD-10-CM

## 2020-01-18 DIAGNOSIS — G4733 Obstructive sleep apnea (adult) (pediatric): Secondary | ICD-10-CM

## 2020-01-18 DIAGNOSIS — R739 Hyperglycemia, unspecified: Secondary | ICD-10-CM

## 2020-01-18 DIAGNOSIS — K5909 Other constipation: Secondary | ICD-10-CM

## 2020-01-18 NOTE — Progress Notes (Signed)
Dear Dr. Mayford Knife,   Thank you for referring Raymond Jordan. to our clinic. The following note includes my evaluation and treatment recommendations.  Chief Complaint:   OBESITY Raymond Jordan (MR# [id number]) is a 59 y.o. male who presents for evaluation and treatment of obesity and related comorbidities. Current BMI is Body mass index is 40.73 kg/m. Raymond Jordan has been struggling with his weight for many years and has been unsuccessful in either losing weight, maintaining weight loss, or reaching his healthy weight goal.  Raymond Jordan is currently in the action stage of change and ready to dedicate time achieving and maintaining a healthier weight. Raymond Jordan is interested in becoming our patient and working on intensive lifestyle modifications including (but not limited to) diet and exercise for weight loss.  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  He is married.  His wife is Raymond Jordan, age 55.  Raymond Jordan provided the following food recall today:  Breakfast:  Oatmeal, fruit, banana. Lunch:  Leftovers or sandwich. Dinner:  Eats out or meat and vegetables. He says he has a sweet tooth for Maxi Bs.  Raymond Jordan's habits were reviewed today and are as follows: His family eats meals together, he thinks his family will eat healthier with him, he struggles with family and or coworkers weight loss sabotage, his desired weight loss is 110 pounds, he started gaining weight in 2000 and again in 2020, his heaviest weight ever was 300 pounds, he craves burgers, seafood, chicken, and sweets, he is frequently drinking liquids with calories and he struggles with emotional eating.  Depression Screen Raymond Jordan's Food and Mood (modified PHQ-9) score was 8.  Depression screen PHQ 2/9 01/18/2020  Decreased Interest 1  Down, Depressed, Hopeless 1  PHQ - 2 Score 2  Altered sleeping 3  Tired, decreased energy 1  Change in appetite (No Data)  Feeling bad or failure about yourself  0  Trouble concentrating 1  Moving  slowly or fidgety/restless 1  Suicidal thoughts 0  PHQ-9 Score 8  Difficult doing work/chores Not difficult at all   Subjective:   1. Other fatigue Raymond Jordan denies daytime somnolence and reports waking up still tired. Patent has a history of symptoms of daytime fatigue and morning fatigue. Raymond Jordan generally gets 5 or 6 hours of sleep per night, and states that he has generally restful sleep. Snoring is not present. Apneic episodes are not present. Epworth Sleepiness Score is 5.  2. SOB (shortness of breath) on exertion Raymond Jordan notes increasing shortness of breath with exercising and seems to be worsening over time with weight gain. He notes getting out of breath sooner with activity than he used to. This has gotten worse recently. Raymond Jordan denies shortness of breath at rest or orthopnea.  3. Essential hypertension Review: taking medications as instructed, no medication side effects noted, no chest pain on exertion, no dyspnea on exertion, no swelling of ankles.  He is taking metoprolol, chlorthalidone, and losartan for blood pressure control.  BP Readings from Last 3 Encounters:  01/18/20 (!) 145/81  12/21/19 (!) 148/92  12/16/18 (!) 148/81   4. Other hyperlipidemia Raymond Jordan has hyperlipidemia and has been trying to improve his cholesterol levels with intensive lifestyle modification including a low saturated fat diet, exercise and weight loss. He denies any chest pain, claudication or myalgias.  He is taking pravastatin and ezetimibe for cholesterol.  Lab Results  Component Value Date   ALT 13 01/04/2020   AST 17 01/13/2019   ALKPHOS 74 01/13/2019  BILITOT 0.3 01/13/2019   Lab Results  Component Value Date   CHOL 123 01/04/2020   HDL 43 01/04/2020   LDLCALC 62 01/04/2020   TRIG 92 01/04/2020   CHOLHDL 2.9 01/04/2020   5. OSA on CPAP Raymond Jordan has a diagnosis of sleep apnea. He reports that he is using a CPAP regularly.   6. Chronic low back pain, unspecified back pain laterality,  unspecified whether sciatica present Raymond Jordan has low back pain at the L4-L5 level.  7. Other constipation Raymond Jordan notes constipation.   8. History of MI (myocardial infarction) Raymond Jordan has history of MI with stent placement.  He is on aspirin and Plavix.  9. Hyperglycemia Raymond Jordan has a history of some elevated blood glucose readings without a diagnosis of diabetes. He denies polyphagia.  10. Depression screening Raymond Jordan was screened for depression as part of his new patient workup.  PHQ-9 is 8.  11. At risk for deficient intake of food The patient is at a higher than average risk of deficient intake of food.  Assessment/Plan:   1. Other fatigue Raymond Jordan does feel that his weight is causing his energy to be lower than it should be. Fatigue may be related to obesity, depression or many other causes. Labs will be ordered, and in the meanwhile, Raymond Jordan will focus on self care including making healthy food choices, increasing physical activity and focusing on stress reduction.  Orders - T3 - T4, free - TSH - VITAMIN D 25 Hydroxy (Vit-D Deficiency, Fractures) - Vitamin B12  2. SOB (shortness of breath) on exertion Raymond Jordan does feel that he gets out of breath more easily that he used to when he exercises. Raymond Jordan's shortness of breath appears to be obesity related and exercise induced. He has agreed to work on weight loss and gradually increase exercise to treat his exercise induced shortness of breath. Will continue to monitor closely.  3. Essential hypertension Raymond Jordan is working on healthy weight loss and exercise to improve blood pressure control. We will watch for signs of hypotension as he continues his lifestyle modifications.  Orders - CBC with Differential/Platelet - Comprehensive metabolic panel  4. Other hyperlipidemia Cardiovascular risk and specific lipid/LDL goals reviewed.  We discussed several lifestyle modifications today and Raymond Jordan will continue to work on diet, exercise and  weight loss efforts. Orders and follow up as documented in patient record.   Counseling Intensive lifestyle modifications are the first line treatment for this issue. . Dietary changes: Increase soluble fiber. Decrease simple carbohydrates. . Exercise changes: Moderate to vigorous-intensity aerobic activity 150 minutes per week if tolerated. . Lipid-lowering medications: see documented in medical record.  5. OSA on CPAP Intensive lifestyle modifications are the first line treatment for this issue. We discussed several lifestyle modifications today and he will continue to work on diet, exercise and weight loss efforts. We will continue to monitor. Orders and follow up as documented in patient record.   Counseling  Sleep apnea is a condition in which breathing pauses or becomes shallow during sleep. This happens over and over during the night. This disrupts your sleep and keeps your body from getting the rest that it needs, which can cause tiredness and lack of energy (fatigue) during the day.  Sleep apnea treatment: If you were given a device to open your airway while you sleep, USE IT!  Sleep hygiene:   Limit or avoid alcohol, caffeinated beverages, and cigarettes, especially close to bedtime.   Do not eat a large meal or eat spicy foods right  before bedtime. This can lead to digestive discomfort that can make it hard for you to sleep.  Keep a sleep diary to help you and your health care provider figure out what could be causing your insomnia.  . Make your bedroom a dark, comfortable place where it is easy to fall asleep. ? Put up shades or blackout curtains to block light from outside. ? Use a white noise machine to block noise. ? Keep the temperature cool. . Limit screen use before bedtime. This includes: ? Watching TV. ? Using your smartphone, tablet, or computer. . Stick to a routine that includes going to bed and waking up at the same times every day and night. This can help you  fall asleep faster. Consider making a quiet activity, such as reading, part of your nighttime routine. . Try to avoid taking naps during the day so that you sleep better at night. . Get out of bed if you are still awake after 15 minutes of trying to sleep. Keep the lights down, but try reading or doing a quiet activity. When you feel sleepy, go back to bed.  6. Chronic low back pain, unspecified back pain laterality, unspecified whether sciatica present Will continue to monitor.  7. Other constipation Raymond Jordan was informed that a decrease in bowel movement frequency is normal while losing weight, but stools should not be hard or painful. Orders and follow up as documented in patient record.   Counseling Getting to Good Bowel Health: Your goal is to have one soft bowel movement each day. Drink at least 8 glasses of water each day. Eat plenty of fiber (goal is over 25 grams each day). It is best to get most of your fiber from dietary sources which includes leafy green vegetables, fresh fruit, and whole grains. You may need to add fiber with the help of OTC fiber supplements. These include Metamucil, Citrucel, and Flaxseed. If you are still having trouble, try adding Miralax or Magnesium Citrate. If all of these changes do not work, Dietitiancontact your physician.  8. History of MI (myocardial infarction) Followed by Cardiology for this problem. Those encounter notes were reviewed.  9. Hyperglycemia Fasting labs will be obtained and results with be discussed with Raymond Jordan in 2 weeks at his follow up visit. In the meanwhile Raymond Jordan was started on a lower simple carbohydrate diet and will work on weight loss efforts.  Orders - Hemoglobin A1c - Insulin, random  10. Depression screening Raymond Jordan had a positive depression screening. Depression is commonly associated with obesity and often results in emotional eating behaviors. We will monitor this closely and work on CBT to help improve the non-hunger eating  patterns. Referral to Psychology may be required if no improvement is seen as he continues in our clinic.  11. At risk for deficient intake of food Raymond Jordan was given approximately 15 minutes of deficit intake of food prevention counseling today. Raymond Jordan is at risk for eating too few calories based on current food recall. He was encouraged to focus on meeting caloric and protein goals according to his recommended meal plan.   12. Class 3 severe obesity with serious comorbidity and body mass index (BMI) of 40.0 to 44.9 in adult, unspecified obesity type [facility]CC) Raymond Jordan is currently in the action stage of change and his goal is to continue with weight loss efforts. I recommend Raymond Jordan begin the structured treatment plan as follows:  He has agreed to the Category 3 Plan.  Exercise goals: No exercise has been prescribed  at this time.   Behavioral modification strategies: increasing lean protein intake, decreasing simple carbohydrates, increasing vegetables, increasing water intake, decreasing liquid calories, decreasing alcohol intake, decreasing sodium intake and increasing high fiber foods.  He was informed of the importance of frequent follow-up visits to maximize his success with intensive lifestyle modifications for his multiple health conditions. He was informed we would discuss his lab results at his next visit unless there is a critical issue that needs to be addressed sooner. Raymond Jordan agreed to keep his next visit at the agreed upon time to discuss these results.  Objective:   Blood pressure (!) 145/81, pulse (!) 59, temperature 98.1 F (36.7 C), temperature source Oral, height 5\' 11"  (1.803 m), weight 292 lb (132.5 kg), SpO2 99 %. Body mass index is 40.73 kg/m.  Indirect Calorimeter completed today shows a VO2 of 300 and a REE of 2089.  His calculated basal metabolic rate is 2090 thus his basal metabolic rate is worse than expected.  General: Cooperative, alert, well developed, in no acute  distress. HEENT: Conjunctivae and lids unremarkable. Cardiovascular: Regular rhythm.  Lungs: Normal work of breathing. Neurologic: No focal deficits.   Lab Results  Component Value Date   CREATININE 1.15 01/04/2020   BUN 15 01/04/2020   NA 140 01/04/2020   K 3.6 01/04/2020   CL 103 01/04/2020   CO2 22 01/04/2020   Lab Results  Component Value Date   ALT 13 01/04/2020   AST 17 01/13/2019   ALKPHOS 74 01/13/2019   BILITOT 0.3 01/13/2019   Lab Results  Component Value Date   TSH 1.156 09/14/2015   Lab Results  Component Value Date   CHOL 123 01/04/2020   HDL 43 01/04/2020   LDLCALC 62 01/04/2020   TRIG 92 01/04/2020   CHOLHDL 2.9 01/04/2020   Lab Results  Component Value Date   WBC 6.8 01/13/2017   HGB 12.0 (L) 01/13/2017   HCT 34.4 (L) 01/13/2017   MCV 81 01/13/2017   PLT 185 01/13/2017   Attestation Statements:   This is the patient's first visit at [facility]. The patient's NEW PATIENT PACKET was reviewed at length. Included in the packet: current and past health history, medications, allergies, ROS, gynecologic history (women only), surgical history, family history, social history, weight history, weight loss surgery history (for those that have had weight loss surgery), nutritional evaluation, mood and food questionnaire, PHQ9, Epworth questionnaire, sleep habits questionnaire, patient life and health improvement goals questionnaire. These will all be scanned into the patient's chart under media.   During the visit, I independently reviewed the patient's EKG, bioimpedance scale results, and indirect calorimeter results. I used this information to tailor a meal plan for the patient that will help him to lose weight and will improve his obesity-related conditions going forward. I performed a medically necessary appropriate examination and/or evaluation. I discussed the assessment and treatment plan with the patient. The patient was provided an  opportunity to ask questions and all were answered. The patient agreed with the plan and demonstrated an understanding of the instructions. Labs were ordered at this visit and will be reviewed at the next visit unless more critical results need to be addressed immediately. Clinical information was updated and documented in the EMR.   I, 03/16/2017, CMA, am acting as transcriptionist for Insurance claims handler, DO  I have reviewed the above documentation for accuracy and completeness, and I agree with the above. Helane Rima, DO

## 2020-01-19 LAB — COMPREHENSIVE METABOLIC PANEL
ALT: 15 IU/L (ref 0–44)
AST: 22 IU/L (ref 0–40)
Albumin/Globulin Ratio: 1.3 (ref 1.2–2.2)
Albumin: 4.2 g/dL (ref 3.8–4.9)
Alkaline Phosphatase: 74 IU/L (ref 48–121)
BUN/Creatinine Ratio: 14 (ref 9–20)
BUN: 16 mg/dL (ref 6–24)
Bilirubin Total: 0.3 mg/dL (ref 0.0–1.2)
CO2: 27 mmol/L (ref 20–29)
Calcium: 9 mg/dL (ref 8.7–10.2)
Chloride: 103 mmol/L (ref 96–106)
Creatinine, Ser: 1.18 mg/dL (ref 0.76–1.27)
GFR calc Af Amer: 79 mL/min/{1.73_m2} (ref >59)
GFR calc non Af Amer: 69 mL/min/{1.73_m2} (ref >59)
Globulin, Total: 3.3 g/dL (ref 1.5–4.5)
Glucose: 95 mg/dL (ref 65–99)
Potassium: 3.7 mmol/L (ref 3.5–5.2)
Sodium: 139 mmol/L (ref 134–144)
Total Protein: 7.5 g/dL (ref 6.0–8.5)

## 2020-01-19 LAB — CBC WITH DIFFERENTIAL/PLATELET
Basophils Absolute: 0 10*3/uL (ref 0.0–0.2)
Basos: 0 %
EOS (ABSOLUTE): 0.1 10*3/uL (ref 0.0–0.4)
Eos: 1 %
Hematocrit: 37.4 % — ABNORMAL LOW (ref 37.5–51.0)
Hemoglobin: 12.5 g/dL — ABNORMAL LOW (ref 13.0–17.7)
Immature Grans (Abs): 0 10*3/uL (ref 0.0–0.1)
Immature Granulocytes: 0 %
Lymphocytes Absolute: 2.3 10*3/uL (ref 0.7–3.1)
Lymphs: 39 %
MCH: 27.2 pg (ref 26.6–33.0)
MCHC: 33.4 g/dL (ref 31.5–35.7)
MCV: 82 fL (ref 79–97)
Monocytes Absolute: 0.6 10*3/uL (ref 0.1–0.9)
Monocytes: 11 %
Neutrophils Absolute: 2.9 10*3/uL (ref 1.4–7.0)
Neutrophils: 49 %
Platelets: 178 10*3/uL (ref 150–450)
RBC: 4.59 x10E6/uL (ref 4.14–5.80)
RDW: 18 % — ABNORMAL HIGH (ref 11.6–15.4)
WBC: 5.8 10*3/uL (ref 3.4–10.8)

## 2020-01-19 LAB — TSH: TSH: 2.04 u[IU]/mL (ref 0.450–4.500)

## 2020-01-19 LAB — VITAMIN B12: Vitamin B-12: 813 pg/mL (ref 232–1245)

## 2020-01-19 LAB — T4, FREE: Free T4: 1.01 ng/dL (ref 0.82–1.77)

## 2020-01-19 LAB — HEMOGLOBIN A1C
Est. average glucose Bld gHb Est-mCnc: 131 mg/dL
Hgb A1c MFr Bld: 6.2 % — ABNORMAL HIGH (ref 4.8–5.6)

## 2020-01-19 LAB — INSULIN, RANDOM: INSULIN: 27.9 u[IU]/mL — ABNORMAL HIGH (ref 2.6–24.9)

## 2020-01-19 LAB — T3: T3, Total: 123 ng/dL (ref 71–180)

## 2020-01-19 LAB — VITAMIN D 25 HYDROXY (VIT D DEFICIENCY, FRACTURES): Vit D, 25-Hydroxy: 22.1 ng/mL — ABNORMAL LOW (ref 30.0–100.0)

## 2020-01-21 ENCOUNTER — Other Ambulatory Visit: Payer: Self-pay | Admitting: Cardiology

## 2020-01-30 ENCOUNTER — Other Ambulatory Visit (HOSPITAL_COMMUNITY): Payer: 59

## 2020-02-01 ENCOUNTER — Ambulatory Visit (INDEPENDENT_AMBULATORY_CARE_PROVIDER_SITE_OTHER): Payer: 59 | Admitting: Family Medicine

## 2020-02-07 ENCOUNTER — Encounter (INDEPENDENT_AMBULATORY_CARE_PROVIDER_SITE_OTHER): Payer: Self-pay | Admitting: Family Medicine

## 2020-02-07 ENCOUNTER — Other Ambulatory Visit: Payer: Self-pay

## 2020-02-07 ENCOUNTER — Ambulatory Visit (HOSPITAL_COMMUNITY): Payer: 59 | Attending: Cardiovascular Disease

## 2020-02-07 ENCOUNTER — Ambulatory Visit (INDEPENDENT_AMBULATORY_CARE_PROVIDER_SITE_OTHER): Payer: 59 | Admitting: Family Medicine

## 2020-02-07 VITALS — BP 131/75 | HR 51 | Temp 98.2°F | Ht 71.0 in | Wt 283.0 lb

## 2020-02-07 DIAGNOSIS — E559 Vitamin D deficiency, unspecified: Secondary | ICD-10-CM

## 2020-02-07 DIAGNOSIS — Z9189 Other specified personal risk factors, not elsewhere classified: Secondary | ICD-10-CM | POA: Diagnosis not present

## 2020-02-07 DIAGNOSIS — R7303 Prediabetes: Secondary | ICD-10-CM | POA: Diagnosis not present

## 2020-02-07 DIAGNOSIS — R9430 Abnormal result of cardiovascular function study, unspecified: Secondary | ICD-10-CM | POA: Diagnosis not present

## 2020-02-07 DIAGNOSIS — Z6839 Body mass index (BMI) 39.0-39.9, adult: Secondary | ICD-10-CM

## 2020-02-07 LAB — ECHOCARDIOGRAM COMPLETE
Area-P 1/2: 2.49 cm2
S' Lateral: 3.4 cm

## 2020-02-08 NOTE — Progress Notes (Signed)
Chief Complaint:   OBESITY Jordan is here to discuss his progress with his obesity treatment plan along with follow-up of his obesity related diagnoses. Raymond Jordan is on the Category 3 Plan and states he is following his eating plan approximately 95-98% of the time. Raymond Jordan states he is walking for 2 miles and doing yard work 5 times per week.  Today's visit was #: 2 Starting weight: 292 lbs Starting date: 01/18/2020 Today's weight: 283 lbs Today's date: 02/07/2020 Total lbs lost to date: 9 lbs Total lbs lost since last in-office visit: 9 lbs  Interim History: Raymond Jordan says he has no more chest pressure or left arm pain with walking uphill.  He had an echo done today, and we reviewed it together.  He says he is happy with the meal plan.  He denies hunger and denies constipation.  We reviewed Wegovy as a future medication option. Labs reviewed together today.  Subjective:   1. Prediabetes  Lab Results  Component Value Date   HGBA1C 6.2 (H) 01/18/2020   Lab Results  Component Value Date   INSULIN 27.9 (H) 01/18/2020   2. Vitamin D deficiency Raymond Jordan's Vitamin D level was 22.1 on 01/18/2020. He is currently taking prescription vitamin D 50,000 IU each week.   3. At risk for constipation Raymond Jordan is at increased risk for constipation due to a change in diet to high protein.  Assessment/Plan:   1. Prediabetes Not at goal. Goal is HgbA1c < 5.7 and insulin level closer to 5. Raymond Jordan will continue to work on weight loss, exercise, and decreasing simple carbohydrates to help decrease the risk of diabetes.   2. Vitamin D deficiency Not at goal. Goal is > 50. Low Vitamin D level contributes to fatigue and are associated with obesity, breast, and colon cancer. He agrees to continue to take prescription Vitamin D @50 ,000 IU every week and will follow-up for routine testing of Vitamin D, at least 2-3 times per year to avoid over-replacement.  3. At risk for constipation Raymond Jordan was given  approximately 15 minutes of counseling today regarding prevention of constipation. He was encouraged to increase water and fiber intake.   4. Class 2 severe obesity with serious comorbidity and body mass index (BMI) of 39.0 to 39.9 in adult, unspecified obesity type Raymond Jordan) Raymond Jordan is currently in the action stage of change. As such, his goal is to continue with weight loss efforts. He has agreed to the Category 3 Plan.   Exercise goals: For substantial health benefits, adults should do at least 150 minutes (2 hours and 30 minutes) a week of moderate-intensity, or 75 minutes (1 hour and 15 minutes) a week of vigorous-intensity aerobic physical activity, or an equivalent combination of moderate- and vigorous-intensity aerobic activity. Aerobic activity should be performed in episodes of at least 10 minutes, and preferably, it should be spread throughout the week.  Behavioral modification strategies: increasing lean protein intake.  Raymond Jordan has agreed to follow-up with our clinic in 3 weeks. He was informed of the importance of frequent follow-up visits to Raymond Jordan his success with intensive lifestyle modifications for his multiple health conditions.   Objective:   Blood pressure 131/75, pulse (!) 51, temperature 98.2 F (36.8 C), temperature source Oral, height 5\' 11"  (1.803 m), weight 283 lb (128.4 kg), SpO2 99 %. Body mass index is 39.47 kg/m.  General: Cooperative, alert, well developed, in no acute distress. HEENT: Conjunctivae and lids unremarkable. Cardiovascular: Regular rhythm.  Lungs: Normal work of breathing. Neurologic: No  focal deficits.   Lab Results  Component Value Date   CREATININE 1.18 01/18/2020   BUN 16 01/18/2020   NA 139 01/18/2020   K 3.7 01/18/2020   CL 103 01/18/2020   CO2 27 01/18/2020   Lab Results  Component Value Date   ALT 15 01/18/2020   AST 22 01/18/2020   ALKPHOS 74 01/18/2020   BILITOT 0.3 01/18/2020   Lab Results  Component Value Date   HGBA1C  6.2 (H) 01/18/2020   Lab Results  Component Value Date   INSULIN 27.9 (H) 01/18/2020   Lab Results  Component Value Date   TSH 2.040 01/18/2020   Lab Results  Component Value Date   CHOL 123 01/04/2020   HDL 43 01/04/2020   LDLCALC 62 01/04/2020   TRIG 92 01/04/2020   CHOLHDL 2.9 01/04/2020   Lab Results  Component Value Date   WBC 5.8 01/18/2020   HGB 12.5 (L) 01/18/2020   HCT 37.4 (L) 01/18/2020   MCV 82 01/18/2020   PLT 178 01/18/2020   Attestation Statements:   Reviewed by clinician on day of visit: allergies, medications, problem list, medical history, surgical history, family history, social history, and previous encounter notes.  I, Insurance claims handler, CMA, am acting as transcriptionist for Helane Rima, DO  I have reviewed the above documentation for accuracy and completeness, and I agree with the above. Helane Rima, DO

## 2020-02-13 ENCOUNTER — Other Ambulatory Visit: Payer: Self-pay

## 2020-02-13 MED ORDER — CHLORTHALIDONE 25 MG PO TABS: 25.0000 mg | ORAL_TABLET | Freq: Every day | ORAL | 3 refills | Status: DC

## 2020-02-21 ENCOUNTER — Ambulatory Visit (INDEPENDENT_AMBULATORY_CARE_PROVIDER_SITE_OTHER): Payer: 59 | Admitting: Family Medicine

## 2020-02-21 ENCOUNTER — Other Ambulatory Visit: Payer: Self-pay

## 2020-02-21 ENCOUNTER — Encounter (INDEPENDENT_AMBULATORY_CARE_PROVIDER_SITE_OTHER): Payer: Self-pay | Admitting: Family Medicine

## 2020-02-21 VITALS — BP 117/71 | HR 54 | Temp 98.2°F | Ht 71.0 in | Wt 278.0 lb

## 2020-02-21 DIAGNOSIS — G4733 Obstructive sleep apnea (adult) (pediatric): Secondary | ICD-10-CM | POA: Diagnosis not present

## 2020-02-21 DIAGNOSIS — Z6838 Body mass index (BMI) 38.0-38.9, adult: Secondary | ICD-10-CM

## 2020-02-21 DIAGNOSIS — Z9189 Other specified personal risk factors, not elsewhere classified: Secondary | ICD-10-CM | POA: Diagnosis not present

## 2020-02-21 DIAGNOSIS — R7303 Prediabetes: Secondary | ICD-10-CM

## 2020-02-21 DIAGNOSIS — I252 Old myocardial infarction: Secondary | ICD-10-CM | POA: Diagnosis not present

## 2020-02-21 DIAGNOSIS — E559 Vitamin D deficiency, unspecified: Secondary | ICD-10-CM | POA: Diagnosis not present

## 2020-02-21 DIAGNOSIS — E65 Localized adiposity: Secondary | ICD-10-CM

## 2020-02-21 DIAGNOSIS — Z9989 Dependence on other enabling machines and devices: Secondary | ICD-10-CM

## 2020-02-21 NOTE — Progress Notes (Signed)
Chief Complaint:   OBESITY Raymond Jordan is here to discuss his progress with his obesity treatment plan along with follow-up of his obesity related diagnoses. Raymond Jordan is on the Category 3 Plan and states he is following his eating plan approximately 90% of the time. Raymond Jordan states he is walking 1 mile + 5 times per week.  Today's visit was #: 3 Starting weight: 292 lbs Starting date: 01/18/2020 Today's weight: 278 lbs Today's date: 02/21/2020 Total lbs lost to date: 14 lbs Total lbs lost since last in-office visit: 5 lbs Total weight loss percentage to date: -4.79%  Interim History: Raymond Jordan is down 14 pounds total.  He has increased the amount of walking he has been doing.  He says he has better exercise tolerance and less back pain.  Denies polyphagia.  Subjective:   1. Vitamin D deficiency Tab's Vitamin D level was 22.1 on 01/18/2020. He is currently taking no vitamin D supplement.  2. Prediabetes Raymond Jordan has a diagnosis of prediabetes based on his elevated HgA1c and was informed this puts him at greater risk of developing diabetes. He continues to work on diet and exercise to decrease his risk of diabetes. He denies nausea or hypoglycemia.  Discussed medication options.   Lab Results  Component Value Date   HGBA1C 6.2 (H) 01/18/2020   Lab Results  Component Value Date   INSULIN 27.9 (H) 01/18/2020   3. OSA on CPAP Raymond Jordan has a diagnosis of sleep apnea. He reports that he is using a CPAP regularly.  He is compliant with CPAP usage.   4. History of MI (myocardial infarction) Raymond Jordan has history of MI with stent placement.  5. Visceral obesity Raymond Jordan's visceral fat rating is 22.  6. At risk for complication associated with hypotension The patient is at a higher than average risk of hypotension due to weight loss.  Assessment/Plan:   1. Vitamin D deficiency Not at goal. Optimal goal > 50 ng/dL. There is also evidence to support a goal of >70 ng/dL in patients with cancer  and heart disease. Plan: Continue Vitamin D @50 ,000 IU every week with follow-up for routine testing of Vitamin D at least 2-3 times per year to avoid over-replacement.  Meds ordered this encounter  Medications  . Vitamin D, Ergocalciferol, (DRISDOL) 1.25 MG (50000 UNIT) CAPS capsule    Sig: Take 1 capsule (50,000 Units total) by mouth every 7 (seven) days.    Dispense:  4 capsule    Refill:  0   2. Prediabetes Not optimized. Food choices and timing of food intake reviewed to alleviate symptoms.Raymond Jordan will continue to work on weight loss, exercise, and decreasing simple carbohydrates to help decrease the risk of diabetes.  Will hold on medication for now.  3. OSA on CPAP Intensive lifestyle modifications are the first line treatment for this issue. We discussed several lifestyle modifications today and he will continue to work on diet, exercise and weight loss efforts. We will continue to monitor. Orders and follow up as documented in patient record.   4. History of MI (myocardial infarction) Followed by Cardiology for this problem. Those encounter notes were reviewed.  5. Visceral obesity Visceral adipose tissue is a hormonally active component of total body fat. This body composition phenotype is associated with medical disorders such as metabolic syndrome, cardiovascular disease and several malignancies including prostate, breast, and colorectal cancers.   6. At risk for complication associated with hypotension Raymond Jordan was given approximately 15 minutes of education and counseling today to  help avoid hypotension. We discussed risks of hypotension with weight loss and signs of hypotension such as feeling lightheaded or unsteady.  Repetitive spaced learning was employed today to elicit superior memory formation and behavioral change.  7. Class 2 severe obesity with serious comorbidity and body mass index (BMI) of 38.0 to 38.9 in adult, unspecified obesity type Raymond Jordan) Raymond Jordan is currently in  the action stage of change. As such, his goal is to continue with weight loss efforts. He has agreed to the Category 3 Plan.   Exercise goals: For substantial health benefits, adults should do at least 150 minutes (2 hours and 30 minutes) a week of moderate-intensity, or 75 minutes (1 hour and 15 minutes) a week of vigorous-intensity aerobic physical activity, or an equivalent combination of moderate- and vigorous-intensity aerobic activity. Aerobic activity should be performed in episodes of at least 10 minutes, and preferably, it should be spread throughout the week.  Behavioral modification strategies: increasing lean protein intake, decreasing simple carbohydrates and increasing water intake.  Raymond Jordan has agreed to follow-up with our clinic in 2-3 weeks. He was informed of the importance of frequent follow-up visits to maximize his success with intensive lifestyle modifications for his multiple health conditions.   Objective:   Blood pressure 117/71, pulse (!) 54, temperature 98.2 F (36.8 C), temperature source Oral, height 5\' 11"  (1.803 m), weight 278 lb (126.1 kg), SpO2 98 %. Body mass index is 38.77 kg/m.  General: Cooperative, alert, well developed, in no acute distress. HEENT: Conjunctivae and lids unremarkable. Cardiovascular: Regular Raymond Jordan.  Lungs: Normal work of breathing. Neurologic: No focal deficits.   Lab Results  Component Value Date   CREATININE 1.18 01/18/2020   BUN 16 01/18/2020   NA 139 01/18/2020   K 3.7 01/18/2020   CL 103 01/18/2020   CO2 27 01/18/2020   Lab Results  Component Value Date   ALT 15 01/18/2020   AST 22 01/18/2020   ALKPHOS 74 01/18/2020   BILITOT 0.3 01/18/2020   Lab Results  Component Value Date   HGBA1C 6.2 (H) 01/18/2020   Lab Results  Component Value Date   INSULIN 27.9 (H) 01/18/2020   Lab Results  Component Value Date   TSH 2.040 01/18/2020   Lab Results  Component Value Date   CHOL 123 01/04/2020   HDL 43 01/04/2020    LDLCALC 62 01/04/2020   TRIG 92 01/04/2020   CHOLHDL 2.9 01/04/2020   Lab Results  Component Value Date   WBC 5.8 01/18/2020   HGB 12.5 (L) 01/18/2020   HCT 37.4 (L) 01/18/2020   MCV 82 01/18/2020   PLT 178 01/18/2020   Attestation Statements:   Reviewed by clinician on day of visit: allergies, medications, problem list, medical history, surgical history, family history, social history, and previous encounter notes.  I, 01/20/2020, CMA, am acting as transcriptionist for Insurance claims handler, DO  I have reviewed the above documentation for accuracy and completeness, and I agree with the above. Helane Rima, DO

## 2020-02-27 ENCOUNTER — Ambulatory Visit (INDEPENDENT_AMBULATORY_CARE_PROVIDER_SITE_OTHER): Payer: 59 | Admitting: Adult Health

## 2020-02-29 MED ORDER — VITAMIN D (ERGOCALCIFEROL) 1.25 MG (50000 UNIT) PO CAPS: 50000.0000 [IU] | ORAL_CAPSULE | ORAL | 0 refills | Status: DC

## 2020-03-19 ENCOUNTER — Other Ambulatory Visit: Payer: Self-pay | Admitting: Cardiology

## 2020-03-19 DIAGNOSIS — E7800 Pure hypercholesterolemia, unspecified: Secondary | ICD-10-CM

## 2020-03-20 ENCOUNTER — Other Ambulatory Visit: Payer: Self-pay

## 2020-03-20 ENCOUNTER — Encounter (INDEPENDENT_AMBULATORY_CARE_PROVIDER_SITE_OTHER): Payer: Self-pay | Admitting: Family Medicine

## 2020-03-20 ENCOUNTER — Ambulatory Visit (INDEPENDENT_AMBULATORY_CARE_PROVIDER_SITE_OTHER): Payer: 59 | Admitting: Family Medicine

## 2020-03-20 VITALS — BP 118/72 | HR 52 | Temp 98.1°F | Ht 71.0 in | Wt 278.0 lb

## 2020-03-20 DIAGNOSIS — E559 Vitamin D deficiency, unspecified: Secondary | ICD-10-CM | POA: Insufficient documentation

## 2020-03-20 DIAGNOSIS — R7303 Prediabetes: Secondary | ICD-10-CM | POA: Insufficient documentation

## 2020-03-20 DIAGNOSIS — E65 Localized adiposity: Secondary | ICD-10-CM

## 2020-03-20 DIAGNOSIS — E7800 Pure hypercholesterolemia, unspecified: Secondary | ICD-10-CM

## 2020-03-20 DIAGNOSIS — Z6838 Body mass index (BMI) 38.0-38.9, adult: Secondary | ICD-10-CM

## 2020-03-20 DIAGNOSIS — Z9189 Other specified personal risk factors, not elsewhere classified: Secondary | ICD-10-CM | POA: Diagnosis not present

## 2020-03-20 DIAGNOSIS — I2510 Atherosclerotic heart disease of native coronary artery without angina pectoris: Secondary | ICD-10-CM

## 2020-03-20 MED ORDER — VITAMIN D (ERGOCALCIFEROL) 1.25 MG (50000 UNIT) PO CAPS
50000.0000 [IU] | ORAL_CAPSULE | ORAL | 0 refills | Status: DC
Start: 2020-03-20 — End: 2020-06-24

## 2020-03-20 NOTE — Progress Notes (Signed)
Chief Complaint:   OBESITY Raymond Jordan is here to discuss his progress with his obesity treatment plan along with follow-up of his obesity related diagnoses. Raymond Jordan is on the Category 3 Plan and states he is following his eating plan approximately 80% of the time. Raymond Jordan states he is walking for 20 minutes 2-3 times per week.  Today's visit was #: 4 Starting weight: 292 lbs Starting date: 01/18/2020 Today's weight: 278 lbs Today's date: 03/20/2020 Total lbs lost to date: 14 lbs Total lbs lost since last in-office visit: 0 Total weight loss percentage to date: -4.79%  Interim History: Raymond Jordan has gotten a new job since last visit and says he has a lot of modules to complete.  He has also had some celebrations/holiday. He admits to going off plan, but is ready to get back. Challenge:  The weather is changing.  He will need to be diligent about exercise if he is unable to get outside.  Assessment/Plan:   1. Prediabetes Not optimized. Goal is HgbA1c < 5.7 and insulin level closer to 5. Medication: None. Exercise: Walking. The current medical regimen is effective;  continue present plan and medications.  Lab Results  Component Value Date   HGBA1C 6.2 (H) 01/18/2020   Lab Results  Component Value Date   LDLCALC 62 01/04/2020   CREATININE 1.18 01/18/2020   2. Vitamin D deficiency Current vitamin D is 22.1, tested on 01/18/2020. Not at goal. Optimal goal > 50 ng/dL. There is also evidence to support a goal of >70 ng/dL in patients with cancer and heart disease. Plan: Continue Vitamin D @50 ,000 IU every week with follow-up for routine testing of Vitamin D at least 2-3 times per year to avoid over-replacement.  - Refill Vitamin D, Ergocalciferol, (DRISDOL) 1.25 MG (50000 UNIT) CAPS capsule; Take 1 capsule (50,000 Units total) by mouth every 7 (seven) days.  Dispense: 12 capsule; Refill: 0  3. Visceral obesity Visceral adipose tissue is a hormonally active component of total body fat. This  body composition phenotype is associated with medical disorders such as metabolic syndrome, cardiovascular disease and several malignancies including prostate, breast, and colorectal cancers. Goal: Lose 7-10% of starting weight. Visceral fat rating should be < 13.  4. Pure hypercholesterolemia  Lab Results  Component Value Date   CHOL 123 01/04/2020   HDL 43 01/04/2020   LDLCALC 62 01/04/2020   TRIG 92 01/04/2020   CHOLHDL 2.9 01/04/2020   Lab Results  Component Value Date   ALT 15 01/18/2020   AST 22 01/18/2020   ALKPHOS 74 01/18/2020   BILITOT 0.3 01/18/2020   Course: At goal. Target levels for LDL are: <70. Plan: Dietary changes: Increase soluble fiber. Decrease simple carbohydrates. Exercise changes: An average 40 minutes of moderate to vigorous-intensity aerobic activity 3 or 4 times per week. Lipid-lowering medications: Zetia, Pravastatin.   5. Coronary artery disease involving native coronary artery of native heart without angina pectoris The current medical regimen is effective;  continue present plan and medications.  6. At increased risk of exposure to COVID-19 virus Raymond Jordan was given approximately 15 minutes of COVID prevention counseling today. Raymond Jordan had a number of excellent questions re: COVID and precautions/vaccinations that we discussed today.  7. Class 2 severe obesity with serious comorbidity and body mass index (BMI) of 38.0 to 38.9 in adult, unspecified obesity type [facility])  Raymond Jordan is currently in the action stage of change. As such, his goal is to continue with weight loss efforts. He has agreed to  the Category 3 Plan.   Exercise goals: For substantial health benefits, adults should do at least 150 minutes (2 hours and 30 minutes) a week of moderate-intensity, or 75 minutes (1 hour and 15 minutes) a week of vigorous-intensity aerobic physical activity, or an equivalent combination of moderate- and vigorous-intensity aerobic activity. Aerobic activity should be  performed in episodes of at least 10 minutes, and preferably, it should be spread throughout the week.  Behavioral modification strategies: increasing lean protein intake, decreasing simple carbohydrates and increasing vegetables.  Raymond Jordan has agreed to follow-up with our clinic in 2-3 weeks. He was informed of the importance of frequent follow-up visits to maximize his success with intensive lifestyle modifications for his multiple health conditions.   Objective:   Blood pressure 118/72, pulse (!) 52, temperature 98.1 F (36.7 C), temperature source Oral, height 5\' 11"  (1.803 m), weight 278 lb (126.1 kg), SpO2 100 %. Body mass index is 38.77 kg/m.  General: Cooperative, alert, well developed, in no acute distress. HEENT: Conjunctivae and lids unremarkable. Cardiovascular: Regular rhythm.  Lungs: Normal work of breathing. Neurologic: No focal deficits.   Lab Results  Component Value Date   CREATININE 1.18 01/18/2020   BUN 16 01/18/2020   NA 139 01/18/2020   K 3.7 01/18/2020   CL 103 01/18/2020   CO2 27 01/18/2020   Lab Results  Component Value Date   ALT 15 01/18/2020   AST 22 01/18/2020   ALKPHOS 74 01/18/2020   BILITOT 0.3 01/18/2020   Lab Results  Component Value Date   HGBA1C 6.2 (H) 01/18/2020   Lab Results  Component Value Date   INSULIN 27.9 (H) 01/18/2020   Lab Results  Component Value Date   TSH 2.040 01/18/2020   Lab Results  Component Value Date   CHOL 123 01/04/2020   HDL 43 01/04/2020   LDLCALC 62 01/04/2020   TRIG 92 01/04/2020   CHOLHDL 2.9 01/04/2020   Lab Results  Component Value Date   WBC 5.8 01/18/2020   HGB 12.5 (L) 01/18/2020   HCT 37.4 (L) 01/18/2020   MCV 82 01/18/2020   PLT 178 01/18/2020   Attestation Statements:   Reviewed by clinician on day of visit: allergies, medications, problem list, medical history, surgical history, family history, social history, and previous encounter notes.  I, 01/20/2020, CMA, am acting as  transcriptionist for Insurance claims handler, DO  I have reviewed the above documentation for accuracy and completeness, and I agree with the above. Helane Rima, DO

## 2020-04-09 ENCOUNTER — Other Ambulatory Visit: Payer: Self-pay

## 2020-04-09 ENCOUNTER — Encounter (INDEPENDENT_AMBULATORY_CARE_PROVIDER_SITE_OTHER): Payer: Self-pay | Admitting: Family Medicine

## 2020-04-09 ENCOUNTER — Ambulatory Visit (INDEPENDENT_AMBULATORY_CARE_PROVIDER_SITE_OTHER): Payer: 59 | Admitting: Family Medicine

## 2020-04-09 VITALS — BP 132/75 | HR 55 | Temp 97.9°F | Ht 71.0 in | Wt 278.0 lb

## 2020-04-09 DIAGNOSIS — Z6838 Body mass index (BMI) 38.0-38.9, adult: Secondary | ICD-10-CM

## 2020-04-09 DIAGNOSIS — E65 Localized adiposity: Secondary | ICD-10-CM | POA: Diagnosis not present

## 2020-04-09 DIAGNOSIS — E7849 Other hyperlipidemia: Secondary | ICD-10-CM

## 2020-04-09 DIAGNOSIS — R7303 Prediabetes: Secondary | ICD-10-CM | POA: Diagnosis not present

## 2020-04-10 NOTE — Progress Notes (Signed)
Chief Complaint:   OBESITY Raymond Jordan is here to discuss his progress with his obesity treatment plan along with follow-up of his obesity related diagnoses. Raymond Jordan is on the Category 3 Plan and states he is following his eating plan approximately 75% of the time. Raymond Jordan states he is walking for 30 minutes 3 times per week.  Today's visit was #: 5 Starting weight: 292 lbs Starting date: 01/18/2020 Today's weight: 278 lbs Today's date: 04/09/2020 Total lbs lost to date: 14 lbs Total weight loss percentage to date: -4.79%  Interim History: Raymond Jordan says he just got back from visiting his son and family.  He was able to walk without shortness of breath, which was exciting for him. He will be seeing his PCP for a CPE in November.  Today's bioimpedance results indicate that Raymond Jordan has gained 4 pounds of water weight since his last visit.  Assessment/Plan:   1. Prediabetes Not optimized. Goal is HgbA1c < 5.7 and insulin level closer to 5. He will continue to focus on protein-rich, low simple carbohydrate foods. We reviewed the importance of hydration, regular exercise for stress reduction, and restorative sleep.   Lab Results  Component Value Date   HGBA1C 6.2 (H) 01/18/2020   Lab Results  Component Value Date   INSULIN 27.9 (H) 01/18/2020   2. Other hyperlipidemia Well controlled on current regimen, Pravastatin and Zetia. LDL is at goal on current statin dose, patient has no side effects from medication and LFTS are normal. No changes today.  Lab Results  Component Value Date   ALT 15 01/18/2020   AST 22 01/18/2020   ALKPHOS 74 01/18/2020   BILITOT 0.3 01/18/2020   Lab Results  Component Value Date   CHOL 123 01/04/2020   HDL 43 01/04/2020   LDLCALC 62 01/04/2020   TRIG 92 01/04/2020   CHOLHDL 2.9 01/04/2020   3. Visceral obesity Current visceral fat rating: 23. Visceral adipose tissue is a hormonally active component of total body fat. This body composition phenotype is  associated with medical disorders such as metabolic syndrome, cardiovascular disease and several malignancies including prostate, breast, and colorectal cancers. Goal: Lose 7-10% of starting weight. Visceral fat rating should be < 13.  4. Class 2 severe obesity with serious comorbidity and body mass index (BMI) of 38.0 to 38.9 in adult, unspecified obesity type [facility])  Raymond Jordan is currently in the action stage of change. As such, his goal is to continue with weight loss efforts. He has agreed to the Category 3 Plan.   Exercise goals: For substantial health benefits, adults should do at least 150 minutes (2 hours and 30 minutes) a week of moderate-intensity, or 75 minutes (1 hour and 15 minutes) a week of vigorous-intensity aerobic physical activity, or an equivalent combination of moderate- and vigorous-intensity aerobic activity. Aerobic activity should be performed in episodes of at least 10 minutes, and preferably, it should be spread throughout the week.  Behavioral modification strategies: increasing lean protein intake, decreasing simple carbohydrates, increasing vegetables and decreasing sodium intake.  Raymond Jordan has agreed to follow-up with our clinic in 3 weeks. He was informed of the importance of frequent follow-up visits to maximize his success with intensive lifestyle modifications for his multiple health conditions.   Objective:   Blood pressure 132/75, pulse (!) 55, temperature 97.9 F (36.6 C), temperature source Oral, height 5\' 11"  (1.803 m), weight 278 lb (126.1 kg), SpO2 100 %. Body mass index is 38.77 kg/m.  General: Cooperative, alert, well developed, in  no acute distress. HEENT: Conjunctivae and lids unremarkable. Cardiovascular: Regular rhythm.  Lungs: Normal work of breathing. Neurologic: No focal deficits.   Lab Results  Component Value Date   CREATININE 1.18 01/18/2020   BUN 16 01/18/2020   NA 139 01/18/2020   K 3.7 01/18/2020   CL 103 01/18/2020   CO2 27  01/18/2020   Lab Results  Component Value Date   ALT 15 01/18/2020   AST 22 01/18/2020   ALKPHOS 74 01/18/2020   BILITOT 0.3 01/18/2020   Lab Results  Component Value Date   HGBA1C 6.2 (H) 01/18/2020   Lab Results  Component Value Date   INSULIN 27.9 (H) 01/18/2020   Lab Results  Component Value Date   TSH 2.040 01/18/2020   Lab Results  Component Value Date   CHOL 123 01/04/2020   HDL 43 01/04/2020   LDLCALC 62 01/04/2020   TRIG 92 01/04/2020   CHOLHDL 2.9 01/04/2020   Lab Results  Component Value Date   WBC 5.8 01/18/2020   HGB 12.5 (L) 01/18/2020   HCT 37.4 (L) 01/18/2020   MCV 82 01/18/2020   PLT 178 01/18/2020    Attestation Statements:   Reviewed by clinician on day of visit: allergies, medications, problem list, medical history, surgical history, family history, social history, and previous encounter notes.  I, Insurance claims handler, CMA, am acting as transcriptionist for Helane Rima, DO  I have reviewed the above documentation for accuracy and completeness, and I agree with the above. Helane Rima, DO

## 2020-05-07 ENCOUNTER — Encounter (INDEPENDENT_AMBULATORY_CARE_PROVIDER_SITE_OTHER): Payer: Self-pay | Admitting: Family Medicine

## 2020-05-07 ENCOUNTER — Other Ambulatory Visit: Payer: Self-pay

## 2020-05-07 ENCOUNTER — Ambulatory Visit (INDEPENDENT_AMBULATORY_CARE_PROVIDER_SITE_OTHER): Payer: 59 | Admitting: Family Medicine

## 2020-05-07 VITALS — BP 149/79 | HR 59 | Temp 98.2°F | Ht 71.0 in | Wt 280.0 lb

## 2020-05-07 DIAGNOSIS — E782 Mixed hyperlipidemia: Secondary | ICD-10-CM

## 2020-05-07 DIAGNOSIS — R7303 Prediabetes: Secondary | ICD-10-CM | POA: Diagnosis not present

## 2020-05-07 DIAGNOSIS — Z9989 Dependence on other enabling machines and devices: Secondary | ICD-10-CM

## 2020-05-07 DIAGNOSIS — G4733 Obstructive sleep apnea (adult) (pediatric): Secondary | ICD-10-CM

## 2020-05-07 DIAGNOSIS — Z6839 Body mass index (BMI) 39.0-39.9, adult: Secondary | ICD-10-CM

## 2020-05-07 DIAGNOSIS — I2510 Atherosclerotic heart disease of native coronary artery without angina pectoris: Secondary | ICD-10-CM | POA: Diagnosis not present

## 2020-05-08 NOTE — Progress Notes (Signed)
Chief Complaint:   OBESITY Raymond Jordan is here to discuss his progress with his obesity treatment plan along with follow-up of his obesity related diagnoses.   Today's visit was #: 6 Starting weight: 292 lbs Starting date: 01/18/2020 Today's weight: 280 lbs Today's date: 05/07/2020 Total lbs lost to date: 12 lbs Body mass index is 39.05 kg/m.  Total weight loss percentage to date: -4.11%  Interim History: Raymond Jordan states that if he keeps gaining weight, he will be open to trying a GLP-1 RA. Nutrition Plan: the Category 3 Plan for 80% of the time.  Activity: Walking for 30 minutes 1 time per week.  Assessment/Plan:   1. Prediabetes Not at goal. Goal is HgbA1c < 5.7 and insulin level closer to 5. He will continue to focus on protein-rich, low simple carbohydrate foods. We reviewed the importance of hydration, regular exercise for stress reduction, and restorative sleep.   Lab Results  Component Value Date   HGBA1C 6.2 (H) 01/18/2020   Lab Results  Component Value Date   INSULIN 27.9 (H) 01/18/2020   2. Mixed hyperlipidemia Plan: Dietary changes: Increase soluble fiber. Decrease simple carbohydrates. Exercise changes: An average 40 minutes of moderate to vigorous-intensity aerobic activity 3 or 4 times per week. Lipid-lowering medications: Pravachol, Zetia.   Lab Results  Component Value Date   CHOL 123 01/04/2020   HDL 43 01/04/2020   LDLCALC 62 01/04/2020   TRIG 92 01/04/2020   CHOLHDL 2.9 01/04/2020   Lab Results  Component Value Date   ALT 15 01/18/2020   AST 22 01/18/2020   ALKPHOS 74 01/18/2020   BILITOT 0.3 01/18/2020   3. Coronary artery disease involving native coronary artery of native heart The current medical regimen is effective;  continue present plan and medications. We will continue to monitor symptoms as they relate to his weight loss journey.  4. OSA on CPAP Compliant. OSA is a cause of systemic hypertension and is associated with an increased  incidence of stroke, heart failure, atrial fibrillation, and coronary heart disease. Severe OSA increases all-cause mortality and cardiovascular mortality.   5. Class 2 severe obesity with serious comorbidity and body mass index (BMI) of 39.0 to 39.9 in adult, unspecified obesity type [facility])  Course: Raymond Jordan is currently in the action stage of change. As such, his goal is to continue with weight loss efforts.   Nutrition goals: He has agreed to the Category 3 Plan.   Exercise goals: For substantial health benefits, adults should do at least 150 minutes (2 hours and 30 minutes) a week of moderate-intensity, or 75 minutes (1 hour and 15 minutes) a week of vigorous-intensity aerobic physical activity, or an equivalent combination of moderate- and vigorous-intensity aerobic activity. Aerobic activity should be performed in episodes of at least 10 minutes, and preferably, it should be spread throughout the week.  Behavioral modification strategies: increasing lean protein intake, decreasing simple carbohydrates, increasing vegetables, increasing water intake, decreasing liquid calories, decreasing sodium intake and increasing high fiber foods.  Raymond Jordan has agreed to follow-up with our clinic in 3 weeks. He was informed of the importance of frequent follow-up visits to maximize his success with intensive lifestyle modifications for his multiple health conditions.   Objective:   Blood pressure (!) 149/79, pulse (!) 59, temperature 98.2 F (36.8 C), temperature source Oral, height 5\' 11"  (1.803 m), weight 280 lb (127 kg), SpO2 97 %. Body mass index is 39.05 kg/m.  General: Cooperative, alert, well developed, in no acute distress. HEENT:  Conjunctivae and lids unremarkable. Cardiovascular: Regular rhythm.  Lungs: Normal work of breathing. Neurologic: No focal deficits.   Lab Results  Component Value Date   CREATININE 1.18 01/18/2020   BUN 16 01/18/2020   NA 139 01/18/2020   K 3.7 01/18/2020   CL  103 01/18/2020   CO2 27 01/18/2020   Lab Results  Component Value Date   ALT 15 01/18/2020   AST 22 01/18/2020   ALKPHOS 74 01/18/2020   BILITOT 0.3 01/18/2020   Lab Results  Component Value Date   HGBA1C 6.2 (H) 01/18/2020   Lab Results  Component Value Date   INSULIN 27.9 (H) 01/18/2020   Lab Results  Component Value Date   TSH 2.040 01/18/2020   Lab Results  Component Value Date   CHOL 123 01/04/2020   HDL 43 01/04/2020   LDLCALC 62 01/04/2020   TRIG 92 01/04/2020   CHOLHDL 2.9 01/04/2020   Lab Results  Component Value Date   WBC 5.8 01/18/2020   HGB 12.5 (L) 01/18/2020   HCT 37.4 (L) 01/18/2020   MCV 82 01/18/2020   PLT 178 01/18/2020   Attestation Statements:   Reviewed by clinician on day of visit: allergies, medications, problem list, medical history, surgical history, family history, social history, and previous encounter notes.  I, Insurance claims handler, CMA, am acting as transcriptionist for Helane Rima, DO  I have reviewed the above documentation for accuracy and completeness, and I agree with the above. Helane Rima, DO

## 2020-05-25 ENCOUNTER — Other Ambulatory Visit (INDEPENDENT_AMBULATORY_CARE_PROVIDER_SITE_OTHER): Payer: Self-pay | Admitting: Family Medicine

## 2020-05-25 DIAGNOSIS — E559 Vitamin D deficiency, unspecified: Secondary | ICD-10-CM

## 2020-05-27 NOTE — Telephone Encounter (Signed)
Last OV with Dr Wallace 

## 2020-06-03 ENCOUNTER — Other Ambulatory Visit: Payer: Self-pay

## 2020-06-03 ENCOUNTER — Encounter (INDEPENDENT_AMBULATORY_CARE_PROVIDER_SITE_OTHER): Payer: Self-pay | Admitting: Bariatrics

## 2020-06-03 ENCOUNTER — Ambulatory Visit (INDEPENDENT_AMBULATORY_CARE_PROVIDER_SITE_OTHER): Payer: 59 | Admitting: Bariatrics

## 2020-06-03 VITALS — BP 136/86 | HR 62 | Temp 97.9°F | Ht 71.0 in | Wt 278.0 lb

## 2020-06-03 DIAGNOSIS — I10 Essential (primary) hypertension: Secondary | ICD-10-CM | POA: Diagnosis not present

## 2020-06-03 DIAGNOSIS — E782 Mixed hyperlipidemia: Secondary | ICD-10-CM | POA: Diagnosis not present

## 2020-06-03 DIAGNOSIS — Z6838 Body mass index (BMI) 38.0-38.9, adult: Secondary | ICD-10-CM | POA: Diagnosis not present

## 2020-06-03 LAB — PSA: PSA: 3.4

## 2020-06-03 LAB — BASIC METABOLIC PANEL
BUN: 19 (ref 4–21)
Creatinine: 1.2 (ref 0.6–1.3)
Glucose: 134

## 2020-06-03 LAB — HEMOGLOBIN A1C: Hemoglobin A1C: 6.3

## 2020-06-03 NOTE — Progress Notes (Signed)
Chief Complaint:   OBESITY Raymond Batzel. is here to discuss his progress with his obesity treatment plan along with follow-up of his obesity related diagnoses. Raymond Jordan is on the Category 3 Plan and states he is following his eating plan approximately 75% of the time. Raymond Jordan states he is exercising 0 minutes 0 times per week.  Today's visit was #: 7 Starting weight: 292 lbs Starting date: 01/18/2020 Today's weight: 278 lbs Today's date: 06/03/2020 Total lbs lost to date: 14 Total lbs lost since last in-office visit: 2  Interim History: Raymond Jordan is down an additional 2 lbs since his last visit.  Subjective:   Essential hypertension. Raymond Jordan is taking [place], Cozaar, and Lopressor.  BP Readings from Last 3 Encounters:  06/03/20 136/86  05/07/20 (!) 149/79  04/09/20 132/75   Lab Results  Component Value Date   CREATININE 1.18 01/18/2020   CREATININE 1.15 01/04/2020   CREATININE 1.00 10/09/2019   Mixed hyperlipidemia. Raymond Jordan is taking Pravachol and Zetia.   Lab Results  Component Value Date   CHOL 123 01/04/2020   HDL 43 01/04/2020   LDLCALC 62 01/04/2020   TRIG 92 01/04/2020   CHOLHDL 2.9 01/04/2020   Lab Results  Component Value Date   ALT 15 01/18/2020   AST 22 01/18/2020   ALKPHOS 74 01/18/2020   BILITOT 0.3 01/18/2020   The ASCVD Risk score Denman George DC Jr., et al., 2013) failed to calculate for the following reasons:   The patient has a prior MI or stroke diagnosis  Assessment/Plan:   Essential hypertension. Raymond Jordan is working on healthy weight loss and exercise to improve blood pressure control. We will watch for signs of hypotension as he continues his lifestyle modifications. He will continue his medications as directed.   Mixed hyperlipidemia. Cardiovascular risk and specific lipid/LDL goals reviewed.  We discussed several lifestyle modifications today and Raymond Jordan will continue to work on diet, exercise and weight loss efforts. Orders and follow up as  documented in patient record. He will continue his medications as directed.   Counseling Intensive lifestyle modifications are the first line treatment for this issue. . Dietary changes: Increase soluble fiber. Decrease simple carbohydrates. . Exercise changes: Moderate to vigorous-intensity aerobic activity 150 minutes per week if tolerated. . Lipid-lowering medications: see documented in medical record.  Class 2 severe obesity with serious comorbidity and body mass index (BMI) of 38.0 to 38.9 in adult, unspecified obesity type (HCC).  Raymond Jordan is currently in the action stage of change. As such, his goal is to continue with weight loss efforts. He has agreed to the Category 3 Plan.   He will work on meal planning and continue adhering closely to the plan.  Exercise goals: All adults should avoid inactivity. Some physical activity is better than none, and adults who participate in any amount of physical activity gain some health benefits.  Behavioral modification strategies: increasing lean protein intake, decreasing simple carbohydrates, increasing vegetables, increasing water intake, decreasing eating out, no skipping meals, meal planning and cooking strategies, keeping healthy foods in the home and planning for success.  Raymond Jordan has agreed to follow-up with our clinic in 2-3 weeks. He was informed of the importance of frequent follow-up visits to maximize his success with intensive lifestyle modifications for his multiple health conditions.   Objective:   Blood pressure 136/86, pulse 62, temperature 97.9 F (36.6 C), height 5\' 11"  (1.803 m), weight 278 lb (126.1 kg), SpO2 96 %. Body mass index is 38.77 kg/m.  General: Cooperative, alert, well developed, in no acute distress. HEENT: Conjunctivae and lids unremarkable. Cardiovascular: Regular rhythm.  Lungs: Normal work of breathing. Neurologic: No focal deficits.   Lab Results  Component Value Date   CREATININE 1.18 01/18/2020    BUN 16 01/18/2020   NA 139 01/18/2020   K 3.7 01/18/2020   CL 103 01/18/2020   CO2 27 01/18/2020   Lab Results  Component Value Date   ALT 15 01/18/2020   AST 22 01/18/2020   ALKPHOS 74 01/18/2020   BILITOT 0.3 01/18/2020   Lab Results  Component Value Date   HGBA1C 6.2 (H) 01/18/2020   Lab Results  Component Value Date   INSULIN 27.9 (H) 01/18/2020   Lab Results  Component Value Date   TSH 2.040 01/18/2020   Lab Results  Component Value Date   CHOL 123 01/04/2020   HDL 43 01/04/2020   LDLCALC 62 01/04/2020   TRIG 92 01/04/2020   CHOLHDL 2.9 01/04/2020   Lab Results  Component Value Date   WBC 5.8 01/18/2020   HGB 12.5 (L) 01/18/2020   HCT 37.4 (L) 01/18/2020   MCV 82 01/18/2020   PLT 178 01/18/2020   No results found for: IRON, TIBC, FERRITIN  Attestation Statements:   Reviewed by clinician on day of visit: allergies, medications, problem list, medical history, surgical history, family history, social history, and previous encounter notes.  Time spent on visit including pre-visit chart review and post-visit charting and care was 20 minutes.   Fernanda Drum, am acting as Energy manager for [employer], DO   I have reviewed the above documentation for accuracy and completeness, and I agree with the above. Corinna Capra, DO

## 2020-06-04 ENCOUNTER — Encounter (INDEPENDENT_AMBULATORY_CARE_PROVIDER_SITE_OTHER): Payer: Self-pay | Admitting: Bariatrics

## 2020-06-24 ENCOUNTER — Other Ambulatory Visit (INDEPENDENT_AMBULATORY_CARE_PROVIDER_SITE_OTHER): Payer: Self-pay | Admitting: Family Medicine

## 2020-06-24 ENCOUNTER — Other Ambulatory Visit: Payer: Self-pay

## 2020-06-24 ENCOUNTER — Ambulatory Visit (INDEPENDENT_AMBULATORY_CARE_PROVIDER_SITE_OTHER): Payer: 59 | Admitting: Family Medicine

## 2020-06-24 ENCOUNTER — Encounter (INDEPENDENT_AMBULATORY_CARE_PROVIDER_SITE_OTHER): Payer: Self-pay | Admitting: Family Medicine

## 2020-06-24 VITALS — BP 135/71 | HR 59 | Temp 98.1°F | Ht 71.0 in | Wt 279.0 lb

## 2020-06-24 DIAGNOSIS — R7303 Prediabetes: Secondary | ICD-10-CM | POA: Diagnosis not present

## 2020-06-24 DIAGNOSIS — E65 Localized adiposity: Secondary | ICD-10-CM

## 2020-06-24 DIAGNOSIS — Z6839 Body mass index (BMI) 39.0-39.9, adult: Secondary | ICD-10-CM

## 2020-06-24 DIAGNOSIS — E559 Vitamin D deficiency, unspecified: Secondary | ICD-10-CM | POA: Diagnosis not present

## 2020-06-24 DIAGNOSIS — I10 Essential (primary) hypertension: Secondary | ICD-10-CM | POA: Diagnosis not present

## 2020-06-24 DIAGNOSIS — Z9189 Other specified personal risk factors, not elsewhere classified: Secondary | ICD-10-CM

## 2020-06-24 MED ORDER — VITAMIN D (ERGOCALCIFEROL) 1.25 MG (50000 UNIT) PO CAPS: 50000.0000 [IU] | ORAL_CAPSULE | ORAL | 0 refills | Status: DC

## 2020-06-25 NOTE — Progress Notes (Signed)
Chief Complaint:   OBESITY Raymond Jordan is here to discuss his progress with his obesity treatment plan along with follow-up of his obesity related diagnoses.   Today's visit was #: 8 Starting weight: 292 lbs Starting date: 01/18/2020 Today's weight: 279 lbs Today's date: 06/24/2020 Total lbs lost to date: 13 lbs Body mass index is 38.91 kg/m.  Total weight loss percentage to date: -4.45%  Interim History: [name] says he is thinking about trying water aerobics.  He had a recent CPE with his PCP. Will abstract. 06/03/20: A1c 6.3, creatinine 1.16, PSA 3.4. He says that he has a recheck in 1 month. He believes that labs will be retested.  Nutrition Plan: the Category 3 Plan for 80% of the time.  Activity: Increased walking.  Assessment/Plan:   1. Vitamin D deficiency Not at goal. Current vitamin D is 22.1, tested on 01/18/2020. Optimal goal > 50 ng/dL.   Plan:  [x]   Continue Vitamin D @50 ,000 IU every week. []   Continue home supplement daily. [x]   Follow-up for routine testing of Vitamin D at least 2-3 times per year to avoid over-replacement.  - Refill Vitamin D, Ergocalciferol, (DRISDOL) 1.25 MG (50000 UNIT) CAPS capsule; Take 1 capsule (50,000 Units total) by mouth every 7 (seven) days.  Dispense: 12 capsule; Refill: 0  2. Prediabetes Not at goal. Goal is HgbA1c < 5.7.  Medication: None.  He will continue to focus on protein-rich, low simple carbohydrate foods. We reviewed the importance of hydration, regular exercise for stress reduction, and restorative sleep.   We discussed Saxenda and metformin. He declines medications at this time.   Lab Results  Component Value Date   HGBA1C 6.3 06/03/2020   Lab Results  Component Value Date   INSULIN 27.9 (H) 01/18/2020   3. Essential hypertension At goal. Medications: chlorthalidone, Cozaar, metoprolol. Plan: Avoid buying foods that are: processed, frozen, or prepackaged to avoid excess salt. We will continue to monitor symptoms  as they relate to his weight loss journey.  BP Readings from Last 3 Encounters:  06/24/20 135/71  06/03/20 136/86  05/07/20 (!) 149/79   Lab Results  Component Value Date   CREATININE 1.2 06/03/2020   4. Visceral obesity Current visceral fat rating: 23. Visceral fat rating should be < 13. Visceral adipose tissue is a hormonally active component of total body fat. This body composition phenotype is associated with medical disorders such as metabolic syndrome, cardiovascular disease and several malignancies including prostate, breast, and colorectal cancers. Starting goal: Lose 7-10% of starting weight.   5. At risk for heart disease Raymond Jordan was given approximately 10 minutes of coronary artery disease prevention counseling today. He is 59 y.o. male and has risk factors for heart disease including obesity and metabolic syndrome. We discussed intensive lifestyle modifications today with an emphasis on specific weight loss instructions and strategies. Repetitive spaced learning was employed today to elicit superior memory formation and behavioral change.  6. Class 2 severe obesity with serious comorbidity and body mass index (BMI) of 39.0 to 39.9 in adult, unspecified obesity type [facility])  Course: Raymond Jordan is currently in the action stage of change. As such, his goal is to continue with weight loss efforts.   Nutrition goals: He has agreed to the Category 3 Plan.   Exercise goals: For substantial health benefits, adults should do at least 150 minutes (2 hours and 30 minutes) a week of moderate-intensity, or 75 minutes (1 hour and 15 minutes) a week of vigorous-intensity aerobic physical activity,  or an equivalent combination of moderate- and vigorous-intensity aerobic activity. Aerobic activity should be performed in episodes of at least 10 minutes, and preferably, it should be spread throughout the week.  Behavioral modification strategies: increasing lean protein intake, decreasing simple  carbohydrates, increasing vegetables, increasing water intake, dealing with family or coworker sabotage, travel eating strategies and holiday eating strategies .  Smokey has agreed to follow-up with our clinic in 4 weeks. He was informed of the importance of frequent follow-up visits to maximize his success with intensive lifestyle modifications for his multiple health conditions.   Objective:   Blood pressure 135/71, pulse (!) 59, temperature 98.1 F (36.7 C), temperature source Oral, height 5\' 11"  (1.803 m), weight 279 lb (126.6 kg), SpO2 97 %. Body mass index is 38.91 kg/m.  General: Cooperative, alert, well developed, in no acute distress. HEENT: Conjunctivae and lids unremarkable. Cardiovascular: Regular rhythm.  Lungs: Normal work of breathing. Neurologic: No focal deficits.   Lab Results  Component Value Date   CREATININE 1.2 06/03/2020   BUN 19 06/03/2020   NA 139 01/18/2020   K 3.7 01/18/2020   CL 103 01/18/2020   CO2 27 01/18/2020   Lab Results  Component Value Date   ALT 15 01/18/2020   AST 22 01/18/2020   ALKPHOS 74 01/18/2020   BILITOT 0.3 01/18/2020   Lab Results  Component Value Date   HGBA1C 6.3 06/03/2020   HGBA1C 6.2 (H) 01/18/2020   Lab Results  Component Value Date   INSULIN 27.9 (H) 01/18/2020   Lab Results  Component Value Date   TSH 2.040 01/18/2020   Lab Results  Component Value Date   CHOL 123 01/04/2020   HDL 43 01/04/2020   LDLCALC 62 01/04/2020   TRIG 92 01/04/2020   CHOLHDL 2.9 01/04/2020   Lab Results  Component Value Date   WBC 5.8 01/18/2020   HGB 12.5 (L) 01/18/2020   HCT 37.4 (L) 01/18/2020   MCV 82 01/18/2020   PLT 178 01/18/2020   Attestation Statements:   Reviewed by clinician on day of visit: allergies, medications, problem list, medical history, surgical history, family history, social history, and previous encounter notes.  I, 01/20/2020, CMA, am acting as transcriptionist for Insurance claims handler, DO  I have  reviewed the above documentation for accuracy and completeness, and I agree with the above. Helane Rima, DO

## 2020-07-11 ENCOUNTER — Other Ambulatory Visit: Payer: Self-pay | Admitting: Orthopaedic Surgery

## 2020-07-11 DIAGNOSIS — M545 Low back pain, unspecified: Secondary | ICD-10-CM

## 2020-07-11 DIAGNOSIS — M25512 Pain in left shoulder: Secondary | ICD-10-CM

## 2020-07-21 ENCOUNTER — Other Ambulatory Visit (INDEPENDENT_AMBULATORY_CARE_PROVIDER_SITE_OTHER): Payer: Self-pay | Admitting: Family Medicine

## 2020-07-21 DIAGNOSIS — E559 Vitamin D deficiency, unspecified: Secondary | ICD-10-CM

## 2020-07-22 NOTE — Telephone Encounter (Signed)
Dr.Wallace °

## 2020-07-29 ENCOUNTER — Other Ambulatory Visit (INDEPENDENT_AMBULATORY_CARE_PROVIDER_SITE_OTHER): Payer: Self-pay | Admitting: Family Medicine

## 2020-07-29 DIAGNOSIS — E559 Vitamin D deficiency, unspecified: Secondary | ICD-10-CM

## 2020-07-30 NOTE — Telephone Encounter (Signed)
Last OV with Dr Wallace 

## 2020-07-31 ENCOUNTER — Ambulatory Visit (INDEPENDENT_AMBULATORY_CARE_PROVIDER_SITE_OTHER): Payer: 59 | Admitting: Family Medicine

## 2020-07-31 ENCOUNTER — Ambulatory Visit
Admission: RE | Admit: 2020-07-31 | Discharge: 2020-07-31 | Disposition: A | Discharge status: Home / Self Care | Payer: 59 | Source: Ambulatory Visit | Attending: Orthopaedic Surgery | Admitting: Orthopaedic Surgery

## 2020-07-31 ENCOUNTER — Encounter (INDEPENDENT_AMBULATORY_CARE_PROVIDER_SITE_OTHER): Payer: Self-pay | Admitting: Family Medicine

## 2020-07-31 ENCOUNTER — Other Ambulatory Visit: Payer: Self-pay

## 2020-07-31 VITALS — BP 138/84 | HR 65 | Temp 97.8°F | Ht 71.0 in | Wt 282.0 lb

## 2020-07-31 DIAGNOSIS — Z9189 Other specified personal risk factors, not elsewhere classified: Secondary | ICD-10-CM

## 2020-07-31 DIAGNOSIS — I10 Essential (primary) hypertension: Secondary | ICD-10-CM

## 2020-07-31 DIAGNOSIS — E65 Localized adiposity: Secondary | ICD-10-CM

## 2020-07-31 DIAGNOSIS — E559 Vitamin D deficiency, unspecified: Secondary | ICD-10-CM

## 2020-07-31 DIAGNOSIS — E7849 Other hyperlipidemia: Secondary | ICD-10-CM

## 2020-07-31 DIAGNOSIS — M25512 Pain in left shoulder: Secondary | ICD-10-CM | POA: Diagnosis not present

## 2020-07-31 DIAGNOSIS — Z6839 Body mass index (BMI) 39.0-39.9, adult: Secondary | ICD-10-CM

## 2020-07-31 DIAGNOSIS — M545 Low back pain, unspecified: Secondary | ICD-10-CM

## 2020-08-01 MED ORDER — VITAMIN D (ERGOCALCIFEROL) 1.25 MG (50000 UNIT) PO CAPS: 50000.0000 [IU] | ORAL_CAPSULE | ORAL | 0 refills | Status: DC

## 2020-08-02 ENCOUNTER — Telehealth: Payer: Self-pay | Admitting: *Deleted

## 2020-08-02 NOTE — Telephone Encounter (Signed)
   Primary Cardiologist: Armanda Magic, MD  Chart reviewed as part of pre-operative protocol coverage. Patient was contacted 08/02/2020 in reference to pre-operative risk assessment for pending surgery as outlined below.  Raymond Jordan. was last seen on 12/21/2019 by Dr. Mayford Knife.  Since that day, [name]. has done been doing well without any exertional chest pain or shortness of breath.  He is clearly able to accomplish more than 4 METS of activity without any issue.  Therefore, based on ACC/AHA guidelines, the patient would be at acceptable risk for the planned procedure without further cardiovascular testing.   The patient was advised that if he develops new symptoms prior to surgery to contact our office to arrange for a follow-up visit, and he verbalized understanding.  Dr. Mayford Knife, orthopedic service requested to hold aspirin and Plavix.  Please review and comment on whether the patient may hold aspirin and Plavix for 5-7 days prior to shoulder repair.   Please reply to P CV DIV PREOP  Azalee Course, PA 08/02/2020, 1:28 PM

## 2020-08-02 NOTE — Telephone Encounter (Signed)
   [facility] Pre-operative Risk Assessment    HEARTCARE STAFF: - Please ensure there is not already an duplicate clearance open for this procedure. - Under Visit Info/Reason for Call, type in Other and utilize the format Clearance MM/DD/YY or Clearance TBD. Do not use dashes or single digits. - If request is for dental extraction, please clarify the # of teeth to be extracted.  Request for surgical clearance:  1. What type of surgery is being performed? LEFT SHOULDER SCOPE, ROTATOR CUFF REPAIR   2. When is this surgery scheduled? TBD   3. What type of clearance is required (medical clearance vs. Pharmacy clearance to hold med vs. Both)? MEDICAL  4. Are there any medications that need to be held prior to surgery and how long? PLAVIX AND ASA   5. Practice name and name of physician performing surgery? [facility]; DR. DAX VARKEY   6. What is the office phone number? [phone]   7.   What is the office fax number? [place].   Anesthesia type (None, local, MAC, general) ? GENERAL   Julaine Hua 08/02/2020, 11:31 AM  _________________________________________________________________   (provider comments below)

## 2020-08-03 NOTE — Telephone Encounter (Signed)
Ok to hold Plavix and ASA for surgery

## 2020-08-05 NOTE — Telephone Encounter (Signed)
   Primary Cardiologist: Armanda Magic, MD  Chart reviewed as part of pre-operative protocol coverage. Given past medical history and time since last visit, based on ACC/AHA guidelines, Orey Moure. would be at acceptable risk for the planned procedure without further cardiovascular testing.   OK to hold aspirin and Plavix 5-7 days pre op, resume as soon as safe post op.  The patient was advised that if he develops new symptoms prior to surgery to contact our office to arrange for a follow-up visit, and he verbalized understanding.  I will route this recommendation to the requesting party via Epic fax function and remove from pre-op pool.  Please call with questions.  Corine Shelter, PA-C 08/05/2020, 9:08 AM

## 2020-08-05 NOTE — Progress Notes (Signed)
Chief Complaint:   OBESITY Raymond Jordan is here to discuss his progress with his obesity treatment plan along with follow-up of his obesity related diagnoses.   Today's visit was #: 9 Starting weight: 292 lbs Starting date: 01/18/2020 Today's weight: 282 lbs Today's date: 07/31/2020 Total lbs lost to date: 10 lbs Body mass index is 39.33 kg/m.  Total weight loss percentage to date: -3.42%  Interim History: Raymond Jordan has a left shoulder MRI today.  He signed up for water aerobics.  Starts in March. Nutrition Plan: the Category 3 Plan for 60% of the time.  Activity: None at this time.  Assessment/Plan:   1. Left shoulder pain Raymond Jordan will be having an MRI on his shoulder today. We will continue to monitor symptoms as they relate to his weight loss journey.  2. Vitamin D deficiency Not at goal. Current vitamin D is 22.1, tested on 01/18/2020. Optimal goal > 50 ng/dL.   Plan:  [x]   Continue Vitamin D @50 ,000 IU every week. []   Continue home supplement daily. [x]   Follow-up for routine testing of Vitamin D at least 2-3 times per year to avoid over-replacement.  - Refill Vitamin D, Ergocalciferol, (DRISDOL) 1.25 MG (50000 UNIT) CAPS capsule; Take 1 capsule (50,000 Units total) by mouth every 7 (seven) days.  Dispense: 12 capsule; Refill: 0  3. Essential hypertension At goal. Medications: chlorthalidone 25 mg daily, Cozaar 25 mg daily, and metoprolol 50 mg twice daily.   Plan: Avoid buying foods that are: processed, frozen, or prepackaged to avoid excess salt. We will continue to monitor symptoms as they relate to his weight loss journey.  BP Readings from Last 3 Encounters:  07/31/20 138/84  06/24/20 135/71  06/03/20 136/86   Lab Results  Component Value Date   CREATININE 1.2 06/03/2020   4. Other hyperlipidemia Lipid-lowering medications: Zetia 10 mg daily and Pravachol 80 mg daily.   Plan: Dietary changes: Increase soluble fiber. Decrease simple carbohydrates. Exercise  changes: An average 40 minutes of moderate to vigorous-intensity aerobic activity 3 or 4 times per week.   Lab Results  Component Value Date   CHOL 123 01/04/2020   HDL 43 01/04/2020   LDLCALC 62 01/04/2020   TRIG 92 01/04/2020   CHOLHDL 2.9 01/04/2020   Lab Results  Component Value Date   ALT 15 01/18/2020   AST 22 01/18/2020   ALKPHOS 74 01/18/2020   BILITOT 0.3 01/18/2020   5. Visceral obesity Current visceral fat rating: 23. Visceral fat rating should be < 13. Visceral adipose tissue is a hormonally active component of total body fat. This body composition phenotype is associated with medical disorders such as metabolic syndrome, cardiovascular disease and several malignancies including prostate, breast, and colorectal cancers. Starting goal: Lose 7-10% of starting weight.   6. At risk for heart disease Due to Raymond Jordan's current state of health and medical condition(s), he is at a higher risk for heart disease.   This puts the patient at much greater risk to subsequently develop cardiopulmonary conditions that can significantly affect patient's quality of life in a negative manner as well.    At least 9 minutes was spent on counseling Raymond Jordan about these concerns today. Initial goal is to lose at least 5-10% of starting weight to help reduce these risk factors.  We will continue to reassess these conditions on a fairly regular basis in an attempt to decrease patient's overall morbidity and mortality.  Evidence-based interventions for health behavior change were utilized today including the  discussion of self monitoring techniques, problem-solving barriers and SMART goal setting techniques.  Specifically regarding patient's less desirable eating habits and patterns, we employed the technique of small changes when Raymond Jordan has not been able to fully commit to his prudent nutritional plan.  7. Class 2 severe obesity with serious comorbidity and body mass index (BMI) of 39.0 to 39.9 in adult,  unspecified obesity type Raymond Jordan)  Course: Raymond Jordan is currently in the action stage of change. As such, his goal is to continue with weight loss efforts.   Nutrition goals: He has agreed to the Category 3 Plan.   Exercise goals: For substantial health benefits, adults should do at least 150 minutes (2 hours and 30 minutes) a week of moderate-intensity, or 75 minutes (1 hour and 15 minutes) a week of vigorous-intensity aerobic physical activity, or an equivalent combination of moderate- and vigorous-intensity aerobic activity. Aerobic activity should be performed in episodes of at least 10 minutes, and preferably, it should be spread throughout the week.  Behavioral modification strategies: increasing lean protein intake, decreasing simple carbohydrates, increasing vegetables, increasing water intake, decreasing liquid calories, decreasing alcohol intake, decreasing sodium intake and increasing high fiber foods.  Raymond Jordan has agreed to follow-up with our clinic in 3-4 weeks. He was informed of the importance of frequent follow-up visits to maximize his success with intensive lifestyle modifications for his multiple health conditions.   Objective:   Blood pressure 138/84, pulse 65, temperature 97.8 F (36.6 C), temperature source Oral, height 5\' 11"  (1.803 m), weight 282 lb (127.9 kg), SpO2 97 %. Body mass index is 39.33 kg/m.  General: Cooperative, alert, well developed, in no acute distress. HEENT: Conjunctivae and lids unremarkable. Cardiovascular: Regular rhythm.  Lungs: Normal work of breathing. Neurologic: No focal deficits.   Lab Results  Component Value Date   CREATININE 1.2 06/03/2020   BUN 19 06/03/2020   NA 139 01/18/2020   K 3.7 01/18/2020   CL 103 01/18/2020   CO2 27 01/18/2020   Lab Results  Component Value Date   ALT 15 01/18/2020   AST 22 01/18/2020   ALKPHOS 74 01/18/2020   BILITOT 0.3 01/18/2020   Lab Results  Component Value Date   HGBA1C 6.3 06/03/2020   HGBA1C  6.2 (H) 01/18/2020   Lab Results  Component Value Date   INSULIN 27.9 (H) 01/18/2020   Lab Results  Component Value Date   TSH 2.040 01/18/2020   Lab Results  Component Value Date   CHOL 123 01/04/2020   HDL 43 01/04/2020   LDLCALC 62 01/04/2020   TRIG 92 01/04/2020   CHOLHDL 2.9 01/04/2020   Lab Results  Component Value Date   WBC 5.8 01/18/2020   HGB 12.5 (L) 01/18/2020   HCT 37.4 (L) 01/18/2020   MCV 82 01/18/2020   PLT 178 01/18/2020   Attestation Statements:   Reviewed by clinician on day of visit: allergies, medications, problem list, medical history, surgical history, family history, social history, and previous encounter notes.  I, 01/20/2020, CMA, am acting as transcriptionist for Insurance claims handler, DO  I have reviewed the above documentation for accuracy and completeness, and I agree with the above. Helane Rima, DO

## 2020-08-06 ENCOUNTER — Telehealth (INDEPENDENT_AMBULATORY_CARE_PROVIDER_SITE_OTHER): Payer: Self-pay | Admitting: Family Medicine

## 2020-08-29 ENCOUNTER — Ambulatory Visit (INDEPENDENT_AMBULATORY_CARE_PROVIDER_SITE_OTHER): Payer: 59 | Admitting: Family Medicine

## 2020-09-06 ENCOUNTER — Other Ambulatory Visit (INDEPENDENT_AMBULATORY_CARE_PROVIDER_SITE_OTHER): Payer: Self-pay | Admitting: Family Medicine

## 2020-09-06 DIAGNOSIS — E559 Vitamin D deficiency, unspecified: Secondary | ICD-10-CM

## 2020-09-09 ENCOUNTER — Telehealth (INDEPENDENT_AMBULATORY_CARE_PROVIDER_SITE_OTHER): Payer: Self-pay

## 2020-09-09 ENCOUNTER — Other Ambulatory Visit (INDEPENDENT_AMBULATORY_CARE_PROVIDER_SITE_OTHER): Payer: Self-pay | Admitting: Family Medicine

## 2020-09-09 DIAGNOSIS — E559 Vitamin D deficiency, unspecified: Secondary | ICD-10-CM

## 2020-09-09 NOTE — Telephone Encounter (Signed)
Last OV with Dr Wallace 

## 2020-09-09 NOTE — Telephone Encounter (Signed)
Jessica at [facility] is asking if patient's Vit D was approved or denied.   [facility] ph [id number] Ref #[id number]  Thank you

## 2020-09-10 ENCOUNTER — Other Ambulatory Visit (INDEPENDENT_AMBULATORY_CARE_PROVIDER_SITE_OTHER): Payer: Self-pay | Admitting: Family Medicine

## 2020-09-10 DIAGNOSIS — E559 Vitamin D deficiency, unspecified: Secondary | ICD-10-CM

## 2020-09-10 NOTE — Telephone Encounter (Signed)
RX was denied, will refill at next OV

## 2020-09-11 NOTE — Telephone Encounter (Signed)
Dr.Wallace °

## 2020-09-24 ENCOUNTER — Encounter (INDEPENDENT_AMBULATORY_CARE_PROVIDER_SITE_OTHER): Payer: Self-pay | Admitting: Family Medicine

## 2020-09-24 ENCOUNTER — Ambulatory Visit (INDEPENDENT_AMBULATORY_CARE_PROVIDER_SITE_OTHER): Payer: 59 | Admitting: Family Medicine

## 2020-09-24 ENCOUNTER — Other Ambulatory Visit (INDEPENDENT_AMBULATORY_CARE_PROVIDER_SITE_OTHER): Payer: Self-pay | Admitting: Family Medicine

## 2020-09-24 ENCOUNTER — Other Ambulatory Visit: Payer: Self-pay

## 2020-09-24 VITALS — BP 145/79 | HR 76 | Temp 98.1°F | Ht 71.0 in | Wt 282.0 lb

## 2020-09-24 DIAGNOSIS — J301 Allergic rhinitis due to pollen: Secondary | ICD-10-CM | POA: Insufficient documentation

## 2020-09-24 DIAGNOSIS — M4726 Other spondylosis with radiculopathy, lumbar region: Secondary | ICD-10-CM

## 2020-09-24 DIAGNOSIS — Z9189 Other specified personal risk factors, not elsewhere classified: Secondary | ICD-10-CM

## 2020-09-24 DIAGNOSIS — G8929 Other chronic pain: Secondary | ICD-10-CM | POA: Diagnosis not present

## 2020-09-24 DIAGNOSIS — M179 Osteoarthritis of knee, unspecified: Secondary | ICD-10-CM | POA: Insufficient documentation

## 2020-09-24 DIAGNOSIS — M1710 Unilateral primary osteoarthritis, unspecified knee: Secondary | ICD-10-CM | POA: Insufficient documentation

## 2020-09-24 DIAGNOSIS — H409 Unspecified glaucoma: Secondary | ICD-10-CM | POA: Insufficient documentation

## 2020-09-24 DIAGNOSIS — M25512 Pain in left shoulder: Secondary | ICD-10-CM

## 2020-09-24 DIAGNOSIS — E559 Vitamin D deficiency, unspecified: Secondary | ICD-10-CM

## 2020-09-24 DIAGNOSIS — Z6839 Body mass index (BMI) 39.0-39.9, adult: Secondary | ICD-10-CM

## 2020-09-25 NOTE — Telephone Encounter (Signed)
Pt last seen by Dr. Wallace.  

## 2020-10-02 NOTE — Progress Notes (Signed)
Chief Complaint:   OBESITY Raymond Jordan is here to discuss his progress with his obesity treatment plan along with follow-up of his obesity related diagnoses.   Today's visit was #: 10 Starting weight: 292 lbs Starting date: 01/18/2020 Today's weight: 282 lbs Today's date: 09/24/2020 Total lbs lost to date: 10 lbs Body mass index is 39.33 kg/m.  Total weight loss percentage to date: -3.42%  Interim History:  Raymond Jordan is scheduled for surgery with [facility] on February 21.  He is not taking oxycodone, but is taking Tylenol instead.  He has been going to PT.  Not taking gabapentin.  ESI helped his lumber back pain for 1 month.  He says he had to cancel water aerobics.  Current Meal Plan: the Category 3 Plan for 70% of the time.  Current Exercise Plan: Increase walking/core strength..  Assessment/Plan:   1. Chronic left shoulder pain Raymond Jordan is scheduled for surgery on February 21.  2. Osteoarthritis of spine with radiculopathy, lumbar region He has been going to PT and getting ESI on his lumbar spine.  3. At risk for activity intolerance Raymond Jordan was given approximately 8 minutes of counseling today regarding his increased risk for exercise intolerance.  We discussed patient's specific personal and medical issues that raise our concern.  He was advised of strategies to prevent injury and ways to improve his cardiopulmonary fitness levels slowly over time.  We additionally discussed various fitness trackers and smart phone apps to help motivate the patient to stay on track.   4. Class 2 severe obesity with serious comorbidity and body mass index (BMI) of 39.0 to 39.9 in adult, unspecified obesity type [facility])  Course: Raymond Jordan is currently in the action stage of change. As such, his goal is to continue with weight loss efforts.   Nutrition goals: He has agreed to the Category 3 Plan.   Exercise goals: PT.  Behavioral modification strategies: increasing lean protein intake, decreasing  simple carbohydrates, increasing vegetables and increasing water intake.  Raymond Jordan has agreed to follow-up with our clinic in 6 weeks. He was informed of the importance of frequent follow-up visits to maximize his success with intensive lifestyle modifications for his multiple health conditions.   Objective:   Blood pressure (!) 145/79, pulse 76, temperature 98.1 F (36.7 C), temperature source Oral, height 5\' 11"  (1.803 m), weight 282 lb (127.9 kg), SpO2 97 %. Body mass index is 39.33 kg/m.  General: Cooperative, alert, well developed, in no acute distress. HEENT: Conjunctivae and lids unremarkable. Cardiovascular: Regular rhythm.  Lungs: Normal work of breathing. Neurologic: No focal deficits.   Lab Results  Component Value Date   CREATININE 1.2 06/03/2020   BUN 19 06/03/2020   NA 139 01/18/2020   K 3.7 01/18/2020   CL 103 01/18/2020   CO2 27 01/18/2020   Lab Results  Component Value Date   ALT 15 01/18/2020   AST 22 01/18/2020   ALKPHOS 74 01/18/2020   BILITOT 0.3 01/18/2020   Lab Results  Component Value Date   HGBA1C 6.3 06/03/2020   HGBA1C 6.2 (H) 01/18/2020   Lab Results  Component Value Date   INSULIN 27.9 (H) 01/18/2020   Lab Results  Component Value Date   TSH 2.040 01/18/2020   Lab Results  Component Value Date   CHOL 123 01/04/2020   HDL 43 01/04/2020   LDLCALC 62 01/04/2020   TRIG 92 01/04/2020   CHOLHDL 2.9 01/04/2020   Lab Results  Component Value Date   WBC 5.8  01/18/2020   HGB 12.5 (L) 01/18/2020   HCT 37.4 (L) 01/18/2020   MCV 82 01/18/2020   PLT 178 01/18/2020   Attestation Statements:   Reviewed by clinician on day of visit: allergies, medications, problem list, medical history, surgical history, family history, social history, and previous encounter notes.  I, Insurance claims handler, CMA, am acting as transcriptionist for Helane Rima, DO  I have reviewed the above documentation for accuracy and completeness, and I agree with the above. Helane Rima, DO

## 2020-10-03 ENCOUNTER — Telehealth (INDEPENDENT_AMBULATORY_CARE_PROVIDER_SITE_OTHER): Payer: Self-pay

## 2020-10-03 NOTE — Telephone Encounter (Signed)
Pt called wants to know if he needs to stay on Vit D-2?  States pharmacy told him refill was denied.

## 2020-10-29 NOTE — Telephone Encounter (Signed)
Encounter closed

## 2020-11-06 ENCOUNTER — Encounter (INDEPENDENT_AMBULATORY_CARE_PROVIDER_SITE_OTHER): Payer: Self-pay | Admitting: Family Medicine

## 2020-11-06 ENCOUNTER — Other Ambulatory Visit: Payer: Self-pay

## 2020-11-06 ENCOUNTER — Ambulatory Visit (INDEPENDENT_AMBULATORY_CARE_PROVIDER_SITE_OTHER): Payer: 59 | Admitting: Family Medicine

## 2020-11-06 VITALS — BP 134/77 | HR 55 | Temp 98.1°F | Ht 71.0 in | Wt 285.0 lb

## 2020-11-06 DIAGNOSIS — R7303 Prediabetes: Secondary | ICD-10-CM

## 2020-11-06 DIAGNOSIS — M4726 Other spondylosis with radiculopathy, lumbar region: Secondary | ICD-10-CM

## 2020-11-06 DIAGNOSIS — E65 Localized adiposity: Secondary | ICD-10-CM

## 2020-11-06 DIAGNOSIS — I10 Essential (primary) hypertension: Secondary | ICD-10-CM | POA: Diagnosis not present

## 2020-11-06 DIAGNOSIS — E559 Vitamin D deficiency, unspecified: Secondary | ICD-10-CM

## 2020-11-06 DIAGNOSIS — Z9189 Other specified personal risk factors, not elsewhere classified: Secondary | ICD-10-CM

## 2020-11-06 DIAGNOSIS — Z6841 Body Mass Index (BMI) 40.0 and over, adult: Secondary | ICD-10-CM

## 2020-11-07 LAB — LIPID PANEL
Chol/HDL Ratio: 2.7 ratio (ref 0.0–5.0)
Cholesterol, Total: 125 mg/dL (ref 100–199)
HDL: 46 mg/dL (ref >39)
LDL Chol Calc (NIH): 64 mg/dL (ref 0–99)
Triglycerides: 71 mg/dL (ref 0–149)
VLDL Cholesterol Cal: 15 mg/dL (ref 5–40)

## 2020-11-07 LAB — CBC WITH DIFFERENTIAL/PLATELET
Basophils Absolute: 0 10*3/uL (ref 0.0–0.2)
Basos: 0 %
EOS (ABSOLUTE): 0.1 10*3/uL (ref 0.0–0.4)
Eos: 2 %
Hematocrit: 40.3 % (ref 37.5–51.0)
Hemoglobin: 13 g/dL (ref 13.0–17.7)
Immature Grans (Abs): 0 10*3/uL (ref 0.0–0.1)
Immature Granulocytes: 0 %
Lymphocytes Absolute: 2.2 10*3/uL (ref 0.7–3.1)
Lymphs: 40 %
MCH: 27.5 pg (ref 26.6–33.0)
MCHC: 32.3 g/dL (ref 31.5–35.7)
MCV: 85 fL (ref 79–97)
Monocytes Absolute: 0.5 10*3/uL (ref 0.1–0.9)
Monocytes: 10 %
Neutrophils Absolute: 2.5 10*3/uL (ref 1.4–7.0)
Neutrophils: 48 %
Platelets: 174 10*3/uL (ref 150–450)
RBC: 4.72 x10E6/uL (ref 4.14–5.80)
RDW: 14.1 % (ref 11.6–15.4)
WBC: 5.4 10*3/uL (ref 3.4–10.8)

## 2020-11-07 LAB — COMPREHENSIVE METABOLIC PANEL
ALT: 13 IU/L (ref 0–44)
AST: 16 IU/L (ref 0–40)
Albumin/Globulin Ratio: 1.2 (ref 1.2–2.2)
Albumin: 4.4 g/dL (ref 3.8–4.9)
Alkaline Phosphatase: 68 IU/L (ref 44–121)
BUN/Creatinine Ratio: 14 (ref 9–20)
BUN: 18 mg/dL (ref 6–24)
Bilirubin Total: 0.2 mg/dL (ref 0.0–1.2)
CO2: 23 mmol/L (ref 20–29)
Calcium: 9.3 mg/dL (ref 8.7–10.2)
Chloride: 105 mmol/L (ref 96–106)
Creatinine, Ser: 1.25 mg/dL (ref 0.76–1.27)
Globulin, Total: 3.6 g/dL (ref 1.5–4.5)
Glucose: 95 mg/dL (ref 65–99)
Potassium: 3.6 mmol/L (ref 3.5–5.2)
Sodium: 143 mmol/L (ref 134–144)
Total Protein: 8 g/dL (ref 6.0–8.5)
eGFR: 67 mL/min/{1.73_m2} (ref >59)

## 2020-11-07 LAB — HEMOGLOBIN A1C
Est. average glucose Bld gHb Est-mCnc: 128 mg/dL
Hgb A1c MFr Bld: 6.1 % — ABNORMAL HIGH (ref 4.8–5.6)

## 2020-11-07 LAB — VITAMIN D 25 HYDROXY (VIT D DEFICIENCY, FRACTURES): Vit D, 25-Hydroxy: 45.2 ng/mL (ref 30.0–100.0)

## 2020-11-13 NOTE — Progress Notes (Signed)
Chief Complaint:   OBESITY Raymond Jordan is here to discuss his progress with his obesity treatment plan along with follow-up of his obesity related diagnoses.   Today's visit was #: 11 Starting weight: 292 lbs Starting date: 01/18/2020 Today's weight: 285 lbs Today's date: 11/06/2020 Weight change since last visit:  +3 lbs Total lbs lost to date: 7 lbs Body mass index is 39.75 kg/m.  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  He also has worsening neck/low back pain.  He is still in PT.  Will see Raymond Jordan for possible ESI lumbar #2.  Current Meal Plan: the Category 3 Plan for 60% of the time.  Current Exercise Plan: Walking/yard work for 30+ minutes 2-3 times per week.  Assessment/Plan:   1. Vitamin D deficiency Not at goal. Current vitamin D is 22.1, tested on 01/18/2020. Optimal goal > 50 ng/dL.  She is taking vitamin D 50,000 IU weekly.  Plan: Continue to take prescription Vitamin D @50 ,000 IU every week as prescribed.  Will check vitamin D level today.  - VITAMIN D 25 Hydroxy (Vit-D Deficiency, Fractures)  2. Visceral obesity Current visceral fat rating: 23. Visceral fat rating should be < 13. Visceral adipose tissue is a hormonally active component of total body fat. This body composition phenotype is associated with medical disorders such as metabolic syndrome, cardiovascular disease and several malignancies including prostate, breast, and colorectal cancers. Starting goal: Lose 7-10% of starting weight.   Plan:  Check labs today, as per below.  - CBC with Differential/Platelet - Comprehensive metabolic panel - Hemoglobin A1c - Lipid panel  3. Osteoarthritis of spine with radiculopathy, lumbar region Raymond Jordan will see Raymond Jordan regarding possible second ESI on his lumbar spine.  4. Essential hypertension Not at goal. Medications: chlorthalidone 25 mg daily, losartan 25 mg daily, metoprolol 50 mg  daily.   Plan: Avoid buying foods that are: processed, frozen, or prepackaged to avoid excess salt. We will watch for signs of hypotension as he continues lifestyle modifications.   BP Readings from Last 3 Encounters:  11/06/20 134/77  09/24/20 (!) 145/79  07/31/20 138/84   Lab Results  Component Value Date   CREATININE 1.25 11/06/2020   5. Prediabetes Not at goal. Goal is HgbA1c < 5.7.  Medication: None.    Plan:  He will continue to focus on protein-rich, low simple carbohydrate foods. We reviewed the importance of hydration, regular exercise for stress reduction, and restorative sleep.  Will check A1c today.  Lab Results  Component Value Date   HGBA1C 6.1 (H) 11/06/2020   Lab Results  Component Value Date   INSULIN 27.9 (H) 01/18/2020   - Hemoglobin A1c  6. At risk for heart disease Due to Raymond Jordan's current state of health and medical condition(s), he is at a higher risk for heart disease.  This puts the patient at much greater risk to subsequently develop cardiopulmonary conditions that can significantly affect patient's quality of life in a negative manner.    At least 8 minutes were spent on counseling Raymond Jordan about these concerns today. Evidence-based interventions for health behavior change were utilized today including the discussion of self monitoring techniques, problem-solving barriers, and SMART goal setting techniques.  Specifically, regarding patient's less desirable eating habits and patterns, we employed the technique of small changes when [name] has not been able to fully commit to his prudent nutritional plan.  7. Obesity, current BMI 39.8  Course: Raymond Jordan is currently in the action stage of change. As such, his goal is to continue with weight loss efforts.   Nutrition goals: He has agreed to the Category 3 Plan.   Exercise goals: For substantial health benefits, adults should do at least 150 minutes (2 hours and 30 minutes) a week of moderate-intensity, or 75  minutes (1 hour and 15 minutes) a week of vigorous-intensity aerobic physical activity, or an equivalent combination of moderate- and vigorous-intensity aerobic activity. Aerobic activity should be performed in episodes of at least 10 minutes, and preferably, it should be spread throughout the week.  Behavioral modification strategies: increasing lean protein intake, decreasing simple carbohydrates and increasing vegetables.  Raymond Jordan has agreed to follow-up with our clinic in 4 weeks. He was informed of the importance of frequent follow-up visits to maximize his success with intensive lifestyle modifications for his multiple health conditions.   Objective:   Blood pressure 134/77, pulse (!) 55, temperature 98.1 F (36.7 C), temperature source Oral, height 5\' 11"  (1.803 m), weight 285 lb (129.3 kg), SpO2 99 %. Body mass index is 39.75 kg/m.  General: Cooperative, alert, well developed, in no acute distress. HEENT: Conjunctivae and lids unremarkable. Cardiovascular: Regular rhythm.  Lungs: Normal work of breathing. Neurologic: No focal deficits.   Lab Results  Component Value Date   CREATININE 1.25 11/06/2020   BUN 18 11/06/2020   NA 143 11/06/2020   K 3.6 11/06/2020   CL 105 11/06/2020   CO2 23 11/06/2020   Lab Results  Component Value Date   ALT 13 11/06/2020   AST 16 11/06/2020   ALKPHOS 68 11/06/2020   BILITOT 0.2 11/06/2020   Lab Results  Component Value Date   HGBA1C 6.1 (H) 11/06/2020   HGBA1C 6.3 06/03/2020   HGBA1C 6.2 (H) 01/18/2020   Lab Results  Component Value Date   INSULIN 27.9 (H) 01/18/2020   Lab Results  Component Value Date   TSH 2.040 01/18/2020   Lab Results  Component Value Date   CHOL 125 11/06/2020   HDL 46 11/06/2020   LDLCALC 64 11/06/2020   TRIG 71 11/06/2020   CHOLHDL 2.7 11/06/2020   Lab Results  Component Value Date   WBC 5.4 11/06/2020   HGB 13.0 11/06/2020   HCT 40.3 11/06/2020   MCV 85 11/06/2020   PLT 174 11/06/2020    Attestation Statements:   Reviewed by clinician on day of visit: allergies, medications, problem list, medical history, surgical history, family history, social history, and previous encounter notes.  I, 01/06/2021, CMA, am acting as transcriptionist for Insurance claims handler, DO  I have reviewed the above documentation for accuracy and completeness, and I agree with the above. Helane Rima, DO

## 2020-12-22 ENCOUNTER — Other Ambulatory Visit: Payer: Self-pay | Admitting: Cardiology

## 2020-12-22 DIAGNOSIS — E7800 Pure hypercholesterolemia, unspecified: Secondary | ICD-10-CM

## 2021-01-09 ENCOUNTER — Ambulatory Visit (INDEPENDENT_AMBULATORY_CARE_PROVIDER_SITE_OTHER): Payer: 59 | Admitting: Family Medicine

## 2021-01-09 ENCOUNTER — Encounter (INDEPENDENT_AMBULATORY_CARE_PROVIDER_SITE_OTHER): Payer: Self-pay | Admitting: Family Medicine

## 2021-01-09 ENCOUNTER — Other Ambulatory Visit: Payer: Self-pay

## 2021-01-09 VITALS — BP 137/76 | HR 60 | Temp 98.0°F | Ht 71.0 in | Wt 296.0 lb

## 2021-01-09 DIAGNOSIS — M4726 Other spondylosis with radiculopathy, lumbar region: Secondary | ICD-10-CM

## 2021-01-09 DIAGNOSIS — I10 Essential (primary) hypertension: Secondary | ICD-10-CM

## 2021-01-09 DIAGNOSIS — R7301 Impaired fasting glucose: Secondary | ICD-10-CM | POA: Diagnosis not present

## 2021-01-09 DIAGNOSIS — Z9189 Other specified personal risk factors, not elsewhere classified: Secondary | ICD-10-CM | POA: Diagnosis not present

## 2021-01-09 DIAGNOSIS — Z6841 Body Mass Index (BMI) 40.0 and over, adult: Secondary | ICD-10-CM

## 2021-01-09 DIAGNOSIS — E65 Localized adiposity: Secondary | ICD-10-CM

## 2021-01-09 MED ORDER — TIRZEPATIDE 2.5 MG/0.5ML ~~LOC~~ SOAJ: 2.5000 mg | SUBCUTANEOUS | 0 refills | Status: DC

## 2021-01-10 ENCOUNTER — Ambulatory Visit: Payer: 59 | Admitting: Cardiology

## 2021-01-10 ENCOUNTER — Encounter: Payer: Self-pay | Admitting: Cardiology

## 2021-01-10 VITALS — BP 112/86 | HR 52 | Ht 71.0 in | Wt 299.6 lb

## 2021-01-10 DIAGNOSIS — E7800 Pure hypercholesterolemia, unspecified: Secondary | ICD-10-CM | POA: Diagnosis not present

## 2021-01-10 DIAGNOSIS — G4733 Obstructive sleep apnea (adult) (pediatric): Secondary | ICD-10-CM

## 2021-01-10 DIAGNOSIS — I10 Essential (primary) hypertension: Secondary | ICD-10-CM

## 2021-01-10 DIAGNOSIS — Z9989 Dependence on other enabling machines and devices: Secondary | ICD-10-CM

## 2021-01-10 DIAGNOSIS — I2510 Atherosclerotic heart disease of native coronary artery without angina pectoris: Secondary | ICD-10-CM | POA: Diagnosis not present

## 2021-01-10 MED ORDER — PRAVASTATIN SODIUM 80 MG PO TABS: 80.0000 mg | ORAL_TABLET | Freq: Every day | ORAL | 3 refills | Status: DC

## 2021-01-10 MED ORDER — LOSARTAN POTASSIUM 25 MG PO TABS: 25.0000 mg | ORAL_TABLET | Freq: Every day | ORAL | 3 refills | Status: DC

## 2021-01-10 MED ORDER — EZETIMIBE 10 MG PO TABS: 10.0000 mg | ORAL_TABLET | Freq: Every day | ORAL | 3 refills | Status: DC

## 2021-01-10 MED ORDER — CHLORTHALIDONE 25 MG PO TABS: 25.0000 mg | ORAL_TABLET | Freq: Every day | ORAL | 3 refills | Status: DC

## 2021-01-10 MED ORDER — METOPROLOL TARTRATE 50 MG PO TABS: 50.0000 mg | ORAL_TABLET | Freq: Two times a day (BID) | ORAL | 3 refills | Status: DC

## 2021-01-10 MED ORDER — CLOPIDOGREL BISULFATE 75 MG PO TABS: 75.0000 mg | ORAL_TABLET | Freq: Every day | ORAL | 1 refills | Status: DC

## 2021-01-10 NOTE — Progress Notes (Signed)
Cardiology Office Note:    Date:  01/10/2021   ID:  Raymond BickerHoward N Swavely Jr., DOB 11-22-1960, MRN [id number]  PCP:  Shon Haleimberlake, Kathryn S, MD  Cardiologist:  Armanda Magicraci Avri Paiva, MD    Referring MD: Shon Haleimberlake, Kathryn S, *   Chief Complaint  Patient presents with   Sleep Apnea   Coronary Artery Disease   Hypertension   Hyperlipidemia     History of Present Illness:    Raymond BickerHoward N Rosiak Jr. is a 60 y.o. male with a hx of HTN, OSA, obesity, ASCAD s/p non-STEMI with cath showing 99% prox RCA s/p DES (now on DAPT) with normal LVF.    He is here today for followup and is doing well.  He denies any chest pain or pressure, SOB, DOE, PND, orthopnea, LE edema, dizziness, palpitations or syncope. He is compliant with his meds and is tolerating meds with no SE.     He is doing well with his CPAP device and thinks that he has gotten used to it.  He tolerates the mask and feels the pressure is adequate.  Since going on CPAP he feels rested in the am and has no significant daytime sleepiness.  He denies any significant mouth or nasal dryness or nasal congestion.  He does not think that he snores.     Past Medical History:  Diagnosis Date   Allergic rhinitis    /asthma   Anxiety    Arthritis    "shoulders, back, knees" (09/16/2015)   Bulging of intervertebral disc between L4 and L5    CAD (coronary artery disease), native coronary artery 09/15/2015   s/p NSTEMI 09/2015 with cath showing 99% RCA s/p DES now on DAPT   Childhood asthma    Chronic back pain    Constipation    ED (erectile dysfunction)    Essential hypertension, benign 12/19/2012   GERD (gastroesophageal reflux disease)    after taking meds , not treated at present   GERD (gastroesophageal reflux disease)    Glaucoma    Health examination of defined subpopulation 12/19/2012   Heart attack (HCC)    Hyperlipidemia LDL goal <70    Joint pain    Lower extremity edema    Meningitis hospitalized 07/2009   rule out bells palsy ruled out  menigitis   Obesity 06/26/2013   OSA on CPAP 12/19/2012   Osteoarthritis    OA knees, shoulders; DDD lumbar spine   Pure hypercholesterolemia 12/19/2012   Seasonal allergies    SOB (shortness of breath)     Past Surgical History:  Procedure Laterality Date   ANKLE SURGERY  1985   bone spur removal ankle.   CARDIAC CATHETERIZATION N/A 09/16/2015   Procedure: Left Heart Cath and Coronary Angiography;  Surgeon: Kathleene Hazelhristopher D McAlhany, MD;  [facility];  Service: Cardiovascular;  Laterality: N/A;   CARDIAC CATHETERIZATION N/A 09/16/2015   Procedure: Coronary Stent Intervention;  Surgeon: Kathleene Hazelhristopher D McAlhany, MD;  [facility];  Service: Cardiovascular;  Laterality: N/A;   MYRINGOTOMY WITH TUBE PLACEMENT Left ~ 07/2009   NASAL SINUS SURGERY  ~ 2007   "& fixed my septum"   SHOULDER OPEN ROTATOR CUFF REPAIR Right 10/2013   TRIGGER FINGER RELEASE Right 07/01/2017   Procedure: RELEASE TRIGGER FINGER/A-1 PULLEY RIGHT THUMB;  Surgeon: Betha LoaKuzma, Kevin, MD;  Location: [facility];  Service: Orthopedics;  Laterality: Right;   VASECTOMY     WISDOM TOOTH EXTRACTION  ~ 1992    Current Medications:  Current Meds  Medication Sig   aspirin EC 81 MG tablet Take 81 mg by mouth daily.   B Complex-Biotin-FA (SUPER B-COMPLEX) TABS Take 1 tablet by mouth daily.   cetirizine (ZYRTEC) 10 MG tablet Take 10 mg by mouth daily as needed for allergies.    chlorthalidone (HYGROTON) 25 MG tablet Take 1 tablet (25 mg total) by mouth daily.   clopidogrel (PLAVIX) 75 MG tablet Take 1 tablet (75 mg total) by mouth daily.   diclofenac Sodium (VOLTAREN) 1 % GEL Apply topically in the morning and at bedtime.   ezetimibe (ZETIA) 10 MG tablet TAKE 1 TABLET BY MOUTH  DAILY   fluticasone (FLONASE) 50 MCG/ACT nasal spray Place into both nostrils as needed for allergies or rhinitis.   latanoprost (XALATAN) 0.005 % ophthalmic solution Place 1 drop into both eyes at bedtime.   losartan  (COZAAR) 25 MG tablet TAKE 1 TABLET BY MOUTH  DAILY   metoprolol tartrate (LOPRESSOR) 50 MG tablet TAKE 1 TABLET BY MOUTH  TWICE DAILY   nitroGLYCERIN (NITROSTAT) 0.4 MG SL tablet Place 1 tablet (0.4 mg total) under the tongue every 5 (five) minutes as needed for chest pain.   pravastatin (PRAVACHOL) 80 MG tablet TAKE 1 TABLET BY MOUTH  DAILY   timolol (TIMOPTIC-XR) 0.5 % ophthalmic gel-forming    tirzepatide (MOUNJARO) 2.5 MG/0.5ML Pen Inject 2.5 mg into the skin once a week.   Vitamin D, Ergocalciferol, (DRISDOL) 1.25 MG (50000 UNIT) CAPS capsule TAKE 1 CAPSULE BY MOUTH  EVERY 7 DAYS     Allergies:   Atorvastatin, Lisinopril, and Rosuvastatin   Social History   Socioeconomic History   Marital status: Married    Spouse name: Not on file   Number of children: 2   Years of education: Not on file   Highest education level: Not on file  Occupational History   Occupation: Truck driver  Tobacco Use   Smoking status: Former    Pack years: 0.00   Smokeless tobacco: Never   Tobacco comments:    "smoked 3 packs of cigaretes in my whole life"  Vaping Use   Vaping Use: Never used  Substance and Sexual Activity   Alcohol use: Yes    Comment: 09/16/2015 "might have a couple drinks q couple months"   Drug use: No   Sexual activity: Yes  Other Topics Concern   Not on file  Social History Narrative   Marital status: Married x 30 years      Children:  2 children; 1 stepchild; grandchild and two step grandchildren.      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      Tobacco:  No tobacco      Alcohol: + weekends sometimes; two drinks per month.      Drugs: none      Exercise:  Walking and cycling.   Social Determinants of Health   Financial Resource Strain: Not on file  Food Insecurity: Not on file  Transportation Needs: Not on file  Physical Activity: Not on file  Stress: Not on file  Social Connections:  Not on file     Family History: The patient's family history includes Arthritis in his father; Asthma in his mother; Cancer in his father; Depression in his mother; Diabetes in his mother; Heart attack in his father; Heart disease in his father; Hypertension in his father and mother; Obesity in his mother; Sarcoidosis in his mother; Stroke  in his father and mother.  ROS:   Please see the history of present illness.    ROS  All other systems reviewed and negative.   EKGs/Labs/Other Studies Reviewed:    The following studies were reviewed today: PAP compliance download, EKG  EKG:  EKG is  ordered today.  The ekg ordered today demonstrates Sinus bradycardia with no ST changes  Recent Labs: 01/18/2020: TSH 2.040 11/06/2020: ALT 13; BUN 18; Creatinine, Ser 1.25; Hemoglobin 13.0; Platelets 174; Potassium 3.6; Sodium 143   Recent Lipid Panel    Component Value Date/Time   CHOL 125 11/06/2020 1107   TRIG 71 11/06/2020 1107   HDL 46 11/06/2020 1107   CHOLHDL 2.7 11/06/2020 1107   CHOLHDL 2.7 05/04/2016 0736   VLDL 14 05/04/2016 0736   LDLCALC 64 11/06/2020 1107    Physical Exam:    VS:  BP 112/86   Pulse (!) 52   Ht 5\' 11"  (1.803 m)   Wt 299 lb 9.6 oz (135.9 kg)   SpO2 97%   BMI 41.79 kg/m     Wt Readings from Last 3 Encounters:  01/10/21 299 lb 9.6 oz (135.9 kg)  01/09/21 296 lb (134.3 kg)  11/06/20 285 lb (129.3 kg)     GEN: Well nourished, well developed in no acute distress HEENT: Normal NECK: No JVD; No carotid bruits LYMPHATICS: No lymphadenopathy CARDIAC:RRR, no murmurs, rubs, gallops RESPIRATORY:  Clear to auscultation without rales, wheezing or rhonchi  ABDOMEN: Soft, non-tender, non-distended MUSCULOSKELETAL:  No edema; No deformity  SKIN: Warm and dry NEUROLOGIC:  Alert and oriented x 3 PSYCHIATRIC:  Normal affect   ASSESSMENT:    1. OSA on CPAP   2. Essential hypertension, benign   3. Coronary artery disease involving native coronary artery of native  heart without angina pectoris   4. Pure hypercholesterolemia    PLAN:    In order of problems listed above:  1.  OSA - The patient is tolerating PAP therapy well without any problems. The PAP download performed by his DME was personally reviewed and interpreted by me today and showed an AHI of 0.8/hr on 11 cm H2O with 100% compliance in using more than 4 hours nightly.  The patient has been using and benefiting from PAP use and will continue to benefit from therapy.     2.  Hypertension  -BP is controlled on exam today -Continue prescription drug management with Chlorthalidone 25mg  daily, Losartan 25mg  daily and Lopressor 50mg  BID>>refilled for 1 year  -would avoid amlodipine due to LE edema -I have personally reviewed and interpreted outside labs performed by patient's PCP which showed SCr 1.25 and K+ 3.6 in may 2022    3.  ASCAD  -s/p NSTEMI with cath showing 99% pRCA s/p DES in 09/2015.  myoview 01/2020 showed no ischemia -he denies any anginal symptoms -Continue prescription drug management with ASA 81mg  daily, Plavix 75mg  daily, Lopressor 50mg  BID and statin>>refilled for 1 year    4.  Hyperlipidemia  -his LDLgoal is < 70 -I have personally reviewed and interpreted outside labs performed by patient's PCP which showed LDL 64, HDL 46, TAGs 71 and ALT 13 in May 2022  -Continue prescription drug management with Pravastatin 80mg  daily and Zetia 10mg  daily >> refilled for 1 year   Medication Adjustments/Labs and Tests Ordered: Current medicines are reviewed at length with the patient today.  Concerns regarding medicines are outlined above.  Orders Placed This Encounter  Procedures   EKG 12-Lead  No orders of the defined types were placed in this encounter.   Signed, Armanda Magic, MD  01/10/2021 10:13 AM    [facility]

## 2021-01-10 NOTE — Patient Instructions (Signed)

## 2021-01-10 NOTE — Addendum Note (Signed)
Addended by: Theresia Majors on: 01/10/2021 10:41 AM   Modules accepted: Orders

## 2021-01-13 ENCOUNTER — Other Ambulatory Visit (INDEPENDENT_AMBULATORY_CARE_PROVIDER_SITE_OTHER): Payer: Self-pay

## 2021-01-13 ENCOUNTER — Telehealth (INDEPENDENT_AMBULATORY_CARE_PROVIDER_SITE_OTHER): Payer: Self-pay

## 2021-01-13 ENCOUNTER — Encounter (INDEPENDENT_AMBULATORY_CARE_PROVIDER_SITE_OTHER): Payer: Self-pay | Admitting: Family Medicine

## 2021-01-13 DIAGNOSIS — R7301 Impaired fasting glucose: Secondary | ICD-10-CM

## 2021-01-13 MED ORDER — TIRZEPATIDE 2.5 MG/0.5ML ~~LOC~~ SOAJ: 2.5000 mg | SUBCUTANEOUS | 0 refills | Status: DC

## 2021-01-13 NOTE — Progress Notes (Signed)
Chief Complaint:   OBESITY Raymond Jordan is here to discuss his progress with his obesity treatment plan along with follow-up of his obesity related diagnoses. See Medical Weight Management Flowsheet for complete bioelectrical impedance results.  Today's visit was #: 12 Starting weight: 292 lbs Starting date: 01/18/2020 Today's weight: 296 lbs Today's date: 01/09/2021 Weight change since last visit: +11 lbs Total lbs lost to date: +4 lbs Body mass index is 41.28 kg/m.   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  Followed by [facility].  He is scheduled for injections on July 24.  Nutrition Plan: the Category 3 Plan for 55% of the time. Activity:  Increased walking.  Assessment/Plan:   1. Impaired fasting glucose Will start Mounjaro 0.25 mg subcutaneously daily. During insulin resistance, several metabolic alterations induce the development of cardiovascular disease. For instance, insulin resistance can induce an imbalance in glucose metabolism that generates chronic hyperglycemia, which in turn triggers oxidative stress and causes an inflammatory response that leads to cell damage. Insulin resistance can also alter systemic lipid metabolism which then leads to the development of dyslipidemia and the well-known lipid triad: (1) high levels of plasma triglycerides, (2) low levels of high-density lipoprotein, and (3) the appearance of small dense low-density lipoproteins. This triad, along with endothelial dysfunction, which can also be induced by aberrant insulin signaling, contribute to atherosclerotic plaque formation.   2. Osteoarthritis of spine with radiculopathy, lumbar region Raymond Jordan is followed by [facility] and is scheduled for injections soon. We will continue to monitor symptoms as they relate to his weight loss journey.  3. Essential hypertension At goal. Medications: chlorthalidone 25 mg daily, Cozaar 25 mg daily, metoprolol 50 mg daily.   Plan:  Avoid buying foods that are: processed, frozen, or prepackaged to avoid excess salt. We will watch for signs of hypotension as he continues lifestyle modifications.  BP Readings from Last 3 Encounters:  01/10/21 112/86  01/09/21 137/76  11/06/20 134/77   Lab Results  Component Value Date   CREATININE 1.25 11/06/2020   4. Visceral obesity Current visceral fat rating: 25. Visceral fat rating should be < 13. Visceral adipose tissue is a hormonally active component of total body fat. This body composition phenotype is associated with medical disorders such as metabolic syndrome, cardiovascular disease and several malignancies including prostate, breast, and colorectal cancers. Starting goal: Lose 7-10% of starting weight.   5. At risk for heart disease Due to Raymond Jordan's current state of health and medical condition(s), he is at a higher risk for heart disease.  This puts the patient at much greater risk to subsequently develop cardiopulmonary conditions that can significantly affect patient's quality of life in a negative manner.    At least 8 minutes were spent on counseling Raymond Jordan about these concerns today, and I stressed the importance of reversing risks factors of obesity, especially truncal and visceral fat, hypertension, hyperlipidemia, and pre-diabetes.  The initial goal is to lose at least 5-10% of starting weight to help reduce these risk factors. Evidence-based interventions for health behavior change were utilized today including the discussion of self monitoring techniques, problem-solving barriers, and SMART goal setting techniques.  Specifically, regarding patient's less desirable eating habits and patterns, we employed the technique of small changes when Raymond Jordan has not been able to fully commit to his prudent nutritional plan.  6. Obesity, current BMI 41.3  Course: Raymond Jordan is currently in the action stage of change. As such, his goal is to continue with  weight loss efforts.    Nutrition goals: He has agreed to the Category 3 Plan.   Exercise goals:  As is.  Behavioral modification strategies: increasing lean protein intake, decreasing simple carbohydrates, increasing vegetables, and increasing water intake.  Raymond Jordan has agreed to follow-up with our clinic in 4 weeks. He was informed of the importance of frequent follow-up visits to maximize his success with intensive lifestyle modifications for his multiple health conditions.   Objective:   Blood pressure 137/76, pulse 60, temperature 98 F (36.7 C), temperature source Oral, height 5\' 11"  (1.803 m), weight 296 lb (134.3 kg), SpO2 98 %. Body mass index is 41.28 kg/m.  General: Cooperative, alert, well developed, in no acute distress. HEENT: Conjunctivae and lids unremarkable. Cardiovascular: Regular rhythm.  Lungs: Normal work of breathing. Neurologic: No focal deficits.   Lab Results  Component Value Date   CREATININE 1.25 11/06/2020   BUN 18 11/06/2020   NA 143 11/06/2020   K 3.6 11/06/2020   CL 105 11/06/2020   CO2 23 11/06/2020   Lab Results  Component Value Date   ALT 13 11/06/2020   AST 16 11/06/2020   ALKPHOS 68 11/06/2020   BILITOT 0.2 11/06/2020   Lab Results  Component Value Date   HGBA1C 6.1 (H) 11/06/2020   HGBA1C 6.3 06/03/2020   HGBA1C 6.2 (H) 01/18/2020   Lab Results  Component Value Date   INSULIN 27.9 (H) 01/18/2020   Lab Results  Component Value Date   TSH 2.040 01/18/2020   Lab Results  Component Value Date   CHOL 125 11/06/2020   HDL 46 11/06/2020   LDLCALC 64 11/06/2020   TRIG 71 11/06/2020   CHOLHDL 2.7 11/06/2020   Lab Results  Component Value Date   VD25OH 45.2 11/06/2020   VD25OH 22.1 (L) 01/18/2020   Lab Results  Component Value Date   WBC 5.4 11/06/2020   HGB 13.0 11/06/2020   HCT 40.3 11/06/2020   MCV 85 11/06/2020   PLT 174 11/06/2020   Attestation Statements:   Reviewed by clinician on day of visit: allergies, medications, problem  list, medical history, surgical history, family history, social history, and previous encounter notes.  I, 01/06/2021, CMA, am acting as transcriptionist for Insurance claims handler, DO  I have reviewed the above documentation for accuracy and completeness, and I agree with the above. Helane Rima, DO

## 2021-01-13 NOTE — Telephone Encounter (Signed)
Last OV with Dr Wallace 

## 2021-01-14 ENCOUNTER — Encounter (INDEPENDENT_AMBULATORY_CARE_PROVIDER_SITE_OTHER): Payer: Self-pay

## 2021-01-14 ENCOUNTER — Encounter (INDEPENDENT_AMBULATORY_CARE_PROVIDER_SITE_OTHER): Payer: Self-pay | Admitting: Family Medicine

## 2021-01-14 DIAGNOSIS — R7301 Impaired fasting glucose: Secondary | ICD-10-CM

## 2021-01-14 NOTE — Telephone Encounter (Signed)
Pt last seen by Dr. Wallace.  

## 2021-01-16 MED ORDER — OZEMPIC (0.25 OR 0.5 MG/DOSE) 2 MG/1.5ML ~~LOC~~ SOPN: 0.2500 mg | PEN_INJECTOR | SUBCUTANEOUS | 0 refills | Status: DC

## 2021-01-20 ENCOUNTER — Encounter (INDEPENDENT_AMBULATORY_CARE_PROVIDER_SITE_OTHER): Payer: Self-pay

## 2021-01-21 ENCOUNTER — Encounter (INDEPENDENT_AMBULATORY_CARE_PROVIDER_SITE_OTHER): Payer: Self-pay | Admitting: Family Medicine

## 2021-01-21 NOTE — Telephone Encounter (Signed)
Last OV with Dr Wallace 

## 2021-01-28 ENCOUNTER — Encounter (INDEPENDENT_AMBULATORY_CARE_PROVIDER_SITE_OTHER): Payer: Self-pay | Admitting: Family Medicine

## 2021-01-28 MED ORDER — METFORMIN HCL 500 MG PO TABS: 500.0000 mg | ORAL_TABLET | Freq: Every day | ORAL | 0 refills | Status: DC

## 2021-02-12 ENCOUNTER — Other Ambulatory Visit (HOSPITAL_COMMUNITY): Payer: Self-pay

## 2021-02-12 ENCOUNTER — Other Ambulatory Visit: Payer: Self-pay

## 2021-02-12 ENCOUNTER — Ambulatory Visit (INDEPENDENT_AMBULATORY_CARE_PROVIDER_SITE_OTHER): Payer: 59 | Admitting: Family Medicine

## 2021-02-12 ENCOUNTER — Encounter (INDEPENDENT_AMBULATORY_CARE_PROVIDER_SITE_OTHER): Payer: Self-pay | Admitting: Family Medicine

## 2021-02-12 VITALS — BP 127/76 | HR 61 | Temp 98.3°F | Ht 71.0 in | Wt 292.0 lb

## 2021-02-12 DIAGNOSIS — I10 Essential (primary) hypertension: Secondary | ICD-10-CM | POA: Diagnosis not present

## 2021-02-12 DIAGNOSIS — R7301 Impaired fasting glucose: Secondary | ICD-10-CM | POA: Diagnosis not present

## 2021-02-12 DIAGNOSIS — Z9189 Other specified personal risk factors, not elsewhere classified: Secondary | ICD-10-CM | POA: Diagnosis not present

## 2021-02-12 DIAGNOSIS — Z6841 Body Mass Index (BMI) 40.0 and over, adult: Secondary | ICD-10-CM

## 2021-02-12 DIAGNOSIS — E65 Localized adiposity: Secondary | ICD-10-CM | POA: Diagnosis not present

## 2021-02-12 MED ORDER — TIRZEPATIDE 2.5 MG/0.5ML ~~LOC~~ SOAJ
2.5000 mg | SUBCUTANEOUS | 0 refills | Status: DC
  Filled 2021-02-12: qty 2, 28d supply, fill #0

## 2021-02-18 NOTE — Progress Notes (Signed)
Chief Complaint:   OBESITY Raymond Jordan is here to discuss his progress with his obesity treatment plan along with follow-up of his obesity related diagnoses.   Today's visit was #: 13 Starting weight: 292 lbs Starting date: 01/18/2020 Today's weight: 292 lbs Today's date: 02/12/2021 Weight change since last visit: -4 lbs Total lbs lost to date: 0 Body mass index is 40.73 kg/m.   Current Meal Plan: the Category 3 Plan for 50% of the time.  Current Exercise Plan: Increased walking.  Interim History: [name] did not pick [place] the [facility] prescription as it was denied by his insurance and he thought that he was unable to use the savings card. He has been taking Metformin with no side effects.   Assessment/Plan:   1. Impaired fasting glucose, with polpyhagia Will start Mounjaro 2.5 mg subcutaneously weekly During insulin resistance, several metabolic alterations induce the development of cardiovascular disease. For instance, insulin resistance can induce an imbalance in glucose metabolism that generates chronic hyperglycemia, which in turn triggers oxidative stress and causes an inflammatory response that leads to cell damage. Insulin resistance can also alter systemic lipid metabolism which then leads to the development of dyslipidemia and the well-known lipid triad: (1) high levels of plasma triglycerides, (2) low levels of high-density lipoprotein, and (3) the appearance of small dense low-density lipoproteins. This triad, along with endothelial dysfunction, which can also be induced by aberrant insulin signaling, contribute to atherosclerotic plaque formation.   - Start tirzepatide [facility]) 2.5 MG/0.5ML Pen; Inject 2.5 mg into the skin once a week.  Dispense: 2 mL; Refill: 0  2. Essential hypertension Controlled. Medications: chlorthalidone 25 mg daily, Cozaar 25 mg daily, metoprolol 50 mg daily.   Plan: Avoid buying foods that are: processed, frozen, or prepackaged to avoid excess  salt. We will watch for signs of hypotension as he continues lifestyle modifications. We will continue to monitor closely alongside his PCP and/or Specialist.  Regular follow up with PCP and specialists was also encouraged.   BP Readings from Last 3 Encounters:  02/12/21 127/76  01/10/21 112/86  01/09/21 137/76   Lab Results  Component Value Date   CREATININE 1.25 11/06/2020   3. Visceral obesity Current visceral fat rating: 24. Visceral fat rating should be < 13. Visceral adipose tissue is a hormonally active component of total body fat. This body composition phenotype is associated with medical disorders such as metabolic syndrome, cardiovascular disease and several malignancies including prostate, breast, and colorectal cancers. Starting goal: Lose 7-10% of starting weight.   4. At risk for heart disease Due to Raymond Jordan's current state of health and medical condition(s), he is at a higher risk for heart disease.   This puts the patient at much greater risk to subsequently develop cardiopulmonary conditions that can significantly affect patient's quality of life in a negative manner as well.    At least 9 minutes was spent on counseling [name] about these concerns today. Initial goal is to lose at least 5-10% of starting weight to help reduce these risk factors.  We will continue to reassess these conditions on a fairly regular basis in an attempt to decrease patient's overall morbidity and mortality.  Evidence-based interventions for health behavior change were utilized today including the discussion of self monitoring techniques, problem-solving barriers and SMART goal setting techniques.  Specifically regarding patient's less desirable eating habits and patterns, we employed the technique of small changes when Raymond Jordan has not been able to fully commit to his prudent nutritional plan.  5. Obesity, current BMI 40.8 Course: [name] is currently in the action stage of change. As such, his goal is  to continue with weight loss efforts.   Nutrition goals: He has agreed to the Category 3 Plan.   Exercise goals: As is.  Behavioral modification strategies: increasing lean protein intake, decreasing simple carbohydrates, and increasing vegetables.  [name] has agreed to follow-up with our clinic in 4 weeks. He was informed of the importance of frequent follow-up visits to maximize his success with intensive lifestyle modifications for his multiple health conditions.   Objective:   Blood pressure 127/76, pulse 61, temperature 98.3 F (36.8 C), temperature source Oral, height 5\' 11"  (1.803 m), weight 292 lb (132.5 kg), SpO2 98 %. Body mass index is 40.73 kg/m.  General: Cooperative, alert, well developed, in no acute distress. HEENT: Conjunctivae and lids unremarkable. Cardiovascular: Regular rhythm.  Lungs: Normal work of breathing. Neurologic: No focal deficits.   Lab Results  Component Value Date   CREATININE 1.25 11/06/2020   BUN 18 11/06/2020   NA 143 11/06/2020   K 3.6 11/06/2020   CL 105 11/06/2020   CO2 23 11/06/2020   Lab Results  Component Value Date   ALT 13 11/06/2020   AST 16 11/06/2020   ALKPHOS 68 11/06/2020   BILITOT 0.2 11/06/2020   Lab Results  Component Value Date   HGBA1C 6.1 (H) 11/06/2020   HGBA1C 6.3 06/03/2020   HGBA1C 6.2 (H) 01/18/2020   Lab Results  Component Value Date   INSULIN 27.9 (H) 01/18/2020   Lab Results  Component Value Date   TSH 2.040 01/18/2020   Lab Results  Component Value Date   CHOL 125 11/06/2020   HDL 46 11/06/2020   LDLCALC 64 11/06/2020   TRIG 71 11/06/2020   CHOLHDL 2.7 11/06/2020   Lab Results  Component Value Date   VD25OH 45.2 11/06/2020   VD25OH 22.1 (L) 01/18/2020   Lab Results  Component Value Date   WBC 5.4 11/06/2020   HGB 13.0 11/06/2020   HCT 40.3 11/06/2020   MCV 85 11/06/2020   PLT 174 11/06/2020   Attestation Statements:   Reviewed by clinician on day of visit: allergies,  medications, problem list, medical history, surgical history, family history, social history, and previous encounter notes.  01/06/2021 [place]bach, CMA, am acting as 12-10-1970 for Energy manager, DO.  I have reviewed the above documentation for accuracy and completeness, and I agree with the above. [employer], DO

## 2021-03-11 ENCOUNTER — Ambulatory Visit (INDEPENDENT_AMBULATORY_CARE_PROVIDER_SITE_OTHER): Payer: 59 | Admitting: Family Medicine

## 2021-03-11 ENCOUNTER — Other Ambulatory Visit: Payer: Self-pay

## 2021-03-11 ENCOUNTER — Encounter (INDEPENDENT_AMBULATORY_CARE_PROVIDER_SITE_OTHER): Payer: Self-pay | Admitting: Family Medicine

## 2021-03-11 ENCOUNTER — Other Ambulatory Visit (HOSPITAL_COMMUNITY): Payer: Self-pay

## 2021-03-11 VITALS — BP 127/77 | HR 60 | Temp 97.5°F | Ht 71.0 in | Wt 280.0 lb

## 2021-03-11 DIAGNOSIS — E66813 Obesity, class 3: Secondary | ICD-10-CM

## 2021-03-11 DIAGNOSIS — I10 Essential (primary) hypertension: Secondary | ICD-10-CM | POA: Diagnosis not present

## 2021-03-11 DIAGNOSIS — Z9989 Dependence on other enabling machines and devices: Secondary | ICD-10-CM

## 2021-03-11 DIAGNOSIS — R7301 Impaired fasting glucose: Secondary | ICD-10-CM | POA: Diagnosis not present

## 2021-03-11 DIAGNOSIS — Z9189 Other specified personal risk factors, not elsewhere classified: Secondary | ICD-10-CM

## 2021-03-11 DIAGNOSIS — E782 Mixed hyperlipidemia: Secondary | ICD-10-CM

## 2021-03-11 DIAGNOSIS — Z6841 Body Mass Index (BMI) 40.0 and over, adult: Secondary | ICD-10-CM

## 2021-03-11 DIAGNOSIS — G4733 Obstructive sleep apnea (adult) (pediatric): Secondary | ICD-10-CM | POA: Diagnosis not present

## 2021-03-12 ENCOUNTER — Encounter (INDEPENDENT_AMBULATORY_CARE_PROVIDER_SITE_OTHER): Payer: Self-pay | Admitting: Family Medicine

## 2021-03-12 ENCOUNTER — Other Ambulatory Visit (INDEPENDENT_AMBULATORY_CARE_PROVIDER_SITE_OTHER): Payer: Self-pay | Admitting: Family Medicine

## 2021-03-12 ENCOUNTER — Other Ambulatory Visit (HOSPITAL_COMMUNITY): Payer: Self-pay

## 2021-03-12 DIAGNOSIS — R7301 Impaired fasting glucose: Secondary | ICD-10-CM

## 2021-03-12 MED ORDER — TIRZEPATIDE 5 MG/0.5ML ~~LOC~~ SOAJ
5.0000 mg | SUBCUTANEOUS | 0 refills | Status: DC
  Filled 2021-03-12: qty 2, 28d supply, fill #0

## 2021-03-12 NOTE — Telephone Encounter (Signed)
Pt last seen by Dr. Wallace.  

## 2021-03-12 NOTE — Progress Notes (Signed)
Chief Complaint:   OBESITY Raymond Jordan is here to discuss his progress with his obesity treatment plan along with follow-up of his obesity related diagnoses.   Today's visit was #: 14 Starting weight: 292 lbs Starting date: 01/18/2020 Today's weight: 280 lbs Today's date: 03/11/2021 Weight change since last visit: 12 lbs Total lbs lost to date: 12 lbs Body mass index is 39.05 kg/m.  Total weight loss percentage to date: -4.11%  Current Meal Plan: the Category 3 Plan for 50% of the time.  Current Exercise Plan: Walking for 30+ minutes 3-4 times per week. Current Anti-Obesity Medications: Mounjaro 2.5 mg subcutaneously weekly. Side effects: None.  Interim History:  Raymond Jordan is doing well.  He is tolerating his medication.  He reports being happy with his progress.  Denies polyphagia.  Assessment/Plan:   1. Impaired fasting glucose, with polphagia Controlled. Current treatment: Mounjaro 2.5 mg subcutaneously weekly. He will continue to focus on protein-rich, low simple carbohydrate foods. We reviewed the importance of hydration, regular exercise for stress reduction, and restorative sleep.  Plan:  Increase Mounjaro to 5 mg subcutaneously weekly.  2. Essential hypertension At goal. Medications: Cozaar 25 mg daily, metoprolol 50 mg twice daily, chlorthalidone 25 mg daily.   Plan:  Continue medications.  Avoid buying foods that are: processed, frozen, or prepackaged to avoid excess salt. We will watch for signs of hypotension as he continues lifestyle modifications.  BP Readings from Last 3 Encounters:  03/11/21 127/77  02/12/21 127/76  01/10/21 112/86   Lab Results  Component Value Date   CREATININE 1.25 11/06/2020   3. Mixed hyperlipidemia Course: Controlled. Lipid-lowering medications: Zetia 10 mg daily, pravastatin 80 mg daily.   Plan:  Continue medications.  Dietary changes: Increase soluble fiber, decrease simple carbohydrates, decrease saturated fat. Exercise changes:  Moderate to vigorous-intensity aerobic activity 150 minutes per week or as tolerated. We will continue to monitor along with PCP/specialists as it pertains to his weight loss journey.  Lab Results  Component Value Date   CHOL 125 11/06/2020   HDL 46 11/06/2020   LDLCALC 64 11/06/2020   TRIG 71 11/06/2020   CHOLHDL 2.7 11/06/2020   Lab Results  Component Value Date   ALT 13 11/06/2020   AST 16 11/06/2020   ALKPHOS 68 11/06/2020   BILITOT 0.2 11/06/2020   4. OSA on CPAP OSA is a cause of systemic hypertension and is associated with an increased incidence of stroke, heart failure, atrial fibrillation, and coronary heart disease. Severe OSA increases all-cause mortality and cardiovascular mortality.   Goal: Treatment of OSA via CPAP compliance and weight loss. Plasma ghrelin levels (appetite or "hunger hormone") are significantly higher in OSA patients than in BMI-matched controls, but decrease to levels similar to those of obese patients without OSA after CPAP treatment.  Weight loss improves OSA by several mechanisms, including reduction in fatty tissue in the throat (i.e. parapharyngeal fat) and the tongue. Loss of abdominal fat increases mediastinal traction on the upper airway making it less likely to collapse during sleep. Studies have also shown that compliance with CPAP treatment improves leptin (hunger inhibitory hormone) imbalance.  5. At risk for heart disease Due to Raymond Jordan current state of health and medical condition(s), he is at a higher risk for heart disease.  This puts the patient at much greater risk to subsequently develop cardiopulmonary conditions that can significantly affect patient's quality of life in a negative manner.    At least 8 minutes were spent on counseling Raymond Jordan  about these concerns today. Evidence-based interventions for health behavior change were utilized today including the discussion of self monitoring techniques, problem-solving barriers, and SMART  goal setting techniques.  Specifically, regarding patient's less desirable eating habits and patterns, we employed the technique of small changes when Raymond Jordan has not been able to fully commit to his prudent nutritional plan.  6. Obesity, current BMI 39.2  Course: Raymond Jordan is currently in the action stage of change. As such, his goal is to continue with weight loss efforts.   Nutrition goals: He has agreed to the Category 3 Plan.   Exercise goals:  As is.  Behavioral modification strategies: increasing lean protein intake, decreasing simple carbohydrates, increasing vegetables, increasing water intake, decreasing liquid calories, and emotional eating strategies.  Raymond Jordan has agreed to follow-up with our clinic in 4 weeks. He was informed of the importance of frequent follow-up visits to maximize his success with intensive lifestyle modifications for his multiple health conditions.   Objective:   Blood pressure 127/77, pulse 60, temperature (!) 97.5 F (36.4 C), temperature source Oral, height 5\' 11"  (1.803 m), weight 280 lb (127 kg), SpO2 98 %. Body mass index is 39.05 kg/m.  General: Cooperative, alert, well developed, in no acute distress. HEENT: Conjunctivae and lids unremarkable. Cardiovascular: Regular rhythm.  Lungs: Normal work of breathing. Neurologic: No focal deficits.   Lab Results  Component Value Date   CREATININE 1.25 11/06/2020   BUN 18 11/06/2020   NA 143 11/06/2020   K 3.6 11/06/2020   CL 105 11/06/2020   CO2 23 11/06/2020   Lab Results  Component Value Date   ALT 13 11/06/2020   AST 16 11/06/2020   ALKPHOS 68 11/06/2020   BILITOT 0.2 11/06/2020   Lab Results  Component Value Date   HGBA1C 6.1 (H) 11/06/2020   HGBA1C 6.3 06/03/2020   HGBA1C 6.2 (H) 01/18/2020   Lab Results  Component Value Date   INSULIN 27.9 (H) 01/18/2020   Lab Results  Component Value Date   TSH 2.040 01/18/2020   Lab Results  Component Value Date   CHOL 125 11/06/2020   HDL  46 11/06/2020   LDLCALC 64 11/06/2020   TRIG 71 11/06/2020   CHOLHDL 2.7 11/06/2020   Lab Results  Component Value Date   VD25OH 45.2 11/06/2020   VD25OH 22.1 (L) 01/18/2020   Lab Results  Component Value Date   WBC 5.4 11/06/2020   HGB 13.0 11/06/2020   HCT 40.3 11/06/2020   MCV 85 11/06/2020   PLT 174 11/06/2020   Attestation Statements:   Reviewed by clinician on day of visit: allergies, medications, problem list, medical history, surgical history, family history, social history, and previous encounter notes.  I, 01/06/2021, CMA, am acting as transcriptionist for Insurance claims handler, DO  I have reviewed the above documentation for accuracy and completeness, and I agree with the above. Helane Rima, DO

## 2021-03-12 NOTE — Telephone Encounter (Signed)
Last seen by Dr. Wallace. 

## 2021-03-26 NOTE — Telephone Encounter (Signed)
error 

## 2021-04-04 ENCOUNTER — Encounter (HOSPITAL_COMMUNITY): Payer: Self-pay

## 2021-04-04 ENCOUNTER — Emergency Department (HOSPITAL_COMMUNITY)
Admission: EM | Admit: 2021-04-04 | Discharge: 2021-04-04 | Disposition: A | Discharge status: Home / Self Care | Payer: 59 | Attending: Emergency Medicine | Admitting: Emergency Medicine

## 2021-04-04 ENCOUNTER — Other Ambulatory Visit: Payer: Self-pay

## 2021-04-04 DIAGNOSIS — Z87891 Personal history of nicotine dependence: Secondary | ICD-10-CM | POA: Diagnosis not present

## 2021-04-04 DIAGNOSIS — K625 Hemorrhage of anus and rectum: Secondary | ICD-10-CM | POA: Insufficient documentation

## 2021-04-04 DIAGNOSIS — Z7902 Long term (current) use of antithrombotics/antiplatelets: Secondary | ICD-10-CM | POA: Diagnosis not present

## 2021-04-04 DIAGNOSIS — Z7984 Long term (current) use of oral hypoglycemic drugs: Secondary | ICD-10-CM | POA: Insufficient documentation

## 2021-04-04 DIAGNOSIS — I119 Hypertensive heart disease without heart failure: Secondary | ICD-10-CM | POA: Insufficient documentation

## 2021-04-04 DIAGNOSIS — R000 Tachycardia, unspecified: Secondary | ICD-10-CM | POA: Diagnosis not present

## 2021-04-04 DIAGNOSIS — Z7982 Long term (current) use of aspirin: Secondary | ICD-10-CM | POA: Insufficient documentation

## 2021-04-04 DIAGNOSIS — Z7952 Long term (current) use of systemic steroids: Secondary | ICD-10-CM | POA: Diagnosis not present

## 2021-04-04 DIAGNOSIS — I2510 Atherosclerotic heart disease of native coronary artery without angina pectoris: Secondary | ICD-10-CM | POA: Diagnosis not present

## 2021-04-04 DIAGNOSIS — R197 Diarrhea, unspecified: Secondary | ICD-10-CM | POA: Insufficient documentation

## 2021-04-04 DIAGNOSIS — Z79899 Other long term (current) drug therapy: Secondary | ICD-10-CM | POA: Diagnosis not present

## 2021-04-04 DIAGNOSIS — E876 Hypokalemia: Secondary | ICD-10-CM | POA: Diagnosis not present

## 2021-04-04 LAB — CBC
HCT: 44.7 % (ref 39.0–52.0)
Hemoglobin: 14.6 g/dL (ref 13.0–17.0)
MCH: 28.5 pg (ref 26.0–34.0)
MCHC: 32.7 g/dL (ref 30.0–36.0)
MCV: 87.1 fL (ref 80.0–100.0)
Platelets: 221 10*3/uL (ref 150–400)
RBC: 5.13 MIL/uL (ref 4.22–5.81)
RDW: 14 % (ref 11.5–15.5)
WBC: 9.1 10*3/uL (ref 4.0–10.5)
nRBC: 0 % (ref 0.0–0.2)

## 2021-04-04 LAB — COMPREHENSIVE METABOLIC PANEL
ALT: 21 U/L (ref 0–44)
AST: 26 U/L (ref 15–41)
Albumin: 4.6 g/dL (ref 3.5–5.0)
Alkaline Phosphatase: 66 U/L (ref 38–126)
Anion gap: 8 (ref 5–15)
BUN: 24 mg/dL — ABNORMAL HIGH (ref 6–20)
CO2: 27 mmol/L (ref 22–32)
Calcium: 10.2 mg/dL (ref 8.9–10.3)
Chloride: 107 mmol/L (ref 98–111)
Creatinine, Ser: 1.59 mg/dL — ABNORMAL HIGH (ref 0.61–1.24)
GFR, Estimated: 50 mL/min — ABNORMAL LOW (ref >60)
Glucose, Bld: 96 mg/dL (ref 70–99)
Potassium: 3 mmol/L — ABNORMAL LOW (ref 3.5–5.1)
Sodium: 142 mmol/L (ref 135–145)
Total Bilirubin: 0.7 mg/dL (ref 0.3–1.2)
Total Protein: 9.5 g/dL — ABNORMAL HIGH (ref 6.5–8.1)

## 2021-04-04 LAB — POC OCCULT BLOOD, ED: Fecal Occult Bld: NEGATIVE

## 2021-04-04 LAB — TYPE AND SCREEN
ABO/RH(D): B POS
Antibody Screen: NEGATIVE

## 2021-04-04 MED ORDER — POTASSIUM CHLORIDE CRYS ER 20 MEQ PO TBCR
40.0000 meq | EXTENDED_RELEASE_TABLET | Freq: Once | ORAL | Status: AC
  Administered 2021-04-04: 40 meq via ORAL
  Filled 2021-04-04: qty 2

## 2021-04-04 MED ORDER — POTASSIUM CHLORIDE CRYS ER 20 MEQ PO TBCR: 20.0000 meq | EXTENDED_RELEASE_TABLET | Freq: Every day | ORAL | 0 refills | Status: DC

## 2021-04-04 MED ORDER — LACTATED RINGERS IV BOLUS
1000.0000 mL | Freq: Once | INTRAVENOUS | Status: AC
  Administered 2021-04-04: 1000 mL via INTRAVENOUS

## 2021-04-04 NOTE — ED Provider Notes (Signed)
[facility]-EMERGENCY DEPT Provider Note   CSN: [id number] Arrival date & time: 04/04/21  0048     History Chief Complaint  Patient presents with   Rectal Bleeding    Raymond Jordan. is a 60 y.o. male.  HPI 60 year old male presents with rectal bleeding.  He states that he has been having multiple episodes of diarrhea since around 6 PM.  Some stomach cramping that worsens just prior to diarrhea.  He did not look in the toilet until the most recent time prior to coming to the hospital and there was a lot of blood and some blood clot.  He did not notice much stool.  He did also have a stool in the ER waiting room but states there is no blood that time.  He is on baby aspirin and Plavix.  He feels a little fatigued compared to normal but denies lightheadedness, shortness of breath.  He states that he is been having some issues with bleeding from his rectum into his underwear for many months but has never had anything like this.  Some rectal discomfort for few months that he thinks might be a hemorrhoid.  Past Medical History:  Diagnosis Date   Allergic rhinitis    /asthma   Anxiety    Arthritis    "shoulders, back, knees" (09/16/2015)   Bulging of intervertebral disc between L4 and L5    CAD (coronary artery disease), native coronary artery 09/15/2015   s/p NSTEMI 09/2015 with cath showing 99% RCA s/p DES now on DAPT   Childhood asthma    Chronic back pain    Constipation    ED (erectile dysfunction)    Essential hypertension, benign 12/19/2012   GERD (gastroesophageal reflux disease)    after taking meds , not treated at present   GERD (gastroesophageal reflux disease)    Glaucoma    Health examination of defined subpopulation 12/19/2012   Heart attack (HCC)    Hyperlipidemia LDL goal <70    Joint pain    Lower extremity edema    Meningitis hospitalized 07/2009   rule out bells palsy ruled out menigitis   Obesity 06/26/2013   OSA on CPAP 12/19/2012    Osteoarthritis    OA knees, shoulders; DDD lumbar spine   Pure hypercholesterolemia 12/19/2012   Seasonal allergies    SOB (shortness of breath)     Patient Active Problem List   Diagnosis Date Noted   Glaucoma 09/24/2020   Allergic rhinitis due to pollen 09/24/2020   Osteoarthritis of knee 09/24/2020   Visceral obesity 03/20/2020   Vitamin D deficiency 03/20/2020   Prediabetes 03/20/2020   CAD (coronary artery disease), native coronary artery 12/13/2015   NSTEMI (non-ST elevated myocardial infarction) (HCC) 09/15/2015   Obesity 06/26/2013   Health examination of defined subpopulation 12/19/2012   Essential hypertension, benign 12/19/2012   Pure hypercholesterolemia 12/19/2012   OSA on CPAP 12/19/2012    Past Surgical History:  Procedure Laterality Date   ANKLE SURGERY  1985   bone spur removal ankle.   CARDIAC CATHETERIZATION N/A 09/16/2015   Procedure: Left Heart Cath and Coronary Angiography;  Surgeon: Kathleene Hazel, MD;  Location: [facility] INVASIVE CV LAB;  Service: Cardiovascular;  Laterality: N/A;   CARDIAC CATHETERIZATION N/A 09/16/2015   Procedure: Coronary Stent Intervention;  Surgeon: Kathleene Hazel, MD;  Location: [facility] INVASIVE CV LAB;  Service: Cardiovascular;  Laterality: N/A;   MYRINGOTOMY WITH TUBE PLACEMENT Left ~ 07/2009   NASAL SINUS SURGERY  ~  2007   "& fixed my septum"   SHOULDER OPEN ROTATOR CUFF REPAIR Right 10/2013   TRIGGER FINGER RELEASE Right 07/01/2017   Procedure: RELEASE TRIGGER FINGER/A-1 PULLEY RIGHT THUMB;  Surgeon: Betha Loa, MD;  Location: [facility];  Service: Orthopedics;  Laterality: Right;   VASECTOMY     WISDOM TOOTH EXTRACTION  ~ 1992       Family History  Problem Relation Age of Onset   Asthma Mother        sarcoidosis also   Diabetes Mother    Stroke Mother    Sarcoidosis Mother    Hypertension Mother    Depression Mother    Obesity Mother    Stroke Father    Cancer Father        R breast cancer    Heart disease Father        AMI/CAD with stenting   Arthritis Father    Heart attack Father    Hypertension Father     Social History   Tobacco Use   Smoking status: Former   Smokeless tobacco: Never   Tobacco comments:    "smoked 3 packs of cigaretes in my whole life"  Vaping Use   Vaping Use: Never used  Substance Use Topics   Alcohol use: Yes    Comment: 09/16/2015 "might have a couple drinks q couple months"   Drug use: No    Home Medications Prior to Admission medications   Medication Sig Start Date End Date Taking? Authorizing Provider  aspirin EC 81 MG tablet Take 81 mg by mouth daily.   Yes [provider]  B Complex-Biotin-FA (SUPER B-COMPLEX) TABS Take 1 tablet by mouth daily.   Yes [provider]  chlorthalidone (HYGROTON) 25 MG tablet Take 1 tablet (25 mg total) by mouth daily. 01/10/21  Yes Turner, Cornelious Bryant, MD  clopidogrel (PLAVIX) 75 MG tablet Take 1 tablet (75 mg total) by mouth daily. 01/10/21  Yes Turner, Cornelious Bryant, MD  diclofenac Sodium (VOLTAREN) 1 % GEL Apply 2 g topically daily as needed (for pain).   Yes [provider]  ezetimibe (ZETIA) 10 MG tablet Take 1 tablet (10 mg total) by mouth daily. 01/10/21  Yes Turner, Cornelious Bryant, MD  fluticasone (FLONASE) 50 MCG/ACT nasal spray Place into both nostrils as needed for allergies or rhinitis.   Yes [provider]  latanoprost (XALATAN) 0.005 % ophthalmic solution Place 1 drop into both eyes at bedtime.   Yes [provider]  losartan (COZAAR) 25 MG tablet Take 1 tablet (25 mg total) by mouth daily. 01/10/21  Yes Turner, Cornelious Bryant, MD  metoprolol tartrate (LOPRESSOR) 50 MG tablet Take 1 tablet (50 mg total) by mouth 2 (two) times daily. 01/10/21  Yes Turner, Cornelious Bryant, MD  nitroGLYCERIN (NITROSTAT) 0.4 MG SL tablet Place 1 tablet (0.4 mg total) under the tongue every 5 (five) minutes as needed for chest pain. 03/15/19  Yes Turner, Cornelious Bryant, MD  potassium chloride SA (KLOR-CON) 20 MEQ tablet  Take 1 tablet (20 mEq total) by mouth daily. 04/04/21  Yes Pricilla Loveless, MD  pravastatin (PRAVACHOL) 80 MG tablet Take 1 tablet (80 mg total) by mouth daily. 01/10/21  Yes Quintella Reichert, MD  timolol (TIMOPTIC-XR) 0.5 % ophthalmic gel-forming  09/23/20  Yes [provider]  tirzepatide [name]) 5 MG/0.5ML Pen Inject 5 mg into the skin once a week. Patient taking differently: Inject 5 mg into the skin once a week. Every Saturday 03/12/21  Yes Helane Rima, DO  Vitamin D, Ergocalciferol, (DRISDOL) 1.25 MG (50000 UNIT) CAPS capsule TAKE 1 CAPSULE BY MOUTH  EVERY 7 DAYS Patient taking differently: Take 50,000 Units by mouth every 7 (seven) days. Every Saturday 09/25/20  Yes Helane Rima, DO  cetirizine (ZYRTEC) 10 MG tablet Take 10 mg by mouth daily as needed for allergies.     [provider]  metFORMIN (GLUCOPHAGE) 500 MG tablet Take 1 tablet (500 mg total) by mouth daily with breakfast. Patient not taking: Reported on 04/04/2021 01/28/21   Helane Rima, DO    Allergies    Atorvastatin, Lisinopril, and Rosuvastatin  Review of Systems   Review of Systems  Respiratory:  Negative for shortness of breath.   Cardiovascular:  Negative for chest pain.  Gastrointestinal:  Positive for abdominal pain, blood in stool and rectal pain.  Neurological:  Negative for light-headedness.  All other systems reviewed and are negative.  Physical Exam Updated Vital Signs BP (!) 140/103   Pulse 100   Temp 98.7 F (37.1 C) (Oral)   Resp 18   Ht 5\' 11"  (1.803 m)   Wt 127 kg   SpO2 98%   BMI 39.05 kg/m   Physical Exam Vitals and nursing note reviewed. Exam conducted with a chaperone present.  Constitutional:      General: He is not in acute distress.    Appearance: He is well-developed. He is obese. He is not ill-appearing or diaphoretic.  HENT:     Head: Normocephalic and atraumatic.     Right Ear: External ear normal.     Left Ear: External ear normal.     Nose: Nose normal.   Eyes:     General:        Right eye: No discharge.        Left eye: No discharge.  Cardiovascular:     Rate and Rhythm: Regular rhythm. Tachycardia present.     Heart sounds: Normal heart sounds.  Pulmonary:     Effort: Pulmonary effort is normal.     Breath sounds: Normal breath sounds.  Abdominal:     Palpations: Abdomen is soft.     Tenderness: There is no abdominal tenderness.     Comments: No real pain/tenderness, but my pressing on his abdomen makes him feel like he needs to have a BM  Genitourinary:      Comments: No gross blood on DRE. No masses. Musculoskeletal:     Cervical back: Neck supple.  Skin:    General: Skin is warm and dry.  Neurological:     Mental Status: He is alert.  Psychiatric:        Mood and Affect: Mood is not anxious.    ED Results / Procedures / Treatments   Labs (all labs ordered are listed, but only abnormal results are displayed) Labs Reviewed  COMPREHENSIVE METABOLIC PANEL - Abnormal; Notable for the following components:      Result Value   Potassium 3.0 (*)    BUN 24 (*)    Creatinine, Ser 1.59 (*)    Total Protein 9.5 (*)    GFR, Estimated 50 (*)    All other components within normal limits  CBC  POC OCCULT BLOOD, ED  TYPE AND SCREEN  ABO/RH    EKG None  Radiology No results found.  Procedures Procedures   Medications Ordered in ED Medications  lactated ringers bolus 1,000 mL (0 mLs Intravenous Stopped 04/04/21 0345)  potassium chloride SA (KLOR-CON) CR tablet 40  mEq (40 mEq Oral Given 04/04/21 0345)    ED Course  I have reviewed the triage vital signs and the nursing notes.  Pertinent labs & imaging results that were available during my care of the patient were reviewed by me and considered in my medical decision making (see chart for details).    MDM Rules/Calculators/A&P                           Patient presents with diarrhea that at one point had blood in it but no other times.  He had a couple bowel  movements here with no blood.  He has a possible hemangioma on exam.  However he is well-appearing.  His vital signs have gotten a lot better and he is no longer tachycardic and he feels better with fluids.  Abdominal exam is benign.  At this point, I discussed options including observation versus going home with return precautions and close GI follow-up.  He prefers to go home which I think is reasonable, especially with no recurrent bleeding.  He has seen [facility] in the past and so will be referred there.  Will give short course of potassium.  Otherwise discharged with return precautions. Final Clinical Impression(s) / ED Diagnoses Final diagnoses:  Diarrhea, unspecified type  Hypokalemia    Rx / DC Orders ED Discharge Orders          Ordered    potassium chloride SA (KLOR-CON) 20 MEQ tablet  Daily        04/04/21 0456             Pricilla Loveless, MD 04/04/21 [phone]

## 2021-04-04 NOTE — ED Triage Notes (Signed)
Patient presents with rectal bleeding. He said his doctor thought it was a hemorrhoid. Today his stomach was hurting, he thought it was diarrhea that started at 1pm today. When he looked at it one hour ago, he saw "a lot of blood in the toilet and his underwear."

## 2021-04-04 NOTE — ED Notes (Signed)
Pt unable to sign for d/c because computer in room was in the middle of a lengthy update. D/C papers given, all questions answered. Pt and visitor expressed understanding of instructions.

## 2021-04-04 NOTE — Discharge Instructions (Addendum)
If you develop worsening, continued, or recurrent abdominal pain, blood in your stool, uncontrolled vomiting, fever, chest or back pain, or any other new/concerning symptoms then return to the ER for evaluation.

## 2021-04-09 ENCOUNTER — Ambulatory Visit (INDEPENDENT_AMBULATORY_CARE_PROVIDER_SITE_OTHER): Payer: 59 | Admitting: Adult Health

## 2021-04-09 ENCOUNTER — Other Ambulatory Visit: Payer: Self-pay

## 2021-04-09 ENCOUNTER — Encounter (INDEPENDENT_AMBULATORY_CARE_PROVIDER_SITE_OTHER): Payer: Self-pay | Admitting: Adult Health

## 2021-04-09 ENCOUNTER — Other Ambulatory Visit (HOSPITAL_COMMUNITY): Payer: Self-pay

## 2021-04-09 VITALS — BP 102/68 | HR 75 | Temp 97.9°F | Ht 71.0 in | Wt 264.0 lb

## 2021-04-09 DIAGNOSIS — I10 Essential (primary) hypertension: Secondary | ICD-10-CM | POA: Diagnosis not present

## 2021-04-09 DIAGNOSIS — R7301 Impaired fasting glucose: Secondary | ICD-10-CM | POA: Diagnosis not present

## 2021-04-09 DIAGNOSIS — E782 Mixed hyperlipidemia: Secondary | ICD-10-CM | POA: Diagnosis not present

## 2021-04-09 DIAGNOSIS — E876 Hypokalemia: Secondary | ICD-10-CM

## 2021-04-09 DIAGNOSIS — E559 Vitamin D deficiency, unspecified: Secondary | ICD-10-CM | POA: Diagnosis not present

## 2021-04-09 DIAGNOSIS — Z6841 Body Mass Index (BMI) 40.0 and over, adult: Secondary | ICD-10-CM

## 2021-04-09 DIAGNOSIS — Z9189 Other specified personal risk factors, not elsewhere classified: Secondary | ICD-10-CM

## 2021-04-09 DIAGNOSIS — E7849 Other hyperlipidemia: Secondary | ICD-10-CM | POA: Insufficient documentation

## 2021-04-09 MED ORDER — TIRZEPATIDE 5 MG/0.5ML ~~LOC~~ SOAJ
5.0000 mg | SUBCUTANEOUS | 0 refills | Status: DC
  Filled 2021-04-09: qty 2, 28d supply, fill #0

## 2021-04-09 NOTE — Progress Notes (Signed)
Chief Complaint:   OBESITY Raymond Jordan is here to discuss his progress with his obesity treatment plan along with follow-up of his obesity related diagnoses. Raymond Jordan is on the Category 3 Plan and states he is following his eating plan approximately 75% of the time. Raymond Jordan states he is not exercising regularly.  Today's visit was #: 15 Starting weight: 292 lbs Starting date: 01/18/2020 Today's weight: 264 lbs Today's date: 04/09/2021 Total lbs lost to date: 28 lbs Total lbs lost since last in-office visit: 16 lbs  Interim History: On 02/12/2021, Raymond Jordan was started on Mounjaro 2.5 mg.  He has titrated up to 5 mg - has had 4 doses at this strength.  He denies mass in neck, dysphagia, dyspepsia, or persistent hoarseness. He is able to eat all daily prescribed food on Mounjaro 5mg . He recently recovered from food poisoning - reviewed 04/04/21 ED encounter with patient.  Subjective:   1. Impaired fasting glucose, with polphagia On 02/12/2021, started on Mounjaro 2.5 mg, has titrated up to 5 mg.  He denies mass in neck, dysphagia, dyspepsia, or persistent hoarseness. He is able to eat all daily prescribed food on Mounjaro 5mg .  2. Essential hypertension Currently on: Cozaar 25 mg daily, metoprolol 50 mg twice daily, chlorthalidone 25 mg daily. BP/HR well controlled at office visit. He denies cardiac symptoms at present. He denies dizziness with position change or increased fatigue.  3. Hypokalemia On 04/04/2021, CMP - potassium 3.0- acute diarrhea at time of lab draw. He was given oral potassium replacement- he has completed course.   He denies cardiac symptoms at present.  4. Mixed hyperlipidemia He is on Pravachol 80 mg daiy - denies myalgias. Last lipid panel - all levels at goal.  5. Vitamin D deficiency He is currently taking prescription ergocalciferol 50,000 IU each week. He denies nausea, vomiting or muscle weakness.  6. At risk for constipation Raymond Jordan is at increased risk for  constipation due to GIP/GLP-1 for polyphagia.  Assessment/Plan:   1. Impaired fasting glucose, with polphagia Check labs today. Refill Mounjaro 5 mg subcutaneously once weekly, as per below.  - Hemoglobin A1c - Insulin, random - Refill tirzepatide (MOUNJARO) 5 MG/0.5ML Pen; Inject 5 mg into the skin once a week.  Dispense: 2 mL; Refill: 0  2. Essential hypertension Check labs today.  - Comprehensive metabolic panel  3. Hypokalemia Will check labs today- CMP  4. Mixed hyperlipidemia Check labs today.  - Lipid panel  5. Vitamin D deficiency Check vitamin D level today.  - VITAMIN D 25 Hydroxy (Vit-D Deficiency, Fractures)  6. At risk for constipation Bert was given approximately 15 minutes of counseling today regarding prevention of constipation. He was encouraged to increase water and fiber intake.    7. Obesity, current BMI 36.9  Raymond Jordan is currently in the action stage of change. As such, his goal is to continue with weight loss efforts. He has agreed to the Category 3 Plan.   Exercise goals:  Banded exercises.  Behavioral modification strategies: increasing lean protein intake, decreasing simple carbohydrates, meal planning and cooking strategies, keeping healthy foods in the home, avoiding temptations, and planning for success.  Raymond Jordan has agreed to follow-up with our clinic in 4-5 weeks. He was informed of the importance of frequent follow-up visits to maximize his success with intensive lifestyle modifications for his multiple health conditions.   Raymond Jordan was informed we would discuss his lab results at his next visit unless there is a critical issue that needs to be  addressed sooner. Raymond Jordan agreed to keep his next visit at the agreed upon time to discuss these results.  Objective:   Blood pressure 102/68, pulse 75, temperature 97.9 F (36.6 C), height 5\' 11"  (1.803 m), weight 264 lb (119.7 kg), SpO2 97 %. Body mass index is 36.82 kg/m.  General: Cooperative,  alert, well developed, in no acute distress. HEENT: Conjunctivae and lids unremarkable. Cardiovascular: Regular rhythm.  Lungs: Normal work of breathing. Neurologic: No focal deficits.   Lab Results  Component Value Date   CREATININE 1.59 (H) 04/04/2021   BUN 24 (H) 04/04/2021   NA 142 04/04/2021   K 3.0 (L) 04/04/2021   CL 107 04/04/2021   CO2 27 04/04/2021   Lab Results  Component Value Date   ALT 21 04/04/2021   AST 26 04/04/2021   ALKPHOS 66 04/04/2021   BILITOT 0.7 04/04/2021   Lab Results  Component Value Date   HGBA1C 6.1 (H) 11/06/2020   HGBA1C 6.3 06/03/2020   HGBA1C 6.2 (H) 01/18/2020   Lab Results  Component Value Date   INSULIN 27.9 (H) 01/18/2020   Lab Results  Component Value Date   TSH 2.040 01/18/2020   Lab Results  Component Value Date   CHOL 125 11/06/2020   HDL 46 11/06/2020   LDLCALC 64 11/06/2020   TRIG 71 11/06/2020   CHOLHDL 2.7 11/06/2020   Lab Results  Component Value Date   VD25OH 45.2 11/06/2020   VD25OH 22.1 (L) 01/18/2020   Lab Results  Component Value Date   WBC 9.1 04/04/2021   HGB 14.6 04/04/2021   HCT 44.7 04/04/2021   MCV 87.1 04/04/2021   PLT 221 04/04/2021   Attestation Statements:   Reviewed by clinician on day of visit: allergies, medications, problem list, medical history, surgical history, family history, social history, and previous encounter notes.  I, 04/06/2021, CMA, am acting as Insurance claims handler for Energy manager, NP.  I have reviewed the above documentation for accuracy and completeness, and I agree with the above. - Varie Machamer d. Raeven Pint, NP-C

## 2021-04-10 LAB — INSULIN, RANDOM: INSULIN: 14 u[IU]/mL (ref 2.6–24.9)

## 2021-04-10 LAB — LIPID PANEL
Chol/HDL Ratio: 3 ratio (ref 0.0–5.0)
Cholesterol, Total: 115 mg/dL (ref 100–199)
HDL: 38 mg/dL — ABNORMAL LOW (ref >39)
LDL Chol Calc (NIH): 63 mg/dL (ref 0–99)
Triglycerides: 67 mg/dL (ref 0–149)
VLDL Cholesterol Cal: 14 mg/dL (ref 5–40)

## 2021-04-10 LAB — HEMOGLOBIN A1C
Est. average glucose Bld gHb Est-mCnc: 123 mg/dL
Hgb A1c MFr Bld: 5.9 % — ABNORMAL HIGH (ref 4.8–5.6)

## 2021-04-10 LAB — VITAMIN D 25 HYDROXY (VIT D DEFICIENCY, FRACTURES): Vit D, 25-Hydroxy: 60.5 ng/mL (ref 30.0–100.0)

## 2021-05-07 ENCOUNTER — Other Ambulatory Visit: Payer: Self-pay

## 2021-05-07 ENCOUNTER — Telehealth: Payer: Self-pay | Admitting: *Deleted

## 2021-05-07 ENCOUNTER — Encounter (INDEPENDENT_AMBULATORY_CARE_PROVIDER_SITE_OTHER): Payer: Self-pay | Admitting: Adult Health

## 2021-05-07 ENCOUNTER — Ambulatory Visit (INDEPENDENT_AMBULATORY_CARE_PROVIDER_SITE_OTHER): Payer: 59 | Admitting: Adult Health

## 2021-05-07 VITALS — BP 116/72 | HR 83 | Temp 98.8°F | Ht 71.0 in | Wt 260.0 lb

## 2021-05-07 DIAGNOSIS — R7301 Impaired fasting glucose: Secondary | ICD-10-CM

## 2021-05-07 DIAGNOSIS — E559 Vitamin D deficiency, unspecified: Secondary | ICD-10-CM

## 2021-05-07 DIAGNOSIS — E7849 Other hyperlipidemia: Secondary | ICD-10-CM

## 2021-05-07 DIAGNOSIS — E876 Hypokalemia: Secondary | ICD-10-CM

## 2021-05-07 DIAGNOSIS — Z9189 Other specified personal risk factors, not elsewhere classified: Secondary | ICD-10-CM | POA: Diagnosis not present

## 2021-05-07 DIAGNOSIS — Z6841 Body Mass Index (BMI) 40.0 and over, adult: Secondary | ICD-10-CM

## 2021-05-07 MED ORDER — VITAMIN D (ERGOCALCIFEROL) 1.25 MG (50000 UNIT) PO CAPS: ORAL_CAPSULE | ORAL | 0 refills | Status: DC

## 2021-05-07 MED ORDER — TIRZEPATIDE 5 MG/0.5ML ~~LOC~~ SOAJ: 5.0000 mg | SUBCUTANEOUS | 0 refills | Status: DC

## 2021-05-07 NOTE — Telephone Encounter (Signed)
   Pre-operative Risk Assessment    Patient Name: Raymond Jordan.  DOB: 1960-08-27 MRN: [id number]      Request for Surgical Clearance   Procedure:   COLONOSCOPY/ENDOSCOPY  Date of Surgery: Clearance 07/01/21                                 Surgeon:  DR. Levora Angel Surgeon's Group or Practice Name:  [facility] Phone number:  [phone] Fax number:  [phone]   Type of Clearance Requested: - Medical  - Pharmacy:  Hold Clopidogrel (Plavix) x 5 DAYS PRIOR   Type of Anesthesia:   Not Indicated   Additional requests/questions: REQUESTING OFFICE STATES THEY SENT FAX BACK ON 04/11/21, THOUGH OUR OFFICE DID NOT RECEIVE A FAX FROM [facility] 04/11/21  Signed, Danielle Rankin   05/07/2021, 12:12 PM

## 2021-05-07 NOTE — Telephone Encounter (Signed)
Dr. Mayford Knife Pt has a history of CAD with DES to RCA in 2017, nonischemic stress test in 01/2020. He remains on ASA and plavix. Can he hold plavix x 5 days for colonoscopy?

## 2021-05-08 ENCOUNTER — Other Ambulatory Visit (HOSPITAL_COMMUNITY): Payer: Self-pay

## 2021-05-08 ENCOUNTER — Other Ambulatory Visit (INDEPENDENT_AMBULATORY_CARE_PROVIDER_SITE_OTHER): Payer: Self-pay

## 2021-05-08 ENCOUNTER — Encounter (INDEPENDENT_AMBULATORY_CARE_PROVIDER_SITE_OTHER): Payer: Self-pay

## 2021-05-08 ENCOUNTER — Telehealth (INDEPENDENT_AMBULATORY_CARE_PROVIDER_SITE_OTHER): Payer: Self-pay

## 2021-05-08 ENCOUNTER — Encounter (INDEPENDENT_AMBULATORY_CARE_PROVIDER_SITE_OTHER): Payer: Self-pay | Admitting: Adult Health

## 2021-05-08 DIAGNOSIS — R7301 Impaired fasting glucose: Secondary | ICD-10-CM

## 2021-05-08 MED ORDER — TIRZEPATIDE 5 MG/0.5ML ~~LOC~~ SOAJ
5.0000 mg | SUBCUTANEOUS | 0 refills | Status: DC
  Filled 2021-05-08: qty 2, 28d supply, fill #0

## 2021-05-08 NOTE — Telephone Encounter (Signed)
Pt called in and stated that he would like his [facility] sent to [facility].Please advise

## 2021-05-08 NOTE — Telephone Encounter (Signed)
Rx sent to [facility] at patients request. [facility] has been contacted and instructed to cancel prescription for [facility] sent yesterday 05/07/21.

## 2021-05-08 NOTE — Progress Notes (Signed)
Chief Complaint:   OBESITY Raymond Jordan is here to discuss his progress with his obesity treatment plan along with follow-up of his obesity related diagnoses. Raymond Jordan is on the Category 3 Plan and states he is following his eating plan approximately 70% of the time. Raymond Jordan states he is walking for 50-60 minutes 2 times per week.  Today's visit was #: 16 Starting weight: 292 lbs Starting date: 01/18/2020 Today's weight: 260 lbs Today's date: 05/07/2021 Total lbs lost to date: 32 lbs Total lbs lost since last in-office visit: 4 lbs  Interim History: Raymond Jordan has experienced profound diarrhea with Mounjaro 5 mg.   He was seen in the ED on 04/04/2021 for diarrhea/hematochezia. Labs revealed a potassium of 3.0. He has followed with [facility] - has a EGD/colonoscopy scheduled for next month. He denies hematochezia at present.  Subjective:   1. Impaired fasting glucose, with polphagia Discussed labs with patient today.  On 04/09/2021, A1c 5.9, insulin level 14. He has experienced diarrhea and hematochezia in past, now loose stools. He denies mass in neck, dysphagia, dyspepsia, or persistent hoarseness, He would like to continue Mounjaro at current dose of 5mg .  2. Vitamin D deficiency Discussed labs with patient today.  On 04/09/2021, vitamin D level - 60.5 - stable. He is currently taking prescription ergocalciferol 50,000 IU each week. He denies nausea, vomiting or muscle weakness.  3. Hypokalemia Discussed labs with patient today.  On 04/04/2021, ED labs - potassium 3.0 - treated with oral potassium 20 mEq - 5 daily doses. He denies acute cardiac symptoms.  4. Other hyperlipidemia Discussed labs with patient today.  On 04/09/2021, lipid panel - stable. He is on pravastatin 80 mg daily - denies myalgias.  5. At risk for nausea Raymond Jordan is at risk for nausea due to taking Mounjaro for IFG.  Assessment/Plan:   1. Impaired fasting glucose, with polphagia Refill Mounjaro 5 mg once  weekly.  - Refill tirzepatide (MOUNJARO) 5 MG/0.5ML Pen; Inject 5 mg into the skin once a week.  Dispense: 2 mL; Refill: 0  2. Vitamin D deficiency Refill ergocalciferol 50,000 IU once every 14 days.  - Refill Vitamin D, Ergocalciferol, (DRISDOL) 1.25 MG (50000 UNIT) CAPS capsule; One Cap every 14 days  Dispense: 12 capsule; Refill: 0  3. Hypokalemia Check CMP - contact patient with results.  4. Other hyperlipidemia Continue statin therapy.  5. At risk for nausea Raymond Jordan. was given approximately 15 minutes of nausea prevention counseling today. Raymond Jordan is at risk for nausea due to his new or current medication. He was encouraged to titrate his medication slowly, make sure to stay hydrated, eat smaller portions throughout the day, and avoid high fat meals.   6. Obesity, current BMI 36.3  Raymond Jordan is currently in the action stage of change. As such, his goal is to continue with weight loss efforts. He has agreed to the Category 3 Plan.   Exercise goals:  As is.  Behavioral modification strategies: increasing lean protein intake, decreasing simple carbohydrates, meal planning and cooking strategies, keeping healthy foods in the home, and planning for success.  Joon has agreed to follow-up with our clinic in 3 weeks. He was informed of the importance of frequent follow-up visits to maximize his success with intensive lifestyle modifications for his multiple health conditions.   Objective:   Blood pressure 116/72, pulse 83, temperature 98.8 F (37.1 C), height 5\' 11"  (1.803 m), weight 260 lb (117.9 kg), SpO2 99 %. Body mass index is  36.26 kg/m.  General: Cooperative, alert, well developed, in no acute distress. HEENT: Conjunctivae and lids unremarkable. Cardiovascular: Regular rhythm.  Lungs: Normal work of breathing. Neurologic: No focal deficits.   Lab Results  Component Value Date   CREATININE 1.59 (H) 04/04/2021   BUN 24 (H) 04/04/2021   NA 142 04/04/2021   K  3.0 (L) 04/04/2021   CL 107 04/04/2021   CO2 27 04/04/2021   Lab Results  Component Value Date   ALT 21 04/04/2021   AST 26 04/04/2021   ALKPHOS 66 04/04/2021   BILITOT 0.7 04/04/2021   Lab Results  Component Value Date   HGBA1C 5.9 (H) 04/09/2021   HGBA1C 6.1 (H) 11/06/2020   HGBA1C 6.3 06/03/2020   HGBA1C 6.2 (H) 01/18/2020   Lab Results  Component Value Date   INSULIN 14.0 04/09/2021   INSULIN 27.9 (H) 01/18/2020   Lab Results  Component Value Date   TSH 2.040 01/18/2020   Lab Results  Component Value Date   CHOL 115 04/09/2021   HDL 38 (L) 04/09/2021   LDLCALC 63 04/09/2021   TRIG 67 04/09/2021   CHOLHDL 3.0 04/09/2021   Lab Results  Component Value Date   VD25OH 60.5 04/09/2021   VD25OH 45.2 11/06/2020   VD25OH 22.1 (L) 01/18/2020   Lab Results  Component Value Date   WBC 9.1 04/04/2021   HGB 14.6 04/04/2021   HCT 44.7 04/04/2021   MCV 87.1 04/04/2021   PLT 221 04/04/2021   Attestation Statements:   Reviewed by clinician on day of visit: allergies, medications, problem list, medical history, surgical history, family history, social history, and previous encounter notes.  I, Insurance claims handler, CMA, am acting as Energy manager for William Hamburger, NP.  I have reviewed the above documentation for accuracy and completeness, and I agree with the above. -  Caitlynne Harbeck d. Latavion Halls, NP-C

## 2021-05-09 LAB — COMPREHENSIVE METABOLIC PANEL
ALT: 15 IU/L (ref 0–44)
AST: 16 IU/L (ref 0–40)
Albumin/Globulin Ratio: 1.2 (ref 1.2–2.2)
Albumin: 4.2 g/dL (ref 3.8–4.9)
Alkaline Phosphatase: 67 IU/L (ref 44–121)
BUN/Creatinine Ratio: 13 (ref 9–20)
BUN: 14 mg/dL (ref 6–24)
Bilirubin Total: 0.4 mg/dL (ref 0.0–1.2)
CO2: 25 mmol/L (ref 20–29)
Calcium: 9.1 mg/dL (ref 8.7–10.2)
Chloride: 105 mmol/L (ref 96–106)
Creatinine, Ser: 1.08 mg/dL (ref 0.76–1.27)
Globulin, Total: 3.6 g/dL (ref 1.5–4.5)
Glucose: 80 mg/dL (ref 70–99)
Potassium: 4.4 mmol/L (ref 3.5–5.2)
Sodium: 143 mmol/L (ref 134–144)
Total Protein: 7.8 g/dL (ref 6.0–8.5)
eGFR: 80 mL/min/{1.73_m2} (ref >59)

## 2021-05-12 ENCOUNTER — Encounter (INDEPENDENT_AMBULATORY_CARE_PROVIDER_SITE_OTHER): Payer: Self-pay | Admitting: Adult Health

## 2021-05-12 ENCOUNTER — Encounter (INDEPENDENT_AMBULATORY_CARE_PROVIDER_SITE_OTHER): Payer: Self-pay

## 2021-05-12 NOTE — Telephone Encounter (Signed)
Left a message for the patient to call back and speak to the on-call preop APP of the day 

## 2021-05-13 NOTE — Telephone Encounter (Signed)
   Name: Raymond Jordan.  DOB: 08-11-1960  MRN: [id number]   Primary Cardiologist: Armanda Magic, MD  Chart reviewed as part of pre-operative protocol coverage. Patient was contacted 05/13/2021 in reference to pre-operative risk assessment for pending surgery as outlined below.  Raymond Jordan. was last seen on 01/10/2021 by Dr. Mayford Knife.  Since that day, Raymond Jordan. has done without chest pain or worsening dyspnea.  Therefore, based on ACC/AHA guidelines, the patient would be at acceptable risk for the planned procedure without further cardiovascular testing.   Patient may hold Plavix for 5 days prior to the procedure and restart as soon as possible afterward at the discretion of his GI physician who does the procedure.  Given his cardiac history, we would prefer him to continue on aspirin through the procedure, however if absolutely need to come off the aspirin, he may also hold aspirin for 5 days prior to the procedure as well and restart afterward.  The patient was advised that if he develops new symptoms prior to surgery to contact our office to arrange for a follow-up visit, and he verbalized understanding.  I will route this recommendation to the requesting party via Epic fax function and remove from pre-op pool. Please call with questions.  [place], [place] 05/13/2021, 11:34 AM

## 2021-06-02 ENCOUNTER — Other Ambulatory Visit: Payer: Self-pay

## 2021-06-02 ENCOUNTER — Ambulatory Visit (INDEPENDENT_AMBULATORY_CARE_PROVIDER_SITE_OTHER): Payer: 59 | Admitting: Family Medicine

## 2021-06-02 ENCOUNTER — Encounter (INDEPENDENT_AMBULATORY_CARE_PROVIDER_SITE_OTHER): Payer: Self-pay | Admitting: Family Medicine

## 2021-06-02 ENCOUNTER — Other Ambulatory Visit (HOSPITAL_COMMUNITY): Payer: Self-pay

## 2021-06-02 ENCOUNTER — Ambulatory Visit (INDEPENDENT_AMBULATORY_CARE_PROVIDER_SITE_OTHER): Payer: 59 | Admitting: Adult Health

## 2021-06-02 VITALS — BP 118/76 | HR 66 | Temp 98.6°F | Ht 71.0 in | Wt 261.0 lb

## 2021-06-02 DIAGNOSIS — Z6841 Body Mass Index (BMI) 40.0 and over, adult: Secondary | ICD-10-CM | POA: Diagnosis not present

## 2021-06-02 DIAGNOSIS — Z9189 Other specified personal risk factors, not elsewhere classified: Secondary | ICD-10-CM | POA: Diagnosis not present

## 2021-06-02 DIAGNOSIS — R7303 Prediabetes: Secondary | ICD-10-CM | POA: Diagnosis not present

## 2021-06-02 DIAGNOSIS — E66813 Obesity, class 3: Secondary | ICD-10-CM

## 2021-06-02 MED ORDER — TIRZEPATIDE 5 MG/0.5ML ~~LOC~~ SOAJ
5.0000 mg | SUBCUTANEOUS | 0 refills | Status: DC
  Filled 2021-06-02: qty 2, 28d supply, fill #0

## 2021-06-02 NOTE — Progress Notes (Signed)
Chief Complaint:   OBESITY Raymond Jordan is here to discuss his progress with his obesity treatment plan along with follow-up of his obesity related diagnoses. Raymond Jordan is on the Category 3 Plan and states he is following his eating plan approximately 50% of the time. Maximum states he is doing some walking.   Today's visit was #: 17 Starting weight: 292 lbs Starting date: 01/18/2020 Today's weight: 261 lbs Today's date: 06/02/2021 Total lbs lost to date: 31 Total lbs lost since last in-office visit: 0  Interim History: Raymond Jordan did well with minimizing holiday weight gain. He was mindful of his portions but he notes simple carbohydrates were higher than ideal.  Subjective:   1. Pre-diabetes Raymond Jordan has been on Mounjaro 5 mg for approximately 2 months. He feels the hunger control on this dose is not as good as when he was on the 2.5 mg. He also notes increased diarrhea for a few weeks.  2. At risk for diabetes mellitus Raymond Jordan is at higher than average risk for developing diabetes due to obesity.   Assessment/Plan:   1. Pre-diabetes We will refill Mounjaro 5 mg q weekly for 1 month. Raymond Jordan was advised that the decrease in his appetite control was likely due to the deviations in his diet over the holidays, especially with increased simple carbohydrates and decreased protein. He will be extra diligent about following his Category 3 plan and will follow up in 3 weeks.  2. At risk for diabetes mellitus Raymond Jordan was given approximately 15 minutes of diabetes education and counseling today. We discussed intensive lifestyle modifications today with an emphasis on weight loss as well as increasing exercise and decreasing simple carbohydrates in his diet. We also reviewed medication options with an emphasis on risk versus benefit of those discussed.   Repetitive spaced learning was employed today to elicit superior memory formation and behavioral change.  3. Obesity BMI today is 24 Raymond Jordan is  currently in the action stage of change. As such, his goal is to continue with weight loss efforts. He has agreed to the Category 3 Plan.   Exercise goals: As is.  Behavioral modification strategies: holiday eating strategies .  Raymond Jordan has agreed to follow-up with our clinic in 3 weeks. He was informed of the importance of frequent follow-up visits to maximize his success with intensive lifestyle modifications for his multiple health conditions.   Objective:   Blood pressure 118/76, pulse 66, temperature 98.6 F (37 C), height 5\' 11"  (1.803 m), weight 261 lb (118.4 kg), SpO2 98 %. Body mass index is 36.4 kg/m.  General: Cooperative, alert, well developed, in no acute distress. HEENT: Conjunctivae and lids unremarkable. Cardiovascular: Regular rhythm.  Lungs: Normal work of breathing. Neurologic: No focal deficits.   Lab Results  Component Value Date   CREATININE 1.08 05/08/2021   BUN 14 05/08/2021   NA 143 05/08/2021   K 4.4 05/08/2021   CL 105 05/08/2021   CO2 25 05/08/2021   Lab Results  Component Value Date   ALT 15 05/08/2021   AST 16 05/08/2021   ALKPHOS 67 05/08/2021   BILITOT 0.4 05/08/2021   Lab Results  Component Value Date   HGBA1C 5.9 (H) 04/09/2021   HGBA1C 6.1 (H) 11/06/2020   HGBA1C 6.3 06/03/2020   HGBA1C 6.2 (H) 01/18/2020   Lab Results  Component Value Date   INSULIN 14.0 04/09/2021   INSULIN 27.9 (H) 01/18/2020   Lab Results  Component Value Date   TSH 2.040 01/18/2020  Lab Results  Component Value Date   CHOL 115 04/09/2021   HDL 38 (L) 04/09/2021   LDLCALC 63 04/09/2021   TRIG 67 04/09/2021   CHOLHDL 3.0 04/09/2021   Lab Results  Component Value Date   VD25OH 60.5 04/09/2021   VD25OH 45.2 11/06/2020   VD25OH 22.1 (L) 01/18/2020   Lab Results  Component Value Date   WBC 9.1 04/04/2021   HGB 14.6 04/04/2021   HCT 44.7 04/04/2021   MCV 87.1 04/04/2021   PLT 221 04/04/2021   No results found for: IRON, TIBC,  FERRITIN  Attestation Statements:   Reviewed by clinician on day of visit: allergies, medications, problem list, medical history, surgical history, family history, social history, and previous encounter notes.   I, Burt Knack, am acting as transcriptionist for Quillian Quince, MD.  I have reviewed the above documentation for accuracy and completeness, and I agree with the above. -  Quillian Quince, MD

## 2021-06-19 ENCOUNTER — Other Ambulatory Visit (INDEPENDENT_AMBULATORY_CARE_PROVIDER_SITE_OTHER): Payer: Self-pay | Admitting: Family Medicine

## 2021-06-19 DIAGNOSIS — E559 Vitamin D deficiency, unspecified: Secondary | ICD-10-CM

## 2021-06-19 NOTE — Telephone Encounter (Signed)
LAST APPOINTMENT DATE: 06/02/21 NEXT APPOINTMENT DATE: 06/26/21   [id number] [place], [place] - [address] [place] [address] Phone: [phone] Fax: [phone]  [facility] [address] [place] [address] Phone: [phone] Fax: [phone]  [facility] ([facility] ) - [place], [place] - [address] [place] [address] Phone: [phone] Fax: [phone]  Patient is requesting a refill of the following medications: Requested Prescriptions   Pending Prescriptions Disp Refills   Vitamin D, Ergocalciferol, (DRISDOL) 1.25 MG (50000 UNIT) CAPS capsule [Pharmacy Med Name: Vitamin D (Ergocalciferol) 1.25 MG (50000 UT) Oral Capsule] 13 capsule 3    Sig: TAKE 1 CAPSULE BY MOUTH  EVERY 7 DAYS    Date last filled: 05/07/21 Previously prescribed by William Hamburger, NP  Lab Results  Component Value Date   HGBA1C 5.9 (H) 04/09/2021   HGBA1C 6.1 (H) 11/06/2020   HGBA1C 6.3 06/03/2020   Lab Results  Component Value Date   LDLCALC 63 04/09/2021   CREATININE 1.08 05/08/2021   Lab Results  Component Value Date   VD25OH 60.5 04/09/2021   VD25OH 45.2 11/06/2020   VD25OH 22.1 (L) 01/18/2020    BP Readings from Last 3 Encounters:  06/02/21 118/76  05/07/21 116/72  04/09/21 102/68

## 2021-06-19 NOTE — Telephone Encounter (Signed)
Pt last seen by Dr. Beasley.  

## 2021-06-23 ENCOUNTER — Ambulatory Visit (INDEPENDENT_AMBULATORY_CARE_PROVIDER_SITE_OTHER): Payer: 59 | Admitting: Family Medicine

## 2021-07-02 ENCOUNTER — Ambulatory Visit (INDEPENDENT_AMBULATORY_CARE_PROVIDER_SITE_OTHER): Payer: 59 | Admitting: Adult Health

## 2021-07-02 ENCOUNTER — Other Ambulatory Visit (HOSPITAL_COMMUNITY): Payer: Self-pay

## 2021-07-02 ENCOUNTER — Encounter (INDEPENDENT_AMBULATORY_CARE_PROVIDER_SITE_OTHER): Payer: Self-pay | Admitting: Adult Health

## 2021-07-02 ENCOUNTER — Other Ambulatory Visit: Payer: Self-pay

## 2021-07-02 VITALS — BP 116/75 | HR 64 | Temp 98.6°F | Ht 71.0 in | Wt 257.0 lb

## 2021-07-02 DIAGNOSIS — E559 Vitamin D deficiency, unspecified: Secondary | ICD-10-CM | POA: Diagnosis not present

## 2021-07-02 DIAGNOSIS — Z9189 Other specified personal risk factors, not elsewhere classified: Secondary | ICD-10-CM

## 2021-07-02 DIAGNOSIS — R7303 Prediabetes: Secondary | ICD-10-CM

## 2021-07-02 DIAGNOSIS — Z6841 Body Mass Index (BMI) 40.0 and over, adult: Secondary | ICD-10-CM

## 2021-07-02 MED ORDER — TIRZEPATIDE 5 MG/0.5ML ~~LOC~~ SOAJ
5.0000 mg | SUBCUTANEOUS | 0 refills | Status: DC
  Filled 2021-07-02: qty 2, 28d supply, fill #0

## 2021-07-02 NOTE — Progress Notes (Signed)
Chief Complaint:   OBESITY Raymond Jordan is here to discuss his progress with his obesity treatment plan along with follow-up of his obesity related diagnoses. Raymond Jordan is on the Category 3 Plan and states he is following his eating plan approximately 50% of the time. Raymond Jordan states he is walking for 30+ minutes 2 times per week.  Today's visit was #: 18 Starting weight: 292 lbs Starting date: 01/18/2020 Today's weight: 257 lbs Today's date: 07/02/2021 Total lbs lost to date: 35 lbs Total lbs lost since last in-office visit: 4 lbs  Interim History:  Raymond Jordan has been on Mounjaro 5 mg once weekly for 2 months - diarrhea has significantly decreased. On 07/01/2021 - EGD/colonoscopy - he held 06/28/2021 Mounjaro 5 mg, will resume saturday injection - 07/05/2021.  Subjective:   1. Pre-diabetes On 04/09/2021, A1c was 5.9. Raymond Jordan has been on Mounjaro 5 mg once weekly for 2 months - diarrhea has significantly decreased. He denies mass in neck, dysphagia, dyspepsia, persistent hoarseness, or nausea. He will resumes Saturday 5mg  injection 07/05/21- held last weeks injection due to upcoming GI procedure successfully completed yesterday.  2. Vitamin D deficiency On 04/09/2021, vitamin D level - 60.5 - stable. He is currently taking prescription ergocalciferol 50,000 IU one capsule every 14 days. He denies nausea, vomiting or muscle weakness.  3. At risk for diabetes mellitus Raymond Jordan is at higher than average risk for developing diabetes due to prediabetes and obesity.   Assessment/Plan:   1. Pre-diabetes Check labs today. Refill Mounjaro 5 mg once weekly.  - Refill tirzepatide (MOUNJARO) 5 MG/0.5ML Pen; Inject 5 mg into the skin once a week.  Dispense: 2 mL; Refill: 0 - Hemoglobin A1c - Insulin, random  2. Vitamin D deficiency Check vitamin D level today. Continue ergocalciferol 50,000 IU one capsule every 14 days.  No need for refill.  - VITAMIN D 25 Hydroxy (Vit-D Deficiency,  Fractures)  3. At risk for diabetes mellitus Raymond Jordan was given approximately 15 minutes of diabetes education and counseling today. We discussed intensive lifestyle modifications today with an emphasis on weight loss as well as increasing exercise and decreasing simple carbohydrates in his diet. We also reviewed medication options with an emphasis on risk versus benefit of those discussed.   Repetitive spaced learning was employed today to elicit superior memory formation and behavioral change.  4. Obesity BMI today is 35.8  Raymond Jordan is currently in the action stage of change. As such, his goal is to continue with weight loss efforts. He has agreed to the Category 3 Plan.   Exercise goals:  As is.  Behavioral modification strategies: increasing lean protein intake, decreasing simple carbohydrates, meal planning and cooking strategies, keeping healthy foods in the home, and planning for success.  Raymond Jordan has agreed to follow-up with our clinic in 4 weeks. He was informed of the importance of frequent follow-up visits to maximize his success with intensive lifestyle modifications for his multiple health conditions.   Raymond Jordan was informed we would discuss his lab results at his next visit unless there is a critical issue that needs to be addressed sooner. Raymond Jordan agreed to keep his next visit at the agreed upon time to discuss these results.  Objective:   Blood pressure 116/75, pulse 64, temperature 98.6 F (37 C), temperature source Oral, height 5\' 11"  (1.803 m), weight 257 lb (116.6 kg), SpO2 97 %. Body mass index is 35.84 kg/m.  General: Cooperative, alert, well developed, in no acute distress. HEENT: Conjunctivae and lids unremarkable. Cardiovascular:  Regular rhythm.  Lungs: Normal work of breathing. Neurologic: No focal deficits.   Lab Results  Component Value Date   CREATININE 1.08 05/08/2021   BUN 14 05/08/2021   NA 143 05/08/2021   K 4.4 05/08/2021   CL 105 05/08/2021   CO2 25  05/08/2021   Lab Results  Component Value Date   ALT 15 05/08/2021   AST 16 05/08/2021   ALKPHOS 67 05/08/2021   BILITOT 0.4 05/08/2021   Lab Results  Component Value Date   HGBA1C 5.9 (H) 04/09/2021   HGBA1C 6.1 (H) 11/06/2020   HGBA1C 6.3 06/03/2020   HGBA1C 6.2 (H) 01/18/2020   Lab Results  Component Value Date   INSULIN 14.0 04/09/2021   INSULIN 27.9 (H) 01/18/2020   Lab Results  Component Value Date   TSH 2.040 01/18/2020   Lab Results  Component Value Date   CHOL 115 04/09/2021   HDL 38 (L) 04/09/2021   LDLCALC 63 04/09/2021   TRIG 67 04/09/2021   CHOLHDL 3.0 04/09/2021   Lab Results  Component Value Date   VD25OH 60.5 04/09/2021   VD25OH 45.2 11/06/2020   VD25OH 22.1 (L) 01/18/2020   Lab Results  Component Value Date   WBC 9.1 04/04/2021   HGB 14.6 04/04/2021   HCT 44.7 04/04/2021   MCV 87.1 04/04/2021   PLT 221 04/04/2021   Attestation Statements:   Reviewed by clinician on day of visit: allergies, medications, problem list, medical history, surgical history, family history, social history, and previous encounter notes.  I, Insurance claims handler, CMA, am acting as Energy manager for William Hamburger, NP.  I have reviewed the above documentation for accuracy and completeness, and I agree with the above. -  Domini Vandehei d. Diamon Reddinger, NP-C

## 2021-07-03 LAB — INSULIN, RANDOM: INSULIN: 23.8 u[IU]/mL (ref 2.6–24.9)

## 2021-07-03 LAB — HEMOGLOBIN A1C
Est. average glucose Bld gHb Est-mCnc: 117 mg/dL
Hgb A1c MFr Bld: 5.7 % — ABNORMAL HIGH (ref 4.8–5.6)

## 2021-07-03 LAB — VITAMIN D 25 HYDROXY (VIT D DEFICIENCY, FRACTURES): Vit D, 25-Hydroxy: 38.3 ng/mL (ref 30.0–100.0)

## 2021-07-05 ENCOUNTER — Other Ambulatory Visit: Payer: Self-pay | Admitting: Cardiology

## 2021-07-29 ENCOUNTER — Ambulatory Visit (INDEPENDENT_AMBULATORY_CARE_PROVIDER_SITE_OTHER): Payer: 59 | Admitting: Adult Health

## 2021-07-29 ENCOUNTER — Encounter (INDEPENDENT_AMBULATORY_CARE_PROVIDER_SITE_OTHER): Payer: Self-pay | Admitting: Adult Health

## 2021-07-29 ENCOUNTER — Other Ambulatory Visit: Payer: Self-pay

## 2021-07-29 ENCOUNTER — Other Ambulatory Visit (HOSPITAL_COMMUNITY): Payer: Self-pay

## 2021-07-29 VITALS — BP 124/77 | HR 68 | Temp 98.5°F | Ht 71.0 in | Wt 259.0 lb

## 2021-07-29 DIAGNOSIS — Z6836 Body mass index (BMI) 36.0-36.9, adult: Secondary | ICD-10-CM

## 2021-07-29 DIAGNOSIS — M25551 Pain in right hip: Secondary | ICD-10-CM | POA: Insufficient documentation

## 2021-07-29 DIAGNOSIS — M25552 Pain in left hip: Secondary | ICD-10-CM

## 2021-07-29 DIAGNOSIS — E7849 Other hyperlipidemia: Secondary | ICD-10-CM | POA: Diagnosis not present

## 2021-07-29 DIAGNOSIS — R7303 Prediabetes: Secondary | ICD-10-CM | POA: Diagnosis not present

## 2021-07-29 DIAGNOSIS — Z9189 Other specified personal risk factors, not elsewhere classified: Secondary | ICD-10-CM

## 2021-07-29 DIAGNOSIS — Z6841 Body Mass Index (BMI) 40.0 and over, adult: Secondary | ICD-10-CM

## 2021-07-29 DIAGNOSIS — E559 Vitamin D deficiency, unspecified: Secondary | ICD-10-CM

## 2021-07-29 DIAGNOSIS — E669 Obesity, unspecified: Secondary | ICD-10-CM

## 2021-07-29 MED ORDER — VITAMIN D (ERGOCALCIFEROL) 1.25 MG (50000 UNIT) PO CAPS: ORAL_CAPSULE | ORAL | 0 refills | Status: DC

## 2021-07-29 MED ORDER — TIRZEPATIDE 5 MG/0.5ML ~~LOC~~ SOAJ
5.0000 mg | SUBCUTANEOUS | 0 refills | Status: DC
  Filled 2021-07-29: qty 2, 28d supply, fill #0

## 2021-07-29 NOTE — Progress Notes (Signed)
Chief Complaint:   OBESITY [name] is here to discuss his progress with his obesity treatment plan along with follow-up of his obesity related diagnoses. Raymond Jordan is on the Category 3 Plan and states he is following his eating plan approximately 50% of the time. Raymond Jordan states he is not exercising regularly at this time.  Today's visit was #: 19 Starting weight: 292 lbs Starting date: 01/18/2020 Today's weight: 259 lbs Today's date: 07/29/2021 Total lbs lost to date: 33 lbs Total lbs lost since last in-office visit: 0  Interim History:  Raymond Jordan says that when he does not follow Category 3, it will mean he consumes food that is "off plan".   However he does not feel that he is under/over eating- very aware of portion sizes. He was started on Mounjaro 2.5 mg on 02/12/2021. He increased Mounjaro to 5 mg on 03/11/2021.  2023 Goals:   1) With improved weather, increase daily walking, increase yard work.   2) Lose 15 pounds by October- which would be down to 244 lbs  Subjective:   1. Pre-diabetes Raymond Jordan says that when he does not follow Category 3, it will mean he consumes food that is "off plan".   However he does not feel that he is under/over eating- very aware of portion sizes. He was started on Mounjaro 2.5 mg on 02/12/2021. He increased Mounjaro to 5 mg on 03/11/2021. He denies mass in neck, dysphagia, dyspepsia, persistent hoarseness, or GI upset.  2. Other hyperlipidemia He is on pravastatin 80 mg daily - denies myalgias. On 04/09/2021, lipid panel - stable, HDL low - 38.  3. Bilateral hip pain He reports weight loss has decreased overall bilateral hip pain.  4. Vitamin D deficiency Discussed labs with patient today.  On 07/02/2021 - vitamin D level - 38.3. He is currently taking prescription ergocalciferol 50,000 IU every 14 days. He denies nausea, vomiting or muscle weakness.  5. At risk for osteoporosis Jacub is at higher risk of osteopenia and osteoporosis due to Vitamin  D deficiency.   Assessment/Plan:   1. Pre-diabetes Refill Mounjaro 5 mg subcutaneously once weekly.  - Refill tirzepatide (MOUNJARO) 5 MG/0.5ML Pen; Inject 5 mg into the skin once a week.  Dispense: 2 mL; Refill: 0  2. Other hyperlipidemia Continue pravastatin 80 mg daily, increase daily walking.  3. Bilateral hip pain Continue with weight loss efforts/orthopedic care.  4. Vitamin D deficiency Increase ergocalciferol from every 14 days to 7 days.  - Refill Vitamin D, Ergocalciferol, (DRISDOL) 1.25 MG (50000 UNIT) CAPS capsule; One Cap every 7 days  Dispense: 8 capsule; Refill: 0  5. At risk for osteoporosis Raymond Jordan was given approximately 15 minutes of osteoporosis prevention counseling today. Raymond Jordan is at risk for osteopenia and osteoporosis due to his Vitamin D deficiency. He was encouraged to take his Vitamin D and follow his higher calcium diet and increase strengthening exercise to help strengthen his bones and decrease his risk of osteopenia and osteoporosis.  Repetitive spaced learning was employed today to elicit superior memory formation and behavioral change.  6. Obesity BMI today is 36.2  Raymond Jordan is currently in the action stage of change. As such, his goal is to continue with weight loss efforts. He has agreed to the Category 3 Plan.   Exercise goals:  Increase daily walking.  Behavioral modification strategies: increasing lean protein intake, decreasing simple carbohydrates, meal planning and cooking strategies, keeping healthy foods in the home, and planning for success.  Raymond Jordan has agreed to  follow-up with our clinic in 4-6 weeks. He was informed of the importance of frequent follow-up visits to maximize his success with intensive lifestyle modifications for his multiple health conditions.   Objective:   Blood pressure 124/77, pulse 68, temperature 98.5 F (36.9 C), height 5\' 11"  (1.803 m), weight 259 lb (117.5 kg), SpO2 97 %. Body mass index is 36.12  kg/m.  General: Cooperative, alert, well developed, in no acute distress. HEENT: Conjunctivae and lids unremarkable. Cardiovascular: Regular rhythm.  Lungs: Normal work of breathing. Neurologic: No focal deficits.   Lab Results  Component Value Date   CREATININE 1.08 05/08/2021   BUN 14 05/08/2021   NA 143 05/08/2021   K 4.4 05/08/2021   CL 105 05/08/2021   CO2 25 05/08/2021   Lab Results  Component Value Date   ALT 15 05/08/2021   AST 16 05/08/2021   ALKPHOS 67 05/08/2021   BILITOT 0.4 05/08/2021   Lab Results  Component Value Date   HGBA1C 5.7 (H) 07/02/2021   HGBA1C 5.9 (H) 04/09/2021   HGBA1C 6.1 (H) 11/06/2020   HGBA1C 6.3 06/03/2020   HGBA1C 6.2 (H) 01/18/2020   Lab Results  Component Value Date   INSULIN 23.8 07/02/2021   INSULIN 14.0 04/09/2021   INSULIN 27.9 (H) 01/18/2020   Lab Results  Component Value Date   TSH 2.040 01/18/2020   Lab Results  Component Value Date   CHOL 115 04/09/2021   HDL 38 (L) 04/09/2021   LDLCALC 63 04/09/2021   TRIG 67 04/09/2021   CHOLHDL 3.0 04/09/2021   Lab Results  Component Value Date   VD25OH 38.3 07/02/2021   VD25OH 60.5 04/09/2021   VD25OH 45.2 11/06/2020   Lab Results  Component Value Date   WBC 9.1 04/04/2021   HGB 14.6 04/04/2021   HCT 44.7 04/04/2021   MCV 87.1 04/04/2021   PLT 221 04/04/2021   Attestation Statements:   Reviewed by clinician on day of visit: allergies, medications, problem list, medical history, surgical history, family history, social history, and previous encounter notes.  I, 04/06/2021, CMA, am acting as Insurance claims handler for Energy manager, NP.  I have reviewed the above documentation for accuracy and completeness, and I agree with the above. -  Demeisha Geraghty d. Jemeka Wagler, NP-C

## 2021-07-30 ENCOUNTER — Ambulatory Visit (INDEPENDENT_AMBULATORY_CARE_PROVIDER_SITE_OTHER): Payer: 59 | Admitting: Adult Health

## 2021-08-01 ENCOUNTER — Encounter (INDEPENDENT_AMBULATORY_CARE_PROVIDER_SITE_OTHER): Payer: Self-pay | Admitting: Adult Health

## 2021-08-01 ENCOUNTER — Other Ambulatory Visit (HOSPITAL_COMMUNITY): Payer: Self-pay

## 2021-08-04 NOTE — Telephone Encounter (Signed)
Last OV with Katy 

## 2021-08-08 ENCOUNTER — Other Ambulatory Visit (HOSPITAL_COMMUNITY): Payer: Self-pay

## 2021-08-11 ENCOUNTER — Encounter (INDEPENDENT_AMBULATORY_CARE_PROVIDER_SITE_OTHER): Payer: Self-pay | Admitting: Adult Health

## 2021-08-11 NOTE — Telephone Encounter (Signed)
Last OV with Katy 

## 2021-08-12 NOTE — Telephone Encounter (Signed)
PA needed

## 2021-08-13 NOTE — Telephone Encounter (Signed)
Prior authorization has been started for Mounjaro. Will notify patient and provider once response is received.  

## 2021-08-18 ENCOUNTER — Encounter (INDEPENDENT_AMBULATORY_CARE_PROVIDER_SITE_OTHER): Payer: Self-pay | Admitting: Adult Health

## 2021-08-18 NOTE — Telephone Encounter (Signed)
Can you please check on this?

## 2021-08-18 NOTE — Telephone Encounter (Signed)
Prior authorization was denied for [facility]. All information was sent to complete authorization. Patient is denied Mounjaro every month. Patient dx is prediabetes. Patient has been getting Mounjaro for awhile and using coupon card. Thanks!

## 2021-09-01 ENCOUNTER — Ambulatory Visit: Payer: Self-pay | Admitting: Surgery

## 2021-09-01 ENCOUNTER — Ambulatory Visit (INDEPENDENT_AMBULATORY_CARE_PROVIDER_SITE_OTHER): Payer: 59 | Admitting: Family Medicine

## 2021-09-01 ENCOUNTER — Other Ambulatory Visit: Payer: Self-pay

## 2021-09-01 ENCOUNTER — Other Ambulatory Visit (HOSPITAL_COMMUNITY): Payer: Self-pay

## 2021-09-01 ENCOUNTER — Encounter (INDEPENDENT_AMBULATORY_CARE_PROVIDER_SITE_OTHER): Payer: Self-pay | Admitting: Family Medicine

## 2021-09-01 VITALS — BP 116/70 | HR 56 | Temp 98.3°F | Ht 71.0 in | Wt 265.0 lb

## 2021-09-01 DIAGNOSIS — E669 Obesity, unspecified: Secondary | ICD-10-CM | POA: Diagnosis not present

## 2021-09-01 DIAGNOSIS — R7303 Prediabetes: Secondary | ICD-10-CM

## 2021-09-01 DIAGNOSIS — Z6837 Body mass index (BMI) 37.0-37.9, adult: Secondary | ICD-10-CM | POA: Diagnosis not present

## 2021-09-01 DIAGNOSIS — Z6841 Body Mass Index (BMI) 40.0 and over, adult: Secondary | ICD-10-CM

## 2021-09-01 MED ORDER — SEMAGLUTIDE-WEIGHT MANAGEMENT 0.25 MG/0.5ML ~~LOC~~ SOAJ
0.2500 mg | SUBCUTANEOUS | 0 refills | Status: DC
  Filled 2021-09-01: qty 2, 28d supply, fill #0

## 2021-09-02 ENCOUNTER — Encounter (INDEPENDENT_AMBULATORY_CARE_PROVIDER_SITE_OTHER): Payer: Self-pay

## 2021-09-02 ENCOUNTER — Telehealth: Payer: Self-pay | Admitting: *Deleted

## 2021-09-02 NOTE — Telephone Encounter (Signed)
° °  Pre-operative Risk Assessment    Patient Name: Raymond Jordan.  DOB: 25-Feb-1961 MRN: [id number]      Request for Surgical Clearance    Procedure:   HEMORRHOIDECTOMY  Date of Surgery:  Clearance TBD                                 Surgeon:  DR. Karie Soda Surgeon's Group or Practice Name:  [facility] Phone number:  [phone] Fax number:  [phone] ATTN: Ethlyn Gallery, CMA   Type of Clearance Requested:   - Medical  - Pharmacy:  Hold Clopidogrel (Plavix)     Type of Anesthesia:  General    Additional requests/questions:    Elpidio Anis   09/02/2021, 6:32 PM

## 2021-09-02 NOTE — Progress Notes (Signed)
Chief Complaint:   OBESITY Raymond Jordan is here to discuss his progress with his obesity treatment plan along with follow-up of his obesity related diagnoses. Raymond Jordan is on the Category 3 Plan and states he is following his eating plan approximately 60% of the time. Raymond Jordan states he has been doing some walking.   Today's visit was #: 20 Starting weight: 292 lbs Starting date: 01/18/2020 Today's weight: 265 lbs Today's date: 09/01/2021 Total lbs lost to date: 27 Total lbs lost since last in-office visit: 0  Interim History: Raymond Jordan has struggled more with meal planning and weight gain in the last month. He is open to staring a GLP-1 to help with weight loss, now that he is not on Mounjaro any longer. He has questions on what food to eat, meal planning, etc.  Subjective:   1. Pre-diabetes Raymond Jordan's highest A1c was 6.3 before he started working on diet and weight loss, but his insurance will no longer cover. He is struggling more with weight gain since.  Assessment/Plan:   1. Pre-diabetes Raymond Jordan will start a different GLP-1, and will continue with diet and exercise.  2. Obesity BMI today is 37.0 Raymond Jordan is currently in the action stage of change. As such, his goal is to continue with weight loss efforts. He has agreed to the Category 3 Plan.   We discussed various medication options to help Raymond Jordan with his weight loss efforts and we both agreed to start Wegovy 0.25 mg weekly with no refills.  - Semaglutide-Weight Management 0.25 MG/0.5ML SOAJ; Inject 0.25 mg into the skin once a week.  Dispense: 2 mL; Refill: 0  Exercise goals: As is.  Behavioral modification strategies: increasing lean protein intake.  Raymond Jordan has agreed to follow-up with our clinic in 4 weeks. He was informed of the importance of frequent follow-up visits to maximize his success with intensive lifestyle modifications for his multiple health conditions.   Objective:   Blood pressure 116/70, pulse (!) 56, temperature  98.3 F (36.8 C), height 5\' 11"  (1.803 m), weight 265 lb (120.2 kg), SpO2 99 %. Body mass index is 36.96 kg/m.  General: Cooperative, alert, well developed, in no acute distress. HEENT: Conjunctivae and lids unremarkable. Cardiovascular: Regular rhythm.  Lungs: Normal work of breathing. Neurologic: No focal deficits.   Lab Results  Component Value Date   CREATININE 1.08 05/08/2021   BUN 14 05/08/2021   NA 143 05/08/2021   K 4.4 05/08/2021   CL 105 05/08/2021   CO2 25 05/08/2021   Lab Results  Component Value Date   ALT 15 05/08/2021   AST 16 05/08/2021   ALKPHOS 67 05/08/2021   BILITOT 0.4 05/08/2021   Lab Results  Component Value Date   HGBA1C 5.7 (H) 07/02/2021   HGBA1C 5.9 (H) 04/09/2021   HGBA1C 6.1 (H) 11/06/2020   HGBA1C 6.3 06/03/2020   HGBA1C 6.2 (H) 01/18/2020   Lab Results  Component Value Date   INSULIN 23.8 07/02/2021   INSULIN 14.0 04/09/2021   INSULIN 27.9 (H) 01/18/2020   Lab Results  Component Value Date   TSH 2.040 01/18/2020   Lab Results  Component Value Date   CHOL 115 04/09/2021   HDL 38 (L) 04/09/2021   LDLCALC 63 04/09/2021   TRIG 67 04/09/2021   CHOLHDL 3.0 04/09/2021   Lab Results  Component Value Date   VD25OH 38.3 07/02/2021   VD25OH 60.5 04/09/2021   VD25OH 45.2 11/06/2020   Lab Results  Component Value Date   WBC 9.1  04/04/2021   HGB 14.6 04/04/2021   HCT 44.7 04/04/2021   MCV 87.1 04/04/2021   PLT 221 04/04/2021   No results found for: IRON, TIBC, FERRITIN  Attestation Statements:   Reviewed by clinician on day of visit: allergies, medications, problem list, medical history, surgical history, family history, social history, and previous encounter notes.  Time spent on visit including pre-visit chart review and post-visit care and charting was 35 minutes.    I, Burt Knack, am acting as transcriptionist for Quillian Quince, MD.  I have reviewed the above documentation for accuracy and completeness, and I agree  with the above. -  Quillian Quince, MD

## 2021-09-03 NOTE — Telephone Encounter (Signed)
? ?  Name: Raymond Jordan.  ?DOB: Feb 14, 1961  ?MRN: [id number]  ? ?Primary Cardiologist: Fransico Him, MD ? ?Chart reviewed as part of pre-operative protocol coverage. Patient was contacted 09/03/2021 in reference to pre-operative risk assessment for pending surgery as outlined below.  [name] was last seen on 01/10/21 by Dr. Radford Pax.  Since that day, Riyan Faron. has done well. ? ?Therefore, based on ACC/AHA guidelines, the patient would be at acceptable risk for the planned procedure without further cardiovascular testing.  ? ?The patient was advised that if he develops new symptoms prior to surgery to contact our office to arrange for a follow-up visit, and he verbalized understanding. ? ?Pt has a history of CAD with DES to RCA in 2017, nonischemic stress test in 01/2020. He remains on ASA and plavix. ? ?Dr. Radford Pax, Please comment on holding plavix? No ASA request.  ? ?Please forward your response to [facility] PREOP.  ? ?Thank you  ? ?Leanor Kail, PA ?09/03/2021, 12:07 PM  ?

## 2021-09-03 NOTE — Telephone Encounter (Signed)
Okay to hold for 5 days

## 2021-09-05 NOTE — Telephone Encounter (Signed)
? ?  Primary Cardiologist: Fransico Him, MD ? ?Chart reviewed as part of pre-operative protocol coverage. Given past medical history and time since last visit, based on ACC/AHA guidelines, Raymond Jordan. would be at acceptable risk for the planned procedure without further cardiovascular testing.  ? ?His Plavix may be held for 5 days prior to his procedure.  Please resume as soon as hemostasis is achieved. ? ?I will route this recommendation to the requesting party via Epic fax function and remove from pre-op pool. ? ?Please call with questions. ? ?Jossie Ng. Rashanna Christiana NP-C ? ?  ?09/05/2021, 8:21 AM ?[place] ?[address] ?Office [phone] Fax [phone] ? ? ? ? ?

## 2021-09-08 NOTE — Telephone Encounter (Signed)
Patient called and said surgeon did not get the clearance. Patient asked that our office re-send the clearance to Dr. Michaell Cowing at [phone] Attn: Ethlyn Gallery CMA  ?

## 2021-09-10 ENCOUNTER — Other Ambulatory Visit (HOSPITAL_COMMUNITY): Payer: Self-pay

## 2021-09-22 ENCOUNTER — Encounter (INDEPENDENT_AMBULATORY_CARE_PROVIDER_SITE_OTHER): Payer: Self-pay | Admitting: Adult Health

## 2021-09-22 NOTE — Telephone Encounter (Signed)
Please see message and advise.  Thank you. ° °

## 2021-09-22 NOTE — Telephone Encounter (Signed)
Dr.Beasley 

## 2021-09-30 ENCOUNTER — Ambulatory Visit (INDEPENDENT_AMBULATORY_CARE_PROVIDER_SITE_OTHER): Payer: 59 | Admitting: Family Medicine

## 2021-09-30 ENCOUNTER — Other Ambulatory Visit: Payer: Self-pay

## 2021-09-30 ENCOUNTER — Encounter (INDEPENDENT_AMBULATORY_CARE_PROVIDER_SITE_OTHER): Payer: Self-pay | Admitting: Family Medicine

## 2021-09-30 VITALS — BP 123/74 | Temp 98.4°F | Ht 71.0 in | Wt 270.0 lb

## 2021-09-30 DIAGNOSIS — Z6837 Body mass index (BMI) 37.0-37.9, adult: Secondary | ICD-10-CM

## 2021-09-30 DIAGNOSIS — E669 Obesity, unspecified: Secondary | ICD-10-CM

## 2021-09-30 DIAGNOSIS — E559 Vitamin D deficiency, unspecified: Secondary | ICD-10-CM | POA: Diagnosis not present

## 2021-09-30 DIAGNOSIS — Z9189 Other specified personal risk factors, not elsewhere classified: Secondary | ICD-10-CM

## 2021-09-30 DIAGNOSIS — R7303 Prediabetes: Secondary | ICD-10-CM | POA: Diagnosis not present

## 2021-09-30 MED ORDER — METFORMIN HCL 500 MG PO TABS: 500.0000 mg | ORAL_TABLET | Freq: Every day | ORAL | 0 refills | Status: DC

## 2021-09-30 MED ORDER — VITAMIN D (ERGOCALCIFEROL) 1.25 MG (50000 UNIT) PO CAPS: ORAL_CAPSULE | ORAL | 0 refills | Status: DC

## 2021-10-01 ENCOUNTER — Encounter (INDEPENDENT_AMBULATORY_CARE_PROVIDER_SITE_OTHER): Payer: Self-pay | Admitting: Family Medicine

## 2021-10-01 ENCOUNTER — Ambulatory Visit (INDEPENDENT_AMBULATORY_CARE_PROVIDER_SITE_OTHER): Payer: 59 | Admitting: Physician Assistant

## 2021-10-04 ENCOUNTER — Other Ambulatory Visit (HOSPITAL_COMMUNITY): Payer: Self-pay

## 2021-10-06 NOTE — Progress Notes (Signed)
? ? ? ?Chief Complaint:  ? ?OBESITY ?Raymond Jordan is here to discuss his progress with his obesity treatment plan along with follow-up of his obesity related diagnoses. Raymond Jordan is on the Category 3 Plan and states he is following his eating plan approximately 65% of the time. Sheila states he is doing 0 minutes 0 times per week. ? ?Today's visit was #: 21 ?Starting weight: 292 lbs ?Starting date: 01/18/2020 ?Today's weight: 270 lbs ?Today's date: 09/30/2021 ?Total lbs lost to date: 22 ?Total lbs lost since last in-office visit: 0 ? ?Interim History: Raymond Jordan has struggled with weight gain in the last month. He had done well on Mounjaro, but he cant get coverage. His insurance will not cover Wegovy either. His hunger is a challenge.  ? ?Subjective:  ? ?1. Pre-diabetes ?Raymond Jordan is struggling with polyphagia, and he is unable to take a GLP-1. ? ?2. Vitamin D deficiency ?Raymond Jordan is on Vitamin D, with no side effects noted. ? ?3. At risk for heart disease ?Raymond Jordan is at higher than average risk for cardiovascular disease due to obesity. ? ?Assessment/Plan:  ? ?1. Pre-diabetes ?Raymond Jordan agreed to start metformin 500 mg q AM with food, with no refills. Raymond Jordan will continue to work on weight loss, exercise, and decreasing simple carbohydrates to help decrease the risk of diabetes.  ? ?- metFORMIN (GLUCOPHAGE) 500 MG tablet; Take 1 tablet (500 mg total) by mouth daily with breakfast.  Dispense: 30 tablet; Refill: 0 ? ?2. Vitamin D deficiency ?We will refill prescription Vitamin D for 90 days with no refills. Raymond Jordan will follow-up for routine testing of Vitamin D, at least 2-3 times per year to avoid over-replacement. ? ?- Vitamin D, Ergocalciferol, (DRISDOL) 1.25 MG (50000 UNIT) CAPS capsule; One Cap every 7 days  Dispense: 12 capsule; Refill: 0 ? ?3. At risk for heart disease ?Raymond Jordan was given approximately 15 minutes of coronary artery disease prevention counseling today. He is 61 y.o. male and has risk factors for heart disease  including obesity. We discussed intensive lifestyle modifications today with an emphasis on specific weight loss instructions and strategies. ? ?Repetitive spaced learning was employed today to elicit superior memory formation and behavioral change.  ? ?4. Obesity BMI today is 37.0 ?Raymond Jordan is currently in the action stage of change. As such, his goal is to continue with weight loss efforts. He has agreed to change to following a lower carbohydrate, vegetable and lean protein rich diet plan, after hemorrhoidectomy.  ? ?Behavioral modification strategies: increasing lean protein intake and meal planning and cooking strategies. ? ?Raymond Jordan has agreed to follow-up with our clinic in 4 weeks. He was informed of the importance of frequent follow-up visits to maximize his success with intensive lifestyle modifications for his multiple health conditions.  ? ?Objective:  ? ?Blood pressure 123/74, temperature 98.4 ?F (36.9 ?C), height 5\' 11"  (1.803 m), weight 270 lb (122.5 kg). ?Body mass index is 37.66 kg/m?. ? ?General: Cooperative, alert, well developed, in no acute distress. ?HEENT: Conjunctivae and lids unremarkable. ?Cardiovascular: Regular rhythm.  ?Lungs: Normal work of breathing. ?Neurologic: No focal deficits.  ? ?Lab Results  ?Component Value Date  ? CREATININE 1.08 05/08/2021  ? BUN 14 05/08/2021  ? NA 143 05/08/2021  ? K 4.4 05/08/2021  ? CL 105 05/08/2021  ? CO2 25 05/08/2021  ? ?Lab Results  ?Component Value Date  ? ALT 15 05/08/2021  ? AST 16 05/08/2021  ? ALKPHOS 67 05/08/2021  ? BILITOT 0.4 05/08/2021  ? ?Lab Results  ?Component Value  Date  ? HGBA1C 5.7 (H) 07/02/2021  ? HGBA1C 5.9 (H) 04/09/2021  ? HGBA1C 6.1 (H) 11/06/2020  ? HGBA1C 6.3 06/03/2020  ? HGBA1C 6.2 (H) 01/18/2020  ? ?Lab Results  ?Component Value Date  ? INSULIN 23.8 07/02/2021  ? INSULIN 14.0 04/09/2021  ? INSULIN 27.9 (H) 01/18/2020  ? ?Lab Results  ?Component Value Date  ? TSH 2.040 01/18/2020  ? ?Lab Results  ?Component Value Date  ? CHOL  115 04/09/2021  ? HDL 38 (L) 04/09/2021  ? LDLCALC 63 04/09/2021  ? TRIG 67 04/09/2021  ? CHOLHDL 3.0 04/09/2021  ? ?Lab Results  ?Component Value Date  ? VD25OH 38.3 07/02/2021  ? VD25OH 60.5 04/09/2021  ? VD25OH 45.2 11/06/2020  ? ?Lab Results  ?Component Value Date  ? WBC 9.1 04/04/2021  ? HGB 14.6 04/04/2021  ? HCT 44.7 04/04/2021  ? MCV 87.1 04/04/2021  ? PLT 221 04/04/2021  ? ?No results found for: IRON, TIBC, FERRITIN ? ?Attestation Statements:  ? ?Reviewed by clinician on day of visit: allergies, medications, problem list, medical history, surgical history, family history, social history, and previous encounter notes. ? ? ?I, Burt Knack, am acting as transcriptionist for Quillian Quince, MD. ? ?I have reviewed the above documentation for accuracy and completeness, and I agree with the above. -  Quillian Quince, MD ? ? ?

## 2021-10-21 ENCOUNTER — Encounter (INDEPENDENT_AMBULATORY_CARE_PROVIDER_SITE_OTHER): Payer: Self-pay | Admitting: Family Medicine

## 2021-10-21 NOTE — Telephone Encounter (Signed)
Please advise 

## 2021-10-22 ENCOUNTER — Other Ambulatory Visit (INDEPENDENT_AMBULATORY_CARE_PROVIDER_SITE_OTHER): Payer: Self-pay

## 2021-10-22 DIAGNOSIS — I10 Essential (primary) hypertension: Secondary | ICD-10-CM

## 2021-10-22 MED ORDER — BLOOD PRESSURE MONITOR KIT: PACK | 0 refills | Status: AC

## 2021-10-22 NOTE — Telephone Encounter (Signed)
Please send in a rx for this

## 2021-10-28 ENCOUNTER — Ambulatory Visit (INDEPENDENT_AMBULATORY_CARE_PROVIDER_SITE_OTHER): Payer: 59 | Admitting: Family Medicine

## 2021-10-28 ENCOUNTER — Other Ambulatory Visit (INDEPENDENT_AMBULATORY_CARE_PROVIDER_SITE_OTHER): Payer: Self-pay | Admitting: Family Medicine

## 2021-10-28 DIAGNOSIS — R7303 Prediabetes: Secondary | ICD-10-CM

## 2021-11-18 ENCOUNTER — Ambulatory Visit (INDEPENDENT_AMBULATORY_CARE_PROVIDER_SITE_OTHER): Payer: 59 | Admitting: Family Medicine

## 2021-11-18 ENCOUNTER — Encounter (INDEPENDENT_AMBULATORY_CARE_PROVIDER_SITE_OTHER): Payer: Self-pay | Admitting: Family Medicine

## 2021-11-18 ENCOUNTER — Other Ambulatory Visit (INDEPENDENT_AMBULATORY_CARE_PROVIDER_SITE_OTHER): Payer: Self-pay | Admitting: Family Medicine

## 2021-11-18 VITALS — BP 116/66 | HR 68 | Temp 98.4°F | Ht 71.0 in | Wt 263.0 lb

## 2021-11-18 DIAGNOSIS — E559 Vitamin D deficiency, unspecified: Secondary | ICD-10-CM | POA: Diagnosis not present

## 2021-11-18 DIAGNOSIS — E669 Obesity, unspecified: Secondary | ICD-10-CM | POA: Diagnosis not present

## 2021-11-18 DIAGNOSIS — R7303 Prediabetes: Secondary | ICD-10-CM

## 2021-11-18 DIAGNOSIS — Z6836 Body mass index (BMI) 36.0-36.9, adult: Secondary | ICD-10-CM

## 2021-11-18 DIAGNOSIS — Z9189 Other specified personal risk factors, not elsewhere classified: Secondary | ICD-10-CM

## 2021-11-18 MED ORDER — VITAMIN D (ERGOCALCIFEROL) 1.25 MG (50000 UNIT) PO CAPS: ORAL_CAPSULE | ORAL | 0 refills | Status: DC

## 2021-11-19 ENCOUNTER — Other Ambulatory Visit (INDEPENDENT_AMBULATORY_CARE_PROVIDER_SITE_OTHER): Payer: Self-pay | Admitting: Family Medicine

## 2021-11-19 ENCOUNTER — Encounter (INDEPENDENT_AMBULATORY_CARE_PROVIDER_SITE_OTHER): Payer: Self-pay | Admitting: Family Medicine

## 2021-11-19 DIAGNOSIS — R7303 Prediabetes: Secondary | ICD-10-CM

## 2021-11-19 MED ORDER — METFORMIN HCL 500 MG PO TABS: 500.0000 mg | ORAL_TABLET | Freq: Every day | ORAL | 0 refills | Status: DC

## 2021-11-19 NOTE — Telephone Encounter (Signed)
Beasley 

## 2021-11-24 ENCOUNTER — Ambulatory Visit (INDEPENDENT_AMBULATORY_CARE_PROVIDER_SITE_OTHER): Payer: 59 | Admitting: Family Medicine

## 2021-11-24 ENCOUNTER — Encounter (INDEPENDENT_AMBULATORY_CARE_PROVIDER_SITE_OTHER): Payer: Self-pay | Admitting: Family Medicine

## 2021-11-24 NOTE — Telephone Encounter (Signed)
I don't see any documentation that the patient is on Rybelsus. Please review chart.

## 2021-11-24 NOTE — Telephone Encounter (Signed)
Dr.Beasley 

## 2021-11-25 ENCOUNTER — Other Ambulatory Visit (INDEPENDENT_AMBULATORY_CARE_PROVIDER_SITE_OTHER): Payer: Self-pay | Admitting: Family Medicine

## 2021-11-25 NOTE — Telephone Encounter (Signed)
Its in his last unscribed note. Rybelsus 3mg  q day x 1 month please send in ASAP

## 2021-11-25 NOTE — Telephone Encounter (Signed)
Please review

## 2021-11-26 ENCOUNTER — Telehealth (INDEPENDENT_AMBULATORY_CARE_PROVIDER_SITE_OTHER): Payer: Self-pay | Admitting: Family Medicine

## 2021-11-26 ENCOUNTER — Encounter (INDEPENDENT_AMBULATORY_CARE_PROVIDER_SITE_OTHER): Payer: Self-pay

## 2021-11-26 MED ORDER — RYBELSUS 3 MG PO TABS: 3.0000 mg | ORAL_TABLET | Freq: Every day | ORAL | 0 refills | Status: DC

## 2021-11-26 NOTE — Telephone Encounter (Signed)
Dr. Beasley - Prior authorization denied for Rybelsus. Per insurance: Patient does not have type 2 diabetes. Patient sent denial message via mychart.  

## 2021-12-02 ENCOUNTER — Other Ambulatory Visit: Payer: Self-pay | Admitting: Cardiology

## 2021-12-02 DIAGNOSIS — E7800 Pure hypercholesterolemia, unspecified: Secondary | ICD-10-CM

## 2021-12-02 NOTE — Progress Notes (Signed)
Chief Complaint:   OBESITY Raymond Jordan is here to discuss his progress with his obesity treatment plan along with follow-up of his obesity related diagnoses. Raymond Jordan is on following a lower carbohydrate, vegetable and lean protein rich diet plan and states he is following his eating plan approximately 65% of the time. [name] states he is walking for 30 minutes 3 times per week.  Today's visit was #: 22 Starting weight: 292 lbs Starting date: 01/18/2020 Today's weight: 263 lbs Today's date: 11/18/2021 Total lbs lost to date: 29 Total lbs lost since last in-office visit: 7  Interim History: Raymond Jordan is doing well with weight loss. He is working on decreasing simple carbohydrates and increasing lean protein. He is increasing his walking.  Subjective:   1. Pre-diabetes [name] has a lot of questions about his medications.  2. Vitamin D deficiency [name] is on Vitamin D, but his level is not yet at goal.   3. At risk for diabetes mellitus Raymond Jordan is at higher than average risk for developing diabetes due to his obesity.  Assessment/Plan:   1. Pre-diabetes Raymond Jordan will continue metformin as is, and he agreed to start Rybelsus 3 mg q AM #30 with no refills. We will recheck labs in 1 month.  2. Vitamin D deficiency Raymond Jordan will continue prescription Vitamin D, and we will refill for 90 days with no refills. We will recheck labs in 1 month.  - Vitamin D, Ergocalciferol, (DRISDOL) 1.25 MG (50000 UNIT) CAPS capsule; One Cap every 7 days  Dispense: 12 capsule; Refill: 0  3. At risk for diabetes mellitus Raymond Jordan was given approximately 30 minutes of diabetic education and counseling today. We discussed intensive lifestyle modifications today with an emphasis on weight loss as well as increasing exercise and decreasing simple carbohydrates in his diet. We also reviewed medication options with an emphasis on risk versus benefits of those discussed.  Repetitive spaced learning was employed today to  elicit superior memory formation and behavioral change.  4. Obesity, Current BMI 36.8 Raymond Jordan is currently in the action stage of change. As such, his goal is to continue with weight loss efforts. He has agreed to following a lower carbohydrate, vegetable and lean protein rich diet plan.   We will recheck fasting labs at his next visit.  Exercise goals: As is.  Behavioral modification strategies: increasing lean protein intake.  Raymond Jordan has agreed to follow-up with our clinic in 4 weeks. He was informed of the importance of frequent follow-up visits to maximize his success with intensive lifestyle modifications for his multiple health conditions.   Objective:   Blood pressure 116/66, pulse 68, temperature 98.4 F (36.9 C), height 5\' 11"  (1.803 m), weight 263 lb (119.3 kg), SpO2 96 %. Body mass index is 36.68 kg/m.  General: Cooperative, alert, well developed, in no acute distress. HEENT: Conjunctivae and lids unremarkable. Cardiovascular: Regular rhythm.  Lungs: Normal work of breathing. Neurologic: No focal deficits.   Lab Results  Component Value Date   CREATININE 1.08 05/08/2021   BUN 14 05/08/2021   NA 143 05/08/2021   K 4.4 05/08/2021   CL 105 05/08/2021   CO2 25 05/08/2021   Lab Results  Component Value Date   ALT 15 05/08/2021   AST 16 05/08/2021   ALKPHOS 67 05/08/2021   BILITOT 0.4 05/08/2021   Lab Results  Component Value Date   HGBA1C 5.7 (H) 07/02/2021   HGBA1C 5.9 (H) 04/09/2021   HGBA1C 6.1 (H) 11/06/2020   HGBA1C 6.3 06/03/2020  HGBA1C 6.2 (H) 01/18/2020   Lab Results  Component Value Date   INSULIN 23.8 07/02/2021   INSULIN 14.0 04/09/2021   INSULIN 27.9 (H) 01/18/2020   Lab Results  Component Value Date   TSH 2.040 01/18/2020   Lab Results  Component Value Date   CHOL 115 04/09/2021   HDL 38 (L) 04/09/2021   LDLCALC 63 04/09/2021   TRIG 67 04/09/2021   CHOLHDL 3.0 04/09/2021   Lab Results  Component Value Date   VD25OH 38.3  07/02/2021   VD25OH 60.5 04/09/2021   VD25OH 45.2 11/06/2020   Lab Results  Component Value Date   WBC 9.1 04/04/2021   HGB 14.6 04/04/2021   HCT 44.7 04/04/2021   MCV 87.1 04/04/2021   PLT 221 04/04/2021   No results found for: IRON, TIBC, FERRITIN  Attestation Statements:   Reviewed by clinician on day of visit: allergies, medications, problem list, medical history, surgical history, family history, social history, and previous encounter notes.   I, Burt Knack, am acting as transcriptionist for Quillian Quince, MD.  I have reviewed the above documentation for accuracy and completeness, and I agree with the above. -  Quillian Quince, MD

## 2021-12-05 ENCOUNTER — Other Ambulatory Visit: Payer: Self-pay | Admitting: Cardiology

## 2021-12-05 DIAGNOSIS — E7800 Pure hypercholesterolemia, unspecified: Secondary | ICD-10-CM

## 2021-12-16 ENCOUNTER — Other Ambulatory Visit (INDEPENDENT_AMBULATORY_CARE_PROVIDER_SITE_OTHER): Payer: Self-pay | Admitting: Family Medicine

## 2021-12-16 DIAGNOSIS — R7303 Prediabetes: Secondary | ICD-10-CM

## 2021-12-22 ENCOUNTER — Ambulatory Visit (INDEPENDENT_AMBULATORY_CARE_PROVIDER_SITE_OTHER): Payer: 59 | Admitting: Family Medicine

## 2021-12-22 ENCOUNTER — Encounter (INDEPENDENT_AMBULATORY_CARE_PROVIDER_SITE_OTHER): Payer: Self-pay | Admitting: Family Medicine

## 2021-12-22 VITALS — BP 135/73 | HR 53 | Temp 98.3°F | Ht 71.0 in | Wt 268.0 lb

## 2021-12-22 DIAGNOSIS — E7800 Pure hypercholesterolemia, unspecified: Secondary | ICD-10-CM | POA: Diagnosis not present

## 2021-12-22 DIAGNOSIS — E559 Vitamin D deficiency, unspecified: Secondary | ICD-10-CM | POA: Diagnosis not present

## 2021-12-22 DIAGNOSIS — E669 Obesity, unspecified: Secondary | ICD-10-CM | POA: Diagnosis not present

## 2021-12-22 DIAGNOSIS — R7303 Prediabetes: Secondary | ICD-10-CM | POA: Diagnosis not present

## 2021-12-22 DIAGNOSIS — Z6837 Body mass index (BMI) 37.0-37.9, adult: Secondary | ICD-10-CM

## 2021-12-22 MED ORDER — METFORMIN HCL 500 MG PO TABS: 500.0000 mg | ORAL_TABLET | Freq: Every day | ORAL | 0 refills | Status: DC

## 2021-12-22 NOTE — Progress Notes (Signed)
Chief Complaint:   OBESITY Raymond Jordan is here to discuss his progress with his obesity treatment plan along with follow-up of his obesity related diagnoses. Raymond Jordan is on following a lower carbohydrate, vegetable and lean protein rich diet plan and states he is following his eating plan approximately 65% of the time. Marin states he is walking for 30 minutes 5 times per week.  Today's visit was #: 23 Starting weight: 292 lbs Starting date: 01/18/2020 Today's weight: 268 lbs Today's date: 12/22/2021 Total lbs lost to date: 24 Total lbs lost since last in-office visit: 0  Interim History: Raymond Jordan is retaining some fluid today. He has been mindful of his food choices but he is struggling to follow his low carbohydrate plan. He is open to looking at other options.  Subjective:   1. Pre-diabetes Raymond Jordan is due for labs. He continues to work on decreasing simple carbohydrates.   2. Vitamin D deficiency Raymond Jordan is on Vitamin D, and he is due for labs. He denies side effects.  3. Pure hypercholesterolemia Raymond Jordan is on pravastatin, and he is due for labs. He is working on decreasing cholesterol in his diet.  Assessment/Plan:   1. Pre-diabetes We will check labs today, and we will refill metformin 500 mg q AM #30 for 1 month.   - CMP14+EGFR - Hemoglobin A1c - Insulin, random  2. Vitamin D deficiency We will check labs today. Johnatha will continue his Vitamin D, and we will follow up at his next visit.  - VITAMIN D 25 Hydroxy (Vit-D Deficiency, Fractures)  3. Pure hypercholesterolemia We will check labs today. K[place]lin will continue to work on diet, exercise and weight loss efforts. Orders and follow up as documented in patient record.   - Lipid Panel With LDL/HDL Ratio  4. Obesity with current BMI of 37.4 Raymond Jordan is currently in the action stage of change. As such, his goal is to continue with weight loss efforts. He has agreed to keeping a food journal and adhering to recommended  goals of 1400-1700 calories and 100+ grams of protein daily or following a lower carbohydrate, vegetable and lean protein rich diet plan.   Exercise goals: As is.   Behavioral modification strategies: increasing lean protein intake.  Raymond Jordan has agreed to follow-up with our clinic in 4 weeks. He was informed of the importance of frequent follow-up visits to maximize his success with intensive lifestyle modifications for his multiple health conditions.   Raymond Jordan was informed we would discuss his lab results at his next visit unless there is a critical issue that needs to be addressed sooner. Raymond Jordan agreed to keep his next visit at the agreed upon time to discuss these results.  Objective:   Blood pressure 135/73, pulse (!) 53, temperature 98.3 F (36.8 C), height '5\' 11"'  (1.803 m), weight 268 lb (121.6 kg), SpO2 99 %. Body mass index is 37.38 kg/m.  General: Cooperative, alert, well developed, in no acute distress. HEENT: Conjunctivae and lids unremarkable. Cardiovascular: Regular rhythm.  Lungs: Normal work of breathing. Neurologic: No focal deficits.   Lab Results  Component Value Date   CREATININE 1.08 05/08/2021   BUN 14 05/08/2021   NA 143 05/08/2021   K 4.4 05/08/2021   CL 105 05/08/2021   CO2 25 05/08/2021   Lab Results  Component Value Date   ALT 15 05/08/2021   AST 16 05/08/2021   ALKPHOS 67 05/08/2021   BILITOT 0.4 05/08/2021   Lab Results  Component Value Date   HGBA1C  5.7 (H) 07/02/2021   HGBA1C 5.9 (H) 04/09/2021   HGBA1C 6.1 (H) 11/06/2020   HGBA1C 6.3 06/03/2020   HGBA1C 6.2 (H) 01/18/2020   Lab Results  Component Value Date   INSULIN 23.8 07/02/2021   INSULIN 14.0 04/09/2021   INSULIN 27.9 (H) 01/18/2020   Lab Results  Component Value Date   TSH 2.040 01/18/2020   Lab Results  Component Value Date   CHOL 115 04/09/2021   HDL 38 (L) 04/09/2021   LDLCALC 63 04/09/2021   TRIG 67 04/09/2021   CHOLHDL 3.0 04/09/2021   Lab Results  Component  Value Date   VD25OH 38.3 07/02/2021   VD25OH 60.5 04/09/2021   VD25OH 45.2 11/06/2020   Lab Results  Component Value Date   WBC 9.1 04/04/2021   HGB 14.6 04/04/2021   HCT 44.7 04/04/2021   MCV 87.1 04/04/2021   PLT 221 04/04/2021   No results found for: "IRON", "TIBC", "FERRITIN"  Attestation Statements:   Reviewed by clinician on day of visit: allergies, medications, problem list, medical history, surgical history, family history, social history, and previous encounter notes.   I, Trixie Dredge, am acting as transcriptionist for Dennard Nip, MD.  I have reviewed the above documentation for accuracy and completeness, and I agree with the above. -  Dennard Nip, MD

## 2021-12-24 LAB — LIPID PANEL WITH LDL/HDL RATIO
Cholesterol, Total: 143 mg/dL (ref 100–199)
HDL: 52 mg/dL (ref >39)
LDL Chol Calc (NIH): 73 mg/dL (ref 0–99)
LDL/HDL Ratio: 1.4 ratio (ref 0.0–3.6)
Triglycerides: 98 mg/dL (ref 0–149)
VLDL Cholesterol Cal: 18 mg/dL (ref 5–40)

## 2021-12-24 LAB — CMP14+EGFR
ALT: 10 IU/L (ref 0–44)
AST: 14 IU/L (ref 0–40)
Albumin/Globulin Ratio: 1.2 (ref 1.2–2.2)
Albumin: 4.2 g/dL (ref 3.8–4.9)
Alkaline Phosphatase: 64 IU/L (ref 44–121)
BUN/Creatinine Ratio: 13 (ref 9–20)
BUN: 18 mg/dL (ref 6–24)
Bilirubin Total: 0.3 mg/dL (ref 0.0–1.2)
CO2: 26 mmol/L (ref 20–29)
Calcium: 9.1 mg/dL (ref 8.7–10.2)
Chloride: 106 mmol/L (ref 96–106)
Creatinine, Ser: 1.38 mg/dL — ABNORMAL HIGH (ref 0.76–1.27)
Globulin, Total: 3.6 g/dL (ref 1.5–4.5)
Glucose: 102 mg/dL — ABNORMAL HIGH (ref 70–99)
Potassium: 3.8 mmol/L (ref 3.5–5.2)
Sodium: 144 mmol/L (ref 134–144)
Total Protein: 7.8 g/dL (ref 6.0–8.5)
eGFR: 59 mL/min/{1.73_m2} — ABNORMAL LOW (ref >59)

## 2021-12-24 LAB — VITAMIN D 25 HYDROXY (VIT D DEFICIENCY, FRACTURES): Vit D, 25-Hydroxy: 48.1 ng/mL (ref 30.0–100.0)

## 2021-12-24 LAB — HEMOGLOBIN A1C
Est. average glucose Bld gHb Est-mCnc: 120 mg/dL
Hgb A1c MFr Bld: 5.8 % — ABNORMAL HIGH (ref 4.8–5.6)

## 2021-12-24 LAB — INSULIN, RANDOM: INSULIN: 22.7 u[IU]/mL (ref 2.6–24.9)

## 2022-01-18 ENCOUNTER — Other Ambulatory Visit (INDEPENDENT_AMBULATORY_CARE_PROVIDER_SITE_OTHER): Payer: Self-pay | Admitting: Family Medicine

## 2022-01-18 DIAGNOSIS — R7303 Prediabetes: Secondary | ICD-10-CM

## 2022-01-19 ENCOUNTER — Ambulatory Visit (INDEPENDENT_AMBULATORY_CARE_PROVIDER_SITE_OTHER): Payer: 59 | Admitting: Family Medicine

## 2022-01-19 ENCOUNTER — Encounter (INDEPENDENT_AMBULATORY_CARE_PROVIDER_SITE_OTHER): Payer: Self-pay | Admitting: Family Medicine

## 2022-01-19 VITALS — BP 128/72 | HR 57 | Temp 98.0°F | Ht 71.0 in | Wt 266.0 lb

## 2022-01-19 DIAGNOSIS — F3289 Other specified depressive episodes: Secondary | ICD-10-CM | POA: Diagnosis not present

## 2022-01-19 DIAGNOSIS — R748 Abnormal levels of other serum enzymes: Secondary | ICD-10-CM

## 2022-01-19 DIAGNOSIS — Z6837 Body mass index (BMI) 37.0-37.9, adult: Secondary | ICD-10-CM

## 2022-01-19 DIAGNOSIS — E669 Obesity, unspecified: Secondary | ICD-10-CM

## 2022-01-19 DIAGNOSIS — R7303 Prediabetes: Secondary | ICD-10-CM | POA: Diagnosis not present

## 2022-01-19 MED ORDER — METFORMIN HCL 500 MG PO TABS: 500.0000 mg | ORAL_TABLET | Freq: Every day | ORAL | 0 refills | Status: DC

## 2022-01-20 ENCOUNTER — Ambulatory Visit (INDEPENDENT_AMBULATORY_CARE_PROVIDER_SITE_OTHER): Payer: 59 | Admitting: Family Medicine

## 2022-01-20 NOTE — Progress Notes (Signed)
Chief Complaint:   OBESITY Raymond Jordan is here to discuss his progress with his obesity treatment plan along with follow-up of his obesity related diagnoses. Raymond Jordan is on keeping a food journal and adhering to recommended goals of 1400-1700 calories and 100+ grams of protein daily or following a lower carbohydrate, vegetable and lean protein rich diet plan and states he is following his eating plan approximately 60% of the time. Raymond Jordan states he is doing 0 minutes 0 times per week.  Today's visit was #: 24 Starting weight: 292 lbs Starting date: 01/18/2020 Today's weight: 266 lbs Today's date: 01/19/2022 Total lbs lost to date: 26 Total lbs lost since last in-office visit: 2  Interim History: Raymond Jordan has been on vacation  but he still did well with making smarter food choices. He has questions about weight loss medications and insurance coverage.  Subjective:   1. Elevated creatine kinase level Raymond Jordan's creatinine increased with his most recent labs but his BUN is within normal limits.  I discussed labs with the patient today.  2. Pre-diabetes Raymond Jordan's A1c remains stable, but not at goal.  I discussed labs with the patient today.  3. Other depression, emotional eating behaviors Raymond Jordan notes increased cravings and snacking recently.  He heard of Contrave and would like to know if it could help him more.  Assessment/Plan:   1. Elevated creatine kinase level Raymond Jordan will continue to increase his water especially with increased activity outdoors.  2. Pre-diabetes Raymond Jordan will continue with his diet, exercise, and weight loss.  We will recheck labs in 2 to 3 months.  He will continue metformin 500 mg daily with breakfast, and we will refill for 1 month.  - metFORMIN (GLUCOPHAGE) 500 MG tablet; Take 1 tablet (500 mg total) by mouth daily with breakfast.  Dispense: 30 tablet; Refill: 0  3. Other depression, emotional eating behaviors Raymond Jordan deferred medications, emotional eating  strategies were discussed.  We will continue to follow.  4. Obesity with current BMI of 37.1 Raymond Jordan is currently in the action stage of change. As such, his goal is to continue with weight loss efforts. He has agreed to keeping a food journal and adhering to recommended goals of 1400-1700 calories and 100+ grams of protein daily.   Raymond Jordan's insurance will not cover his medications for obesity currenty. We will work on diet and exercise and continue to follow closely.  Behavioral modification strategies: increasing lean protein intake and increasing water intake.  Raymond Jordan has agreed to follow-up with our clinic in 4 weeks. He was informed of the importance of frequent follow-up visits to maximize his success with intensive lifestyle modifications for his multiple health conditions.   Objective:   Blood pressure 128/72, pulse (!) 57, temperature 98 F (36.7 C), height 5\' 11"  (1.803 m), weight 266 lb (120.7 kg), SpO2 100 %. Body mass index is 37.1 kg/m.  General: Cooperative, alert, well developed, in no acute distress. HEENT: Conjunctivae and lids unremarkable. Cardiovascular: Regular rhythm.  Lungs: Normal work of breathing. Neurologic: No focal deficits.   Lab Results  Component Value Date   CREATININE 1.38 (H) 12/22/2021   BUN 18 12/22/2021   NA 144 12/22/2021   K 3.8 12/22/2021   CL 106 12/22/2021   CO2 26 12/22/2021   Lab Results  Component Value Date   ALT 10 12/22/2021   AST 14 12/22/2021   ALKPHOS 64 12/22/2021   BILITOT 0.3 12/22/2021   Lab Results  Component Value Date   HGBA1C 5.8 (H)  12/22/2021   HGBA1C 5.7 (H) 07/02/2021   HGBA1C 5.9 (H) 04/09/2021   HGBA1C 6.1 (H) 11/06/2020   HGBA1C 6.3 06/03/2020   Lab Results  Component Value Date   INSULIN 22.7 12/22/2021   INSULIN 23.8 07/02/2021   INSULIN 14.0 04/09/2021   INSULIN 27.9 (H) 01/18/2020   Lab Results  Component Value Date   TSH 2.040 01/18/2020   Lab Results  Component Value Date   CHOL 143  12/22/2021   HDL 52 12/22/2021   LDLCALC 73 12/22/2021   TRIG 98 12/22/2021   CHOLHDL 3.0 04/09/2021   Lab Results  Component Value Date   VD25OH 48.1 12/22/2021   VD25OH 38.3 07/02/2021   VD25OH 60.5 04/09/2021   Lab Results  Component Value Date   WBC 9.1 04/04/2021   HGB 14.6 04/04/2021   HCT 44.7 04/04/2021   MCV 87.1 04/04/2021   PLT 221 04/04/2021   No results found for: "IRON", "TIBC", "FERRITIN"  Attestation Statements:   Reviewed by clinician on day of visit: allergies, medications, problem list, medical history, surgical history, family history, social history, and previous encounter notes.  Time spent on visit including pre-visit chart review and post-visit care and charting was 44 minutes.   I, Burt Knack, am acting as transcriptionist for Quillian Quince, MD.  I have reviewed the above documentation for accuracy and completeness, and I agree with the above. -  Quillian Quince, MD

## 2022-02-09 ENCOUNTER — Ambulatory Visit (INDEPENDENT_AMBULATORY_CARE_PROVIDER_SITE_OTHER): Payer: 59 | Admitting: Family Medicine

## 2022-02-09 VITALS — BP 123/76 | HR 67 | Temp 98.6°F | Ht 71.0 in | Wt 269.0 lb

## 2022-02-09 DIAGNOSIS — I10 Essential (primary) hypertension: Secondary | ICD-10-CM

## 2022-02-09 DIAGNOSIS — Z6837 Body mass index (BMI) 37.0-37.9, adult: Secondary | ICD-10-CM | POA: Diagnosis not present

## 2022-02-09 DIAGNOSIS — R7303 Prediabetes: Secondary | ICD-10-CM | POA: Diagnosis not present

## 2022-02-09 DIAGNOSIS — E669 Obesity, unspecified: Secondary | ICD-10-CM | POA: Diagnosis not present

## 2022-02-09 MED ORDER — METFORMIN HCL 500 MG PO TABS: 500.0000 mg | ORAL_TABLET | Freq: Every day | ORAL | 1 refills | Status: DC

## 2022-02-11 ENCOUNTER — Encounter (INDEPENDENT_AMBULATORY_CARE_PROVIDER_SITE_OTHER): Payer: Self-pay

## 2022-02-12 ENCOUNTER — Ambulatory Visit (INDEPENDENT_AMBULATORY_CARE_PROVIDER_SITE_OTHER): Payer: 59 | Admitting: Family Medicine

## 2022-02-16 NOTE — Progress Notes (Signed)
Chief Complaint:   OBESITY Raymond Jordan is here to discuss his progress with his obesity treatment plan along with follow-up of his obesity related diagnoses. [name] is on keeping a food journal and adhering to recommended goals of 1400-1700 calories and 100+ grams of protein daily and states he is following his eating plan approximately 65% of the time. [name] states he is walking for 30-45 minutes 2 times per week.  Today's visit was #: 25 Starting weight: 292 lbs Starting date: 01/18/2020 Today's weight: 269 lbs Today's date: 02/09/2022 Total lbs lost to date: 23 Total lbs lost since last in-office visit: 0  Interim History: Raymond Jordan has been traveling and did some celebration eating.  He has increased his water intake and he has worked on Clinical cytogeneticist protein.  His hunger is mostly controlled.  Subjective:   1. Prediabetes Raymond Jordan is stable on his medications, and he denies nausea, vomiting, or hypoglycemia.  2. Benign essential HTN [name]'s blood pressure is well controlled with his medications and with lifestyle changes.  He denies chest pain or lightheadedness.  Assessment/Plan:   1. Prediabetes We will refill metformin for 2 months. Sergi will continue to work on weight loss, exercise, and decreasing simple carbohydrates to help decrease the risk of diabetes.   - metFORMIN (GLUCOPHAGE) 500 MG tablet; Take 1 tablet (500 mg total) by mouth daily with breakfast.  Dispense: 30 tablet; Refill: 1  2. Benign essential HTN [name] will continue his diet, exercise, and weight loss, and we will continue to follow.   3. Obesty, current BMI 37.5 Raymond Jordan is currently in the action stage of change. As such, his goal is to continue with weight loss efforts. He has agreed to the Category 3 Plan (strictly).   Exercise goals: As is.   Behavioral modification strategies: increasing lean protein intake.  [name] has agreed to follow-up with our clinic in 4 weeks. He was informed of the  importance of frequent follow-up visits to maximize his success with intensive lifestyle modifications for his multiple health conditions.   Objective:   Blood pressure 123/76, pulse 67, temperature 98.6 F (37 C), height 5\' 11"  (1.803 m), weight 269 lb (122 kg), SpO2 96 %. Body mass index is 37.52 kg/m.  General: Cooperative, alert, well developed, in no acute distress. HEENT: Conjunctivae and lids unremarkable. Cardiovascular: Regular rhythm.  Lungs: Normal work of breathing. Neurologic: No focal deficits.   Lab Results  Component Value Date   CREATININE 1.38 (H) 12/22/2021   BUN 18 12/22/2021   NA 144 12/22/2021   K 3.8 12/22/2021   CL 106 12/22/2021   CO2 26 12/22/2021   Lab Results  Component Value Date   ALT 10 12/22/2021   AST 14 12/22/2021   ALKPHOS 64 12/22/2021   BILITOT 0.3 12/22/2021   Lab Results  Component Value Date   HGBA1C 5.8 (H) 12/22/2021   HGBA1C 5.7 (H) 07/02/2021   HGBA1C 5.9 (H) 04/09/2021   HGBA1C 6.1 (H) 11/06/2020   HGBA1C 6.3 06/03/2020   Lab Results  Component Value Date   INSULIN 22.7 12/22/2021   INSULIN 23.8 07/02/2021   INSULIN 14.0 04/09/2021   INSULIN 27.9 (H) 01/18/2020   Lab Results  Component Value Date   TSH 2.040 01/18/2020   Lab Results  Component Value Date   CHOL 143 12/22/2021   HDL 52 12/22/2021   LDLCALC 73 12/22/2021   TRIG 98 12/22/2021   CHOLHDL 3.0 04/09/2021   Lab Results  Component Value Date  VD25OH 48.1 12/22/2021   VD25OH 38.3 07/02/2021   VD25OH 60.5 04/09/2021   Lab Results  Component Value Date   WBC 9.1 04/04/2021   HGB 14.6 04/04/2021   HCT 44.7 04/04/2021   MCV 87.1 04/04/2021   PLT 221 04/04/2021   No results found for: "IRON", "TIBC", "FERRITIN"  Attestation Statements:   Reviewed by clinician on day of visit: allergies, medications, problem list, medical history, surgical history, family history, social history, and previous encounter notes.   I, Burt Knack, am acting as  transcriptionist for Quillian Quince, MD.  I have reviewed the above documentation for accuracy and completeness, and I agree with the above. -  Quillian Quince, MD

## 2022-03-02 ENCOUNTER — Other Ambulatory Visit (INDEPENDENT_AMBULATORY_CARE_PROVIDER_SITE_OTHER): Payer: Self-pay | Admitting: Family Medicine

## 2022-03-02 DIAGNOSIS — E559 Vitamin D deficiency, unspecified: Secondary | ICD-10-CM

## 2022-03-12 ENCOUNTER — Ambulatory Visit (INDEPENDENT_AMBULATORY_CARE_PROVIDER_SITE_OTHER): Payer: 59 | Admitting: Family Medicine

## 2022-03-12 ENCOUNTER — Encounter (INDEPENDENT_AMBULATORY_CARE_PROVIDER_SITE_OTHER): Payer: Self-pay | Admitting: Family Medicine

## 2022-03-12 VITALS — BP 148/74 | HR 67 | Temp 98.0°F | Ht 71.0 in | Wt 267.0 lb

## 2022-03-12 DIAGNOSIS — E669 Obesity, unspecified: Secondary | ICD-10-CM

## 2022-03-12 DIAGNOSIS — R7303 Prediabetes: Secondary | ICD-10-CM

## 2022-03-12 DIAGNOSIS — G8929 Other chronic pain: Secondary | ICD-10-CM

## 2022-03-12 DIAGNOSIS — M545 Low back pain, unspecified: Secondary | ICD-10-CM

## 2022-03-12 DIAGNOSIS — E559 Vitamin D deficiency, unspecified: Secondary | ICD-10-CM

## 2022-03-12 DIAGNOSIS — I10 Essential (primary) hypertension: Secondary | ICD-10-CM | POA: Diagnosis not present

## 2022-03-12 DIAGNOSIS — Z6837 Body mass index (BMI) 37.0-37.9, adult: Secondary | ICD-10-CM

## 2022-03-12 MED ORDER — VITAMIN D (ERGOCALCIFEROL) 1.25 MG (50000 UNIT) PO CAPS: ORAL_CAPSULE | ORAL | 0 refills | Status: DC

## 2022-03-12 MED ORDER — METFORMIN HCL 500 MG PO TABS: 500.0000 mg | ORAL_TABLET | Freq: Every day | ORAL | 1 refills | Status: DC

## 2022-03-13 ENCOUNTER — Ambulatory Visit: Payer: 59 | Attending: Cardiology | Admitting: Cardiology

## 2022-03-13 ENCOUNTER — Encounter: Payer: Self-pay | Admitting: Cardiology

## 2022-03-13 VITALS — BP 124/64 | HR 52 | Ht 71.0 in | Wt 293.8 lb

## 2022-03-13 DIAGNOSIS — E7800 Pure hypercholesterolemia, unspecified: Secondary | ICD-10-CM | POA: Diagnosis not present

## 2022-03-13 DIAGNOSIS — G4733 Obstructive sleep apnea (adult) (pediatric): Secondary | ICD-10-CM

## 2022-03-13 DIAGNOSIS — I2510 Atherosclerotic heart disease of native coronary artery without angina pectoris: Secondary | ICD-10-CM

## 2022-03-13 DIAGNOSIS — I10 Essential (primary) hypertension: Secondary | ICD-10-CM | POA: Diagnosis not present

## 2022-03-13 DIAGNOSIS — Z9989 Dependence on other enabling machines and devices: Secondary | ICD-10-CM

## 2022-03-13 MED ORDER — METOPROLOL TARTRATE 50 MG PO TABS: 50.0000 mg | ORAL_TABLET | Freq: Two times a day (BID) | ORAL | 3 refills | Status: DC

## 2022-03-13 MED ORDER — EZETIMIBE 10 MG PO TABS: 10.0000 mg | ORAL_TABLET | Freq: Every day | ORAL | 3 refills | Status: DC

## 2022-03-13 MED ORDER — NITROGLYCERIN 0.4 MG SL SUBL: 0.4000 mg | SUBLINGUAL_TABLET | SUBLINGUAL | 5 refills | Status: DC | PRN

## 2022-03-13 MED ORDER — LOSARTAN POTASSIUM 25 MG PO TABS: 25.0000 mg | ORAL_TABLET | Freq: Every day | ORAL | 3 refills | Status: DC

## 2022-03-13 MED ORDER — PRAVASTATIN SODIUM 80 MG PO TABS: 80.0000 mg | ORAL_TABLET | Freq: Every day | ORAL | 3 refills | Status: DC

## 2022-03-13 MED ORDER — CLOPIDOGREL BISULFATE 75 MG PO TABS: 75.0000 mg | ORAL_TABLET | Freq: Every day | ORAL | 3 refills | Status: DC

## 2022-03-13 NOTE — Addendum Note (Signed)
Addended by: Theresia Majors on: 03/13/2022 09:31 AM   Modules accepted: Orders

## 2022-03-13 NOTE — Progress Notes (Signed)
Cardiology Office Note:    Date:  03/13/2022   ID:  Raymond Jordan., DOB [birth year], MRN [id number]  PCP:  Raymond Smoker, MD  Cardiologist:  Raymond Him, MD    Referring MD: Raymond Jordan, *   Chief Complaint  Patient presents with   Coronary Artery Disease   Hypertension   Hyperlipidemia   Sleep Apnea     History of Present Illness:    Raymond Jordan. is a 61 y.o. male with a hx of HTN, OSA, obesity, ASCAD s/p non-STEMI with cath showing 99% prox RCA s/p DES (now on DAPT) with normal LVF.    He is here today for followup and is doing well.  He denies any chest pain or pressure, SOB, DOE, PND, orthopnea, LE edema, dizziness, palpitations or syncope. He is compliant with his meds and is tolerating meds with no SE.    He is doing well with his PAP device and thinks that he has gotten used to it.  He tolerates the mask and feels the pressure is adequate.  Since going on PAP he feels rested in the am and has no significant daytime sleepiness.  He denies any significant mouth or nasal dryness or nasal congestion.  He does not think that he snores.  He is interested in getting a new PAP device.  He thinks it is >12 years old but will check with his DME.  Past Medical History:  Diagnosis Date   Allergic rhinitis    /asthma   Anxiety    Arthritis    "shoulders, back, knees" (09/16/2015)   Bulging of intervertebral disc between L4 and L5    CAD (coronary artery disease), native coronary artery 09/15/2015   s/p NSTEMI 09/2015 with cath showing 99% RCA s/p DES now on DAPT   Childhood asthma    Chronic back pain    Constipation    ED (erectile dysfunction)    Essential hypertension, benign 12/19/2012   GERD (gastroesophageal reflux disease)    after taking meds , not treated at present   GERD (gastroesophageal reflux disease)    Glaucoma    Health examination of defined subpopulation 12/19/2012   Heart attack ([place])    Hyperlipidemia LDL goal <70    Joint pain     Lower extremity edema    Meningitis hospitalized 07/2009   rule out bells palsy ruled out menigitis   Obesity 06/26/2013   OSA on CPAP 12/19/2012   Osteoarthritis    OA knees, shoulders; DDD lumbar spine   Pure hypercholesterolemia 12/19/2012   Seasonal allergies    SOB (shortness of breath)     Past Surgical History:  Procedure Laterality Date   ANKLE SURGERY  1985   bone spur removal ankle.   CARDIAC CATHETERIZATION N/A 09/16/2015   Procedure: Left Heart Cath and Coronary Angiography;  Surgeon: Raymond Blanks, MD;  Location: [facility];  Service: Cardiovascular;  Laterality: N/A;   CARDIAC CATHETERIZATION N/A 09/16/2015   Procedure: Coronary Stent Intervention;  Surgeon: Raymond Blanks, MD;  Location: [facility];  Service: Cardiovascular;  Laterality: N/A;   MYRINGOTOMY WITH TUBE PLACEMENT Left ~ 07/2009   NASAL SINUS SURGERY  ~ 2007   "& fixed my septum"   SHOULDER OPEN ROTATOR CUFF REPAIR Right 10/2013   TRIGGER FINGER RELEASE Right 07/01/2017   Procedure: RELEASE TRIGGER FINGER/A-1 PULLEY RIGHT THUMB;  Surgeon: Raymond Cover, MD;  Location: [facility];  Service: Orthopedics;  Laterality: Right;   VASECTOMY     WISDOM TOOTH EXTRACTION  ~ 1992    Current Medications: Current Meds  Medication Sig   aspirin EC 81 MG tablet Take 81 mg by mouth daily.   B Complex-Biotin-FA (SUPER B-COMPLEX) TABS Take 1 tablet by mouth daily.   Blood Pressure Monitor KIT Check blood pressure daily prn   cetirizine (ZYRTEC) 10 MG tablet Take 10 mg by mouth daily as needed for allergies.    chlorthalidone (HYGROTON) 25 MG tablet TAKE 1 TABLET BY MOUTH  DAILY   diclofenac Sodium (VOLTAREN) 1 % GEL Apply 2 g topically daily as needed (for pain).   fluticasone (FLONASE) 50 MCG/ACT nasal spray Place into both nostrils as needed for allergies or rhinitis.   latanoprost (XALATAN) 0.005 % ophthalmic solution Place 1 drop into both eyes at bedtime.   metFORMIN  (GLUCOPHAGE) 500 MG tablet Take 1 tablet (500 mg total) by mouth daily with breakfast.   pantoprazole (PROTONIX) 40 MG tablet Take 40 mg by mouth daily.   timolol (TIMOPTIC-XR) 0.5 % ophthalmic gel-forming    Vitamin D, Ergocalciferol, (DRISDOL) 1.25 MG (50000 UNIT) CAPS capsule One Cap every 7 days   [DISCONTINUED] clopidogrel (PLAVIX) 75 MG tablet TAKE 1 TABLET BY MOUTH DAILY   [DISCONTINUED] ezetimibe (ZETIA) 10 MG tablet TAKE 1 TABLET BY MOUTH  DAILY   [DISCONTINUED] losartan (COZAAR) 25 MG tablet TAKE 1 TABLET BY MOUTH  DAILY   [DISCONTINUED] metoprolol tartrate (LOPRESSOR) 50 MG tablet TAKE 1 TABLET BY MOUTH  TWICE DAILY   [DISCONTINUED] nitroGLYCERIN (NITROSTAT) 0.4 MG SL tablet Place 1 tablet (0.4 mg total) under the tongue every 5 (five) minutes as needed for chest pain.   [DISCONTINUED] pravastatin (PRAVACHOL) 80 MG tablet TAKE 1 TABLET BY MOUTH  DAILY     Allergies:   Atorvastatin, Lisinopril, and Rosuvastatin   Social History   Socioeconomic History   Marital status: Married    Spouse name: Not on file   Number of children: 2   Years of education: Not on file   Highest education level: Not on file  Occupational History   Occupation: Truck driver  Tobacco Use   Smoking status: Former   Smokeless tobacco: Never   Tobacco comments:    "smoked 3 packs of cigaretes in my whole life"  Vaping Use   Vaping Use: Never used  Substance and Sexual Activity   Alcohol use: Yes    Comment: 09/16/2015 "might have a couple drinks q couple months"   Drug use: No   Sexual activity: Yes  Other Topics Concern   Not on file  Social History Narrative   Marital status: Married x 30 years      Children:  2 children; 1 stepchild; grandchild and two step grandchildren.      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      Tobacco:  No tobacco      Alcohol: + weekends sometimes; two drinks per month.      Drugs:  none      Exercise:  Walking and cycling.   Social Determinants of Health   Financial Resource Strain: Not on file  Food Insecurity: Not on file  Transportation Needs: Not on file  Physical Activity: Not on file  Stress: Not on file  Social Connections: Not on file     Family History: The patient's family history includes Arthritis in his father; Asthma in his  mother; Cancer in his father; Depression in his mother; Diabetes in his mother; Heart attack in his father; Heart disease in his father; Hypertension in his father and mother; Obesity in his mother; Sarcoidosis in his mother; Stroke in his father and mother.  ROS:   Please see the history of present illness.    ROS  All other systems reviewed and negative.   EKGs/Labs/Other Studies Reviewed:    The following studies were reviewed today: PAP compliance download, EKG  EKG:  EKG is  ordered today.  The ekg ordered today demonstrates Sinus bradycardia at 52bpm with no ST changes  Recent Labs: 04/04/2021: Hemoglobin 14.6; Platelets 221 12/22/2021: ALT 10; BUN 18; Creatinine, Ser 1.38; Potassium 3.8; Sodium 144   Recent Lipid Panel    Component Value Date/Time   CHOL 143 12/22/2021 0820   TRIG 98 12/22/2021 0820   HDL 52 12/22/2021 0820   CHOLHDL 3.0 04/09/2021 1149   CHOLHDL 2.7 05/04/2016 0736   VLDL 14 05/04/2016 0736   LDLCALC 73 12/22/2021 0820    Physical Exam:    VS:  BP 124/64   Pulse (!) 52   Ht _0  (1.803 m)   Wt 293 lb 12.8 oz (133.3 kg)   BMI 40.98 kg/m     Wt Readings from Last 3 Encounters:  03/13/22 293 lb 12.8 oz (133.3 kg)  03/12/22 267 lb (121.1 kg)  02/09/22 269 lb (122 kg)    GEN: Well nourished, well developed in no acute distress HEENT: Normal NECK: No JVD; No carotid bruits LYMPHATICS: No lymphadenopathy CARDIAC:RRR, no murmurs, rubs, gallops RESPIRATORY:  Clear to auscultation without rales, wheezing or rhonchi  ABDOMEN: Soft, non-tender, non-distended MUSCULOSKELETAL:  No  edema; No deformity  SKIN: Warm and dry NEUROLOGIC:  Alert and oriented x 3 PSYCHIATRIC:  Normal affect  ASSESSMENT:    1. OSA on CPAP   2. Essential hypertension, benign   3. Coronary artery disease involving native coronary artery of native heart without angina pectoris   4. Pure hypercholesterolemia    PLAN:    In order of problems listed above:  1.  OSA - The patient is tolerating PAP therapy well without any problems. The PAP download performed by his DME was personally reviewed and interpreted by me today and showed an AHI of 0.6/hr on 11 cm H2O with 93% compliance in using more than 4 hours nightly.  The patient has been using and benefiting from PAP use and will continue to benefit from therapy.     2.  Hypertension  -BP is adequately controlled on exam today -Continue prescription drug management with chlorthalidone 25 mg daily, losartan 25 mg daily, Lopressor 50 mg twice daily with as needed refills -I have personally reviewed and interpreted outside labs performed by patient's PCP which showed serum creatinine 1.38 and potassium 3.8 on 12/22/2021  3.  ASCAD  -s/p NSTEMI with cath showing 99% pRCA s/p DES in 09/2015.  Carlton Adam myoview 01/2020 showed no ischemia -He has not had any anginal symptoms since I saw Jordan last -Continue drug management with aspirin 81 mg daily, Plavix 75 mg daily, Lopressor 50 mg twice daily with as needed refills  4.  Hyperlipidemia  -his LDLgoal is < 70 -I have personally reviewed and interpreted outside labs performed by patient's PCP which showed LDL 73 and HDL 52 on 12/22/2021 -Continue prescription drug management with pravastatin 80 mg daily and Zetia 10 mg daily with as needed refills -I have asked Jordan to be better  about his diet>>repeat FLP and ALT in 3 months and if LDL not at goal will refer to lipid clinic for [id number]  Medication Adjustments/Labs and Tests Ordered: Current medicines are reviewed at length with the patient today.  Concerns  regarding medicines are outlined above.  Orders Placed This Encounter  Procedures   EKG 12-Lead   Meds ordered this encounter  Medications   clopidogrel (PLAVIX) 75 MG tablet    Sig: Take 1 tablet (75 mg total) by mouth daily.    Dispense:  90 tablet    Refill:  3    Please call our office to schedule an yearly appointment with Dr. Radford Pax for July 2023 before anymore refills. [phone]. Thank you 1st attempt   ezetimibe (ZETIA) 10 MG tablet    Sig: Take 1 tablet (10 mg total) by mouth daily.    Dispense:  90 tablet    Refill:  3    Please call our office to schedule an yearly appointment with Dr. Radford Pax for July 2023 before anymore refills. [phone]. Thank you 1st attempt   losartan (COZAAR) 25 MG tablet    Sig: Take 1 tablet (25 mg total) by mouth daily.    Dispense:  90 tablet    Refill:  3    Please call our office to schedule an yearly appointment with Dr. Radford Pax for July 2023 before anymore refills. [phone]. Thank you 1st attempt   metoprolol tartrate (LOPRESSOR) 50 MG tablet    Sig: Take 1 tablet (50 mg total) by mouth 2 (two) times daily.    Dispense:  180 tablet    Refill:  3    Please call our office to schedule an yearly appointment with Dr. Radford Pax for July 2023 before anymore refills. [phone]. Thank you 1st attempt   nitroGLYCERIN (NITROSTAT) 0.4 MG SL tablet    Sig: Place 1 tablet (0.4 mg total) under the tongue every 5 (five) minutes as needed for chest pain.    Dispense:  25 tablet    Refill:  5   pravastatin (PRAVACHOL) 80 MG tablet    Sig: Take 1 tablet (80 mg total) by mouth daily.    Dispense:  90 tablet    Refill:  3    Please call our office to schedule an yearly appointment with Dr. Radford Pax for July 2023 before anymore refills. [phone]. Thank you 1st attempt    Signed, Raymond Him, MD  03/13/2022 9:21 AM    [place]

## 2022-03-13 NOTE — Patient Instructions (Signed)
Medication Instructions:  Your physician recommends that you continue on your current medications as directed. Please refer to the Current Medication list given to you today.  *If you need a refill on your cardiac medications before your next appointment, please call your pharmacy*  Lab Work: Fasting lipids and ALT in 3 months  If you have labs (blood work) drawn today and your tests are completely normal, you will receive your results only by: MyChart Message (if you have MyChart) OR A paper copy in the mail If you have any lab test that is abnormal or we need to change your treatment, we will call you to review the results.  Testing/Procedures: Your physician has requested that you have an echocardiogram in April 2024. Echocardiography is a painless test that uses sound waves to create images of your heart. It provides your doctor with information about the size and shape of your heart and how well your heart's chambers and valves are working. This procedure takes approximately one hour. There are no restrictions for this procedure.  Follow-Up: At [facility], you and your health needs are our priority.  As part of our continuing mission to provide you with exceptional heart care, we have created designated Provider Care Teams.  These Care Teams include your primary Cardiologist (physician) and Advanced Practice Providers (APPs -  Physician Assistants and Nurse Practitioners) who all work together to provide you with the care you need, when you need it.  Your next appointment:   1 year(s)  The format for your next appointment:   In Person  Provider:   Armanda Magic, MD      Important Information About Sugar

## 2022-03-18 NOTE — Progress Notes (Unsigned)
Chief Complaint:   OBESITY Raymond Jordan is here to discuss his progress with his obesity treatment plan along with follow-up of his obesity related diagnoses. Raymond Jordan is on the Category 3 Plan and states he is following his eating plan approximately 75% of the time. Raymond Jordan states he is walking for 30 minutes 3 times per week.  Today's visit was #: 26 Starting weight: 292 lbs Starting date: 01/18/2020 Today's weight: 267 lbs Today's date: 03/12/2022 Total lbs lost to date: 25 Total lbs lost since last in-office visit: 2  Interim History: Raymond Jordan reports good adherence to reduced calorie plan.  He has occasional cravings with good coping strategies.  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  His back and hip are affecting duration and frequency.  He notes occasional upset stomach on metformin.  Subjective:   1. Prediabetes Raymond Jordan has improved on his medical nutrition therapy and metformin.  He is having occasional upset stomach but not severe.  This may be related to food intake.  2. Essential hypertension Raymond Jordan's blood pressure is elevated today but his previous readings have been at goal.  He has good adherence to his medications.  3. Chronic low back pain, unspecified back pain laterality, unspecified whether sciatica present Raymond Jordan complains of lower back pain.  4. Vitamin D deficiency Raymond Jordan's vitamin D level has improved on vitamin D supplementation.  Assessment/Plan:   1. Prediabetes Reviewed food modification.  Raymond Jordan will continue metformin 500 mg once daily with breakfast, and we will refill for 2 months.  - metFORMIN (GLUCOPHAGE) 500 MG tablet; Take 1 tablet (500 mg total) by mouth daily with breakfast.  Dispense: 30 tablet; Refill: 1  2. Essential hypertension Raymond Jordan is to monitor at home for the goal of a blood pressure below 130/80.  He will continue his current medications.  3. Chronic low back pain, unspecified back pain laterality, unspecified whether sciatica  present Reviewed home conditioning program from [facility].  Raymond Jordan may take Tylenol as he has relative contraindications to NSAIDs.  4. Vitamin D deficiency Raymond Jordan will continue high-dose vitamin D for goal of 50-60.  We will recheck vitamin D levels at his future office visit.  We will refill prescription vitamin D 50,000 units once weekly for 1 month.  - Vitamin D, Ergocalciferol, (DRISDOL) 1.25 MG (50000 UNIT) CAPS capsule; One Cap every 7 days  Dispense: 5 capsule; Refill: 0  5. Obesity, current BMI 37.2 Raymond Jordan is currently in the action stage of change. As such, his goal is to continue with weight loss efforts. He has agreed to the Category 3 Plan.   Exercise goals: As is.   Behavioral modification strategies: emotional eating strategies.  Raymond Jordan has agreed to follow-up with our clinic in 5 weeks. He was informed of the importance of frequent follow-up visits to maximize his success with intensive lifestyle modifications for his multiple health conditions.   Objective:   Blood pressure (!) 148/74, pulse 67, temperature 98 F (36.7 C), height 5\' 11"  (1.803 m), weight 267 lb (121.1 kg), SpO2 98 %. Body mass index is 37.24 kg/m.  General: Cooperative, alert, well developed, in no acute distress. HEENT: Conjunctivae and lids unremarkable. Cardiovascular: Regular rhythm.  Lungs: Normal work of breathing. Neurologic: No focal deficits.   Lab Results  Component Value Date   CREATININE 1.38 (H) 12/22/2021   BUN 18 12/22/2021   NA 144 12/22/2021   K 3.8 12/22/2021   CL 106 12/22/2021   CO2 26 12/22/2021   Lab Results  Component Value Date   ALT 10 12/22/2021   AST 14 12/22/2021   ALKPHOS 64 12/22/2021   BILITOT 0.3 12/22/2021   Lab Results  Component Value Date   HGBA1C 5.8 (H) 12/22/2021   HGBA1C 5.7 (H) 07/02/2021   HGBA1C 5.9 (H) 04/09/2021   HGBA1C 6.1 (H) 11/06/2020   HGBA1C 6.3 06/03/2020   Lab Results  Component Value Date   INSULIN 22.7 12/22/2021   INSULIN  23.8 07/02/2021   INSULIN 14.0 04/09/2021   INSULIN 27.9 (H) 01/18/2020   Lab Results  Component Value Date   TSH 2.040 01/18/2020   Lab Results  Component Value Date   CHOL 143 12/22/2021   HDL 52 12/22/2021   LDLCALC 73 12/22/2021   TRIG 98 12/22/2021   CHOLHDL 3.0 04/09/2021   Lab Results  Component Value Date   VD25OH 48.1 12/22/2021   VD25OH 38.3 07/02/2021   VD25OH 60.5 04/09/2021   Lab Results  Component Value Date   WBC 9.1 04/04/2021   HGB 14.6 04/04/2021   HCT 44.7 04/04/2021   MCV 87.1 04/04/2021   PLT 221 04/04/2021   No results found for: "IRON", "TIBC", "FERRITIN"  Attestation Statements:   Reviewed by clinician on day of visit: allergies, medications, problem list, medical history, surgical history, family history, social history, and previous encounter notes.   I, Burt Knack, am acting as transcriptionist for Quillian Quince, MD.  I have reviewed the above documentation for accuracy and completeness, and I agree with the above. -  Quillian Quince, MD

## 2022-03-24 ENCOUNTER — Encounter: Payer: Self-pay | Admitting: Cardiology

## 2022-03-24 MED ORDER — NITROGLYCERIN 0.4 MG SL SUBL: 0.4000 mg | SUBLINGUAL_TABLET | SUBLINGUAL | 5 refills | Status: AC | PRN

## 2022-03-24 NOTE — Telephone Encounter (Signed)
Spoke with patient who would like his nitro prescription sent to [facility] instead of [facility]. He also states that he does not need 90 tablets at once. Prescription has been sent to patient's preferred pharmacy with 25 tablets.

## 2022-03-29 ENCOUNTER — Other Ambulatory Visit: Payer: Self-pay | Admitting: Cardiology

## 2022-03-29 ENCOUNTER — Encounter: Payer: Self-pay | Admitting: Cardiology

## 2022-04-14 ENCOUNTER — Ambulatory Visit (INDEPENDENT_AMBULATORY_CARE_PROVIDER_SITE_OTHER): Payer: 59 | Admitting: Family Medicine

## 2022-04-14 ENCOUNTER — Encounter (INDEPENDENT_AMBULATORY_CARE_PROVIDER_SITE_OTHER): Payer: Self-pay | Admitting: Family Medicine

## 2022-04-14 VITALS — BP 128/73 | HR 60 | Temp 98.5°F | Ht 71.0 in | Wt 269.0 lb

## 2022-04-14 DIAGNOSIS — E669 Obesity, unspecified: Secondary | ICD-10-CM | POA: Diagnosis not present

## 2022-04-14 DIAGNOSIS — E559 Vitamin D deficiency, unspecified: Secondary | ICD-10-CM

## 2022-04-14 DIAGNOSIS — Z6837 Body mass index (BMI) 37.0-37.9, adult: Secondary | ICD-10-CM | POA: Diagnosis not present

## 2022-04-14 DIAGNOSIS — R7303 Prediabetes: Secondary | ICD-10-CM

## 2022-04-14 DIAGNOSIS — Z6841 Body Mass Index (BMI) 40.0 and over, adult: Secondary | ICD-10-CM

## 2022-04-14 MED ORDER — VITAMIN D (ERGOCALCIFEROL) 1.25 MG (50000 UNIT) PO CAPS: ORAL_CAPSULE | ORAL | 0 refills | Status: DC

## 2022-04-14 MED ORDER — METFORMIN HCL 500 MG PO TABS: 500.0000 mg | ORAL_TABLET | Freq: Every day | ORAL | 0 refills | Status: DC

## 2022-04-16 NOTE — Progress Notes (Signed)
Chief Complaint:   OBESITY Raymond Jordan is here to discuss his progress with his obesity treatment plan along with follow-up of his obesity related diagnoses. Raymond Jordan is on the Category 3 Plan and states he is following his eating plan approximately 50% of the time. Raymond Jordan states he is walking 30 minutes 13 times per week.  Today's visit was #: 76 Starting weight: 292 lbs Starting date: 01/18/2020 Today's weight: 269 lbs Today's date: 04/14/2022 Total lbs lost to date: 23 lbs Total lbs lost since last in-office visit: 0  Interim History: Raymond Jordan feels he has been "slipping" a bit off plan. Meal preps when he can. 2 boiled eggs with 1 string cheese. Lunch was yogurt, [place] sandwich with 2 slices of [place] and cheese. Supper Is whatever wife cooks. He had not recognized he had not been getting enough protein in daily.  Subjective:   1. Prediabetes Daily's last A1c 5.8. Denies GI side effects.A1c improved from 63.  2. Vitamin D deficiency Raymond Jordan is currently taking prescription Vit D 50,000 IU once a week. Denies any nausea, vomiting or muscle weakness. Last Vit D level of 48.1 on June 2023.  Assessment/Plan:   1. Prediabetes We will refill Metformin 500 mg daily for 1 month with 0 refills.  -Refill metFORMIN (GLUCOPHAGE) 500 MG tablet; Take 1 tablet (500 mg total) by mouth daily with breakfast.  Dispense: 90 tablet; Refill: 0  2. Vitamin D deficiency We will refill Vit D 50k IU once weekly for 1 month with 0 refills.  -Refill Vitamin D, Ergocalciferol, (DRISDOL) 1.25 MG (50000 UNIT) CAPS capsule; One Cap every 7 days  Dispense: 5 capsule; Refill: 0  3. Obesity, current BMI 37.5 Raymond Jordan is currently in the action stage of change. As such, his goal is to continue with weight loss efforts. He has agreed to the Category 3 Plan.   Exercise goals: All adults should avoid inactivity. Some physical activity is better than none, and adults who participate in any amount of physical activity  gain some health benefits.  Behavioral modification strategies: increasing lean protein intake, meal planning and cooking strategies, keeping healthy foods in the home, and planning for success.  Raymond Jordan has agreed to follow-up with our clinic in 4 weeks. He was informed of the importance of frequent follow-up visits to maximize his success with intensive lifestyle modifications for his multiple health conditions.   Objective:   Blood pressure 128/73, pulse 60, temperature 98.5 F (36.9 C), height 5\' 11"  (1.803 m), weight 269 lb (122 kg), SpO2 98 %. Body mass index is 37.52 kg/m.  General: Cooperative, alert, well developed, in no acute distress. HEENT: Conjunctivae and lids unremarkable. Cardiovascular: Regular rhythm.  Lungs: Normal work of breathing. Neurologic: No focal deficits.   Lab Results  Component Value Date   CREATININE 1.38 (H) 12/22/2021   BUN 18 12/22/2021   NA 144 12/22/2021   K 3.8 12/22/2021   CL 106 12/22/2021   CO2 26 12/22/2021   Lab Results  Component Value Date   ALT 10 12/22/2021   AST 14 12/22/2021   ALKPHOS 64 12/22/2021   BILITOT 0.3 12/22/2021   Lab Results  Component Value Date   HGBA1C 5.8 (H) 12/22/2021   HGBA1C 5.7 (H) 07/02/2021   HGBA1C 5.9 (H) 04/09/2021   HGBA1C 6.1 (H) 11/06/2020   HGBA1C 6.3 06/03/2020   Lab Results  Component Value Date   INSULIN 22.7 12/22/2021   INSULIN 23.8 07/02/2021   INSULIN 14.0 04/09/2021   INSULIN  27.9 (H) 01/18/2020   Lab Results  Component Value Date   TSH 2.040 01/18/2020   Lab Results  Component Value Date   CHOL 143 12/22/2021   HDL 52 12/22/2021   LDLCALC 73 12/22/2021   TRIG 98 12/22/2021   CHOLHDL 3.0 04/09/2021   Lab Results  Component Value Date   VD25OH 48.1 12/22/2021   VD25OH 38.3 07/02/2021   VD25OH 60.5 04/09/2021   Lab Results  Component Value Date   WBC 9.1 04/04/2021   HGB 14.6 04/04/2021   HCT 44.7 04/04/2021   MCV 87.1 04/04/2021   PLT 221 04/04/2021   No  results found for: "IRON", "TIBC", "FERRITIN"  Attestation Statements:   Reviewed by clinician on day of visit: allergies, medications, problem list, medical history, surgical history, family history, social history, and previous encounter notes.  I, Elnora Morrison, RMA am acting as transcriptionist for Coralie Common, MD.  I have reviewed the above documentation for accuracy and completeness, and I agree with the above. - Coralie Common, MD

## 2022-05-14 ENCOUNTER — Encounter (INDEPENDENT_AMBULATORY_CARE_PROVIDER_SITE_OTHER): Payer: Self-pay | Admitting: Family Medicine

## 2022-05-14 ENCOUNTER — Ambulatory Visit (INDEPENDENT_AMBULATORY_CARE_PROVIDER_SITE_OTHER): Payer: 59 | Admitting: Family Medicine

## 2022-05-14 VITALS — BP 134/78 | HR 62 | Temp 98.8°F | Ht 71.0 in | Wt 272.8 lb

## 2022-05-14 DIAGNOSIS — Z6838 Body mass index (BMI) 38.0-38.9, adult: Secondary | ICD-10-CM | POA: Diagnosis not present

## 2022-05-14 DIAGNOSIS — I10 Essential (primary) hypertension: Secondary | ICD-10-CM | POA: Diagnosis not present

## 2022-05-14 DIAGNOSIS — E669 Obesity, unspecified: Secondary | ICD-10-CM | POA: Diagnosis not present

## 2022-05-14 DIAGNOSIS — R7303 Prediabetes: Secondary | ICD-10-CM | POA: Diagnosis not present

## 2022-05-14 MED ORDER — METFORMIN HCL 500 MG PO TABS: 500.0000 mg | ORAL_TABLET | Freq: Two times a day (BID) | ORAL | 0 refills | Status: DC

## 2022-06-01 NOTE — Progress Notes (Signed)
Chief Complaint:   OBESITY Raymond Jordan is here to discuss his progress with his obesity treatment plan along with follow-up of his obesity related diagnoses. Tyreik is on the Category 3 Plan and states he is following his eating plan approximately 65% of the time. Rakim states he is doing 0 minutes 0 times per week.  Today's visit was #: 28 Starting weight: 292 lbs Starting date: 01/18/2020 Today's weight: 272 lbs Today's date: 05/14/2022 Total lbs lost to date: 20 Total lbs lost since last in-office visit: 0  Interim History: Lige has maintained his weight loss. He has been traveling and on vacation over the past few weeks, and he has a trip to [place] soon. He asked about Berberine and Tumeric. We discussed that this may be of more benefits and concerns for safety with Berberine. We discussed trying to meet protein needs.   Subjective:   1. Prediabetes Raymond Jordan is taking metformin 500 mg daily, and he is working on increasing his protein. He denies side effects with metformin.   2. Essential hypertension Raymond Jordan is taking Hygroton, Cozaar, and Lopressor with no side effects noted. His blood pressure is near goal.   Assessment/Plan:   1. Prediabetes Raymond Jordan agreed to increase metformin to 500 mg BID, and we will refill for 1 month. He will continue with his diet and exercise.   - metFORMIN (GLUCOPHAGE) 500 MG tablet; Take 1 tablet (500 mg total) by mouth 2 (two) times daily with a meal.  Dispense: 60 tablet; Refill: 0  2. Essential hypertension [name] will continue his medications, eating plan, and exercise.   3. Obesity, current BMI 38.0 Raymond Jordan is currently in the action stage of change. As such, his goal is to continue with weight loss efforts. He has agreed to the Category 3 Plan.   Exercise goals: All adults should avoid inactivity. Some physical activity is better than none, and adults who participate in any amount of physical activity gain some health  benefits.  Behavioral modification strategies: increasing lean protein intake, decreasing simple carbohydrates, travel eating strategies, holiday eating strategies , and celebration eating strategies.  Raymond Jordan has agreed to follow-up with our clinic in 6 weeks. He was informed of the importance of frequent follow-up visits to maximize his success with intensive lifestyle modifications for his multiple health conditions.   Objective:   Blood pressure 134/78, pulse 62, temperature 98.8 F (37.1 C), height 5\' 11"  (1.803 m), weight 272 lb 12.8 oz (123.7 kg), SpO2 100 %. Body mass index is 38.05 kg/m.  General: Cooperative, alert, well developed, in no acute distress. HEENT: Conjunctivae and lids unremarkable. Cardiovascular: Regular rhythm.  Lungs: Normal work of breathing. Neurologic: No focal deficits.   Lab Results  Component Value Date   CREATININE 1.38 (H) 12/22/2021   BUN 18 12/22/2021   NA 144 12/22/2021   K 3.8 12/22/2021   CL 106 12/22/2021   CO2 26 12/22/2021   Lab Results  Component Value Date   ALT 10 12/22/2021   AST 14 12/22/2021   ALKPHOS 64 12/22/2021   BILITOT 0.3 12/22/2021   Lab Results  Component Value Date   HGBA1C 5.8 (H) 12/22/2021   HGBA1C 5.7 (H) 07/02/2021   HGBA1C 5.9 (H) 04/09/2021   HGBA1C 6.1 (H) 11/06/2020   HGBA1C 6.3 06/03/2020   Lab Results  Component Value Date   INSULIN 22.7 12/22/2021   INSULIN 23.8 07/02/2021   INSULIN 14.0 04/09/2021   INSULIN 27.9 (H) 01/18/2020   Lab Results  Component  Value Date   TSH 2.040 01/18/2020   Lab Results  Component Value Date   CHOL 143 12/22/2021   HDL 52 12/22/2021   LDLCALC 73 12/22/2021   TRIG 98 12/22/2021   CHOLHDL 3.0 04/09/2021   Lab Results  Component Value Date   VD25OH 48.1 12/22/2021   VD25OH 38.3 07/02/2021   VD25OH 60.5 04/09/2021   Lab Results  Component Value Date   WBC 9.1 04/04/2021   HGB 14.6 04/04/2021   HCT 44.7 04/04/2021   MCV 87.1 04/04/2021   PLT 221  04/04/2021   No results found for: "IRON", "TIBC", "FERRITIN"  Attestation Statements:   Reviewed by clinician on day of visit: allergies, medications, problem list, medical history, surgical history, family history, social history, and previous encounter notes.   I, Burt Knack, am acting as transcriptionist for Quillian Quince, MD.  I have reviewed the above documentation for accuracy and completeness, and I agree with the above. -  Quillian Quince, MD

## 2022-06-22 ENCOUNTER — Ambulatory Visit (INDEPENDENT_AMBULATORY_CARE_PROVIDER_SITE_OTHER): Payer: 59 | Admitting: Family Medicine

## 2022-06-22 ENCOUNTER — Encounter (INDEPENDENT_AMBULATORY_CARE_PROVIDER_SITE_OTHER): Payer: Self-pay | Admitting: Family Medicine

## 2022-06-22 VITALS — BP 135/78 | HR 59 | Temp 98.1°F | Ht 71.0 in | Wt 271.0 lb

## 2022-06-22 DIAGNOSIS — R7303 Prediabetes: Secondary | ICD-10-CM

## 2022-06-22 DIAGNOSIS — Z6837 Body mass index (BMI) 37.0-37.9, adult: Secondary | ICD-10-CM

## 2022-06-22 DIAGNOSIS — E559 Vitamin D deficiency, unspecified: Secondary | ICD-10-CM

## 2022-06-22 DIAGNOSIS — E669 Obesity, unspecified: Secondary | ICD-10-CM

## 2022-06-22 MED ORDER — VITAMIN D (ERGOCALCIFEROL) 1.25 MG (50000 UNIT) PO CAPS: ORAL_CAPSULE | ORAL | 0 refills | Status: DC

## 2022-06-22 MED ORDER — METFORMIN HCL 500 MG PO TABS: 500.0000 mg | ORAL_TABLET | Freq: Two times a day (BID) | ORAL | 0 refills | Status: DC

## 2022-06-24 ENCOUNTER — Ambulatory Visit (INDEPENDENT_AMBULATORY_CARE_PROVIDER_SITE_OTHER): Payer: 59 | Admitting: Family Medicine

## 2022-06-26 ENCOUNTER — Other Ambulatory Visit: Payer: 59

## 2022-06-30 ENCOUNTER — Ambulatory Visit: Payer: 59 | Attending: Cardiology

## 2022-06-30 DIAGNOSIS — I10 Essential (primary) hypertension: Secondary | ICD-10-CM

## 2022-06-30 DIAGNOSIS — G4733 Obstructive sleep apnea (adult) (pediatric): Secondary | ICD-10-CM

## 2022-06-30 DIAGNOSIS — I2510 Atherosclerotic heart disease of native coronary artery without angina pectoris: Secondary | ICD-10-CM

## 2022-06-30 DIAGNOSIS — E7800 Pure hypercholesterolemia, unspecified: Secondary | ICD-10-CM

## 2022-07-01 LAB — LIPID PANEL
Chol/HDL Ratio: 2.6 ratio (ref 0.0–5.0)
Cholesterol, Total: 151 mg/dL (ref 100–199)
HDL: 59 mg/dL (ref >39)
LDL Chol Calc (NIH): 74 mg/dL (ref 0–99)
Triglycerides: 98 mg/dL (ref 0–149)
VLDL Cholesterol Cal: 18 mg/dL (ref 5–40)

## 2022-07-01 LAB — ALT: ALT: 10 IU/L (ref 0–44)

## 2022-07-02 ENCOUNTER — Telehealth: Payer: Self-pay

## 2022-07-02 DIAGNOSIS — E7800 Pure hypercholesterolemia, unspecified: Secondary | ICD-10-CM

## 2022-07-02 MED ORDER — NEXLIZET 180-10 MG PO TABS: 1.0000 | ORAL_TABLET | Freq: Every day | ORAL | 3 refills | Status: DC

## 2022-07-02 NOTE — Progress Notes (Signed)
Chief Complaint:   OBESITY Raymond Jordan is here to discuss his progress with his obesity treatment plan along with follow-up of his obesity related diagnoses. Raymond Jordan is on the Category 3 Plan and states he is following his eating plan approximately 65% of the time. Frandy states he is doing 0 minutes 0 times per week.  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 He has done a lot of traveling and vacations.   Subjective:   1. Prediabetes Ruble has multiple questions about diabetes mellitus, pre-diabetes, metformin, and GLP-1 and their mechanism of action.   2. Vitamin D deficiency Lain is on Vitamin D, and he denies nausea, vomiting, or muscle weakness.   Assessment/Plan:   1. Prediabetes We will refill metformin for 1 month. All of Eric's questions were answered. The goal is to prevent diabetes mellitus.   - metFORMIN (GLUCOPHAGE) 500 MG tablet; Take 1 tablet (500 mg total) by mouth 2 (two) times daily with a meal.  Dispense: 60 tablet; Refill: 0  2. Vitamin D deficiency We will refill prescription Vitamin D, and we will recheck his labs in 1 month.   - Vitamin D, Ergocalciferol, (DRISDOL) 1.25 MG (50000 UNIT) CAPS capsule; One Cap every 7 days  Dispense: 5 capsule; Refill: 0  3. Obesity, Current BMI 37.9 Raymond Jordan is currently in the action stage of change. As such, his goal is to continue with weight loss efforts. He has agreed to the Category 3 Plan.   Behavioral modification strategies: increasing lean protein intake, travel eating strategies, and holiday eating strategies .  Raymond Jordan has agreed to follow-up with our clinic in 4 weeks. He was informed of the importance of frequent  follow-up visits to maximize his success with intensive lifestyle modifications for his multiple health conditions.   Objective:   Blood pressure 135/78, pulse (!) 59, temperature 98.1 F (36.7 C), height 5\' 11"  (1.803 m), weight 271 lb (122.9 kg), SpO2 98 %. Body mass index is 37.8 kg/m.  General: Cooperative, alert, well developed, in no acute distress. HEENT: Conjunctivae and lids unremarkable. Cardiovascular: Regular rhythm.  Lungs: Normal work of breathing. Neurologic: No focal deficits.   Lab Results  Component Value Date   CREATININE 1.38 (H) 12/22/2021   BUN 18 12/22/2021   NA 144 12/22/2021   K 3.8 12/22/2021   CL 106 12/22/2021   CO2 26 12/22/2021   Lab Results  Component Value Date   ALT 10 06/30/2022   AST 14 12/22/2021   ALKPHOS 64 12/22/2021   BILITOT 0.3 12/22/2021   Lab Results  Component Value Date   HGBA1C 5.8 (H) 12/22/2021   HGBA1C 5.7 (H) 07/02/2021   HGBA1C 5.9 (H) 04/09/2021   HGBA1C 6.1 (H) 11/06/2020   HGBA1C 6.3 06/03/2020   Lab Results  Component Value Date   INSULIN 22.7 12/22/2021   INSULIN 23.8 07/02/2021   INSULIN 14.0 04/09/2021   INSULIN 27.9 (H) 01/18/2020   Lab Results  Component Value Date   TSH 2.040 01/18/2020   Lab Results  Component Value Date   CHOL 151 06/30/2022   HDL 59 06/30/2022   LDLCALC 74 06/30/2022   TRIG 98 06/30/2022   CHOLHDL 2.6 06/30/2022   Lab Results  Component Value Date   VD25OH 48.1 12/22/2021   VD25OH 38.3 07/02/2021   VD25OH 60.5 04/09/2021   Lab Results  Component Value Date   WBC 9.1 04/04/2021   HGB 14.6 04/04/2021   HCT 44.7 04/04/2021   MCV 87.1 04/04/2021   PLT 221 04/04/2021   No results found for: "IRON", "TIBC", "FERRITIN"  Attestation Statements:   Reviewed by clinician on day of visit: allergies, medications, problem list, medical history, surgical history, family history, social history, and previous encounter notes.  Time spent on visit including pre-visit chart  review and post-visit care and charting was 40 minutes.   I, Burt Knack, am acting as transcriptionist for Quillian Quince, MD.  I have reviewed the above documentation for accuracy and completeness, and I agree with the above. -  Quillian Quince, MD

## 2022-07-02 NOTE — Telephone Encounter (Signed)
The patient has been notified of the result and verbalized understanding.  All questions (if any) were answered. Frutoso Schatz, RN 07/02/2022 3:50 PM   Patient is agreeable to switching from zetia to nexlizet. Labs have been ordered.

## 2022-07-02 NOTE — Telephone Encounter (Signed)
-----   Message from Awilda Metro, RPH-CPP sent at 07/02/2022  1:48 PM EST ----- Prior auth approved through 07/03/23, would change his ezetimibe to Nexlizet and continue his pravastatin, and recheck lipids in 2 months. He can sign up for a copay card online by either googling Nexlizet copay card or going to the website [email or web address]

## 2022-07-03 LAB — BASIC METABOLIC PANEL
BUN: 20 (ref 4–21)
CO2: 30 — AB (ref 13–22)
Chloride: 105 (ref 99–108)
Creatinine: 1.3 (ref 0.6–1.3)
Glucose: 74
Potassium: 4.1 mEq/L (ref 3.5–5.1)
Sodium: 140 (ref 137–147)

## 2022-07-03 LAB — HEPATIC FUNCTION PANEL
ALT: 12 U/L (ref 10–40)
AST: 16 (ref 14–40)

## 2022-07-03 LAB — COMPREHENSIVE METABOLIC PANEL
Albumin: 4.1 (ref 3.5–5.0)
Calcium: 9.6 (ref 8.7–10.7)
eGFR: 64

## 2022-07-03 LAB — VITAMIN D 25 HYDROXY (VIT D DEFICIENCY, FRACTURES): Vit D, 25-Hydroxy: 46.3

## 2022-07-03 LAB — PSA: PSA: 3.94

## 2022-07-03 LAB — HEMOGLOBIN A1C: Hemoglobin A1C: 6

## 2022-07-16 ENCOUNTER — Encounter (INDEPENDENT_AMBULATORY_CARE_PROVIDER_SITE_OTHER): Payer: Self-pay | Admitting: Family Medicine

## 2022-07-27 ENCOUNTER — Ambulatory Visit (INDEPENDENT_AMBULATORY_CARE_PROVIDER_SITE_OTHER): Payer: 59 | Admitting: Physician Assistant

## 2022-07-27 ENCOUNTER — Encounter (INDEPENDENT_AMBULATORY_CARE_PROVIDER_SITE_OTHER): Payer: Self-pay | Admitting: Physician Assistant

## 2022-07-27 VITALS — BP 150/91 | HR 57 | Temp 98.0°F | Ht 71.0 in

## 2022-07-27 DIAGNOSIS — E559 Vitamin D deficiency, unspecified: Secondary | ICD-10-CM

## 2022-07-27 DIAGNOSIS — E669 Obesity, unspecified: Secondary | ICD-10-CM | POA: Diagnosis not present

## 2022-07-27 DIAGNOSIS — Z6838 Body mass index (BMI) 38.0-38.9, adult: Secondary | ICD-10-CM

## 2022-07-27 DIAGNOSIS — R7303 Prediabetes: Secondary | ICD-10-CM

## 2022-07-27 MED ORDER — METFORMIN HCL 500 MG PO TABS: 500.0000 mg | ORAL_TABLET | Freq: Two times a day (BID) | ORAL | 0 refills | Status: DC

## 2022-07-27 MED ORDER — VITAMIN D (ERGOCALCIFEROL) 1.25 MG (50000 UNIT) PO CAPS: ORAL_CAPSULE | ORAL | 0 refills | Status: DC

## 2022-07-28 ENCOUNTER — Ambulatory Visit (INDEPENDENT_AMBULATORY_CARE_PROVIDER_SITE_OTHER): Payer: 59 | Admitting: Family Medicine

## 2022-08-04 NOTE — Progress Notes (Unsigned)
Chief Complaint:   OBESITY Raymond Jordan is here to discuss his progress with his obesity treatment plan along with follow-up of his obesity related diagnoses. Raymond Jordan is on the Category 3 Plan and states he is following his eating plan approximately 65% of the time. Raymond Jordan states he is doing therapy 30 minutes 3 times per week.  Today's visit was #: 30 Starting weight: 292 lbs Starting date: 01/18/2020 Today's weight: 275 lbs Today's date: 07/27/2022 Total lbs lost to date: 17 lbs Total lbs lost since last in-office visit: 0  Interim History: Raymond Jordan has done well with weight loss overall. He did struggle some to stay on his nutrition plan over the holidays. He also has recent travel to [place] and ate off plan during his travels. He is feeling somewhat bored with his category 3 plan and we discussed changing to journaling. 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  He is working on better meal planning and prepping strategies. He has done well on Mounjaro in the past but has no insurance coverage for GLP-1 medications at this time. We discussed switching to journaling with calorie goal of 1500-1600 and protein goal of 100 or more grams daily.  Subjective:   1. Prediabetes Labs discussed during visit today.  On Metformin 500 mg twice a day.  Denies any side effects.  A1c 6.0 on 07/03/22-slightly worse from previous at 5.8 on 12/22/2021.  He is working on decreasing simple carbs, increasing lean protein, exercise to promote weight loss/improved glycemic control/prevent progression to type 2 diabetes.  2. Vitamin D deficiency Level of 46.3 on 07/03/22-not at goal.  Taking ergocalciferol 50,000 IU once weekly.  Denies any side effects.  Assessment/Plan:   1. Prediabetes Continue/refill Metformin 500 mg twice a day for 1 month with 0 refills.  Continue Prescribed Nutrition Plan and exercise to promote weight loss/improved glycemic control/prevent progression to type 2 diabetes.  -Refill  metFORMIN (GLUCOPHAGE) 500 MG tablet; Take 1 tablet (500 mg total) by mouth 2 (two) times daily with a meal.  Dispense: 60 tablet; Refill: 0  2. Vitamin D deficiency We will refill ergocalciferol once a week for 1 month with 0 refills.  -Refill Vitamin D, Ergocalciferol, (DRISDOL) 1.25 MG (50000 UNIT) CAPS capsule; One Cap every 7 days  Dispense: 5 capsule; Refill: 0  3. Obesity, Current BMI 38.4 Raymond Jordan is currently in the action stage of change. As such, his goal is to continue with weight loss efforts. He has agreed to the Category 3 Plan and keeping a food journal and adhering to recommended goals of 1500-1600 calories and 100+ grams of protein daily.   Exercise goals: As is.  Behavioral modification strategies: increasing lean protein intake, decreasing simple carbohydrates, meal planning and cooking strategies, and keeping a strict food journal.  Raymond Jordan has agreed to follow-up with our clinic in 4 weeks. He was informed of the importance of frequent follow-up visits to maximize his success with intensive lifestyle modifications for his multiple health conditions.   Objective:   Blood pressure (!) 150/91, pulse (!) 57, temperature 98 F (36.7 C), height 5\' 11"  (1.803 m), SpO2 97 %. Body mass index is 37.8 kg/m.  General: Cooperative, alert, well developed, in no acute distress. HEENT: Conjunctivae and lids unremarkable. Cardiovascular: Regular rhythm.  Lungs: Normal work of breathing. Neurologic: No focal deficits.   Lab Results  Component Value Date   CREATININE 1.3 07/03/2022   BUN 20 07/03/2022   NA 140 07/03/2022   K 4.1  07/03/2022   CL 105 07/03/2022   CO2 30 (A) 07/03/2022   Lab Results  Component Value Date   ALT 12 07/03/2022   AST 16 07/03/2022   ALKPHOS 64 12/22/2021   BILITOT 0.3 12/22/2021   Lab Results  Component Value Date   HGBA1C 6.0 07/03/2022   HGBA1C 5.8 (H) 12/22/2021   HGBA1C 5.7 (H) 07/02/2021   HGBA1C 5.9 (H) 04/09/2021   HGBA1C 6.1 (H)  11/06/2020   Lab Results  Component Value Date   INSULIN 22.7 12/22/2021   INSULIN 23.8 07/02/2021   INSULIN 14.0 04/09/2021   INSULIN 27.9 (H) 01/18/2020   Lab Results  Component Value Date   TSH 2.040 01/18/2020   Lab Results  Component Value Date   CHOL 151 06/30/2022   HDL 59 06/30/2022   LDLCALC 74 06/30/2022   TRIG 98 06/30/2022   CHOLHDL 2.6 06/30/2022   Lab Results  Component Value Date   VD25OH 46.3 07/03/2022   VD25OH 48.1 12/22/2021   VD25OH 38.3 07/02/2021   Lab Results  Component Value Date   WBC 9.1 04/04/2021   HGB 14.6 04/04/2021   HCT 44.7 04/04/2021   MCV 87.1 04/04/2021   PLT 221 04/04/2021   No results found for: "IRON", "TIBC", "FERRITIN"  Attestation Statements:   Reviewed by clinician on day of visit: allergies, medications, problem list, medical history, surgical history, family history, social history, and previous encounter notes.  I, Brendell Tyus, am acting as transcriptionist for [employer], PA.  I have reviewed the above documentation for accuracy and completeness, and I agree with the above. -  Malorie Bigford,PA-C

## 2022-08-09 ENCOUNTER — Encounter (INDEPENDENT_AMBULATORY_CARE_PROVIDER_SITE_OTHER): Payer: Self-pay | Admitting: Family Medicine

## 2022-08-24 ENCOUNTER — Other Ambulatory Visit (INDEPENDENT_AMBULATORY_CARE_PROVIDER_SITE_OTHER): Payer: Self-pay | Admitting: Physician Assistant

## 2022-08-24 DIAGNOSIS — R7303 Prediabetes: Secondary | ICD-10-CM

## 2022-08-25 ENCOUNTER — Ambulatory Visit (INDEPENDENT_AMBULATORY_CARE_PROVIDER_SITE_OTHER): Payer: 59 | Admitting: Family Medicine

## 2022-08-25 ENCOUNTER — Encounter (INDEPENDENT_AMBULATORY_CARE_PROVIDER_SITE_OTHER): Payer: Self-pay | Admitting: Family Medicine

## 2022-08-25 VITALS — BP 151/86 | HR 60 | Temp 98.1°F | Ht 71.0 in | Wt 271.0 lb

## 2022-08-25 DIAGNOSIS — E559 Vitamin D deficiency, unspecified: Secondary | ICD-10-CM | POA: Diagnosis not present

## 2022-08-25 DIAGNOSIS — R7303 Prediabetes: Secondary | ICD-10-CM

## 2022-08-25 DIAGNOSIS — E669 Obesity, unspecified: Secondary | ICD-10-CM | POA: Diagnosis not present

## 2022-08-25 DIAGNOSIS — Z6837 Body mass index (BMI) 37.0-37.9, adult: Secondary | ICD-10-CM | POA: Diagnosis not present

## 2022-08-25 MED ORDER — METFORMIN HCL 500 MG PO TABS: 500.0000 mg | ORAL_TABLET | Freq: Two times a day (BID) | ORAL | 0 refills | Status: DC

## 2022-08-25 MED ORDER — VITAMIN D (ERGOCALCIFEROL) 1.25 MG (50000 UNIT) PO CAPS: ORAL_CAPSULE | ORAL | 0 refills | Status: DC

## 2022-08-26 ENCOUNTER — Ambulatory Visit (INDEPENDENT_AMBULATORY_CARE_PROVIDER_SITE_OTHER): Payer: 59 | Admitting: Family Medicine

## 2022-09-07 NOTE — Progress Notes (Unsigned)
Chief Complaint:   OBESITY Raymond Jordan is here to discuss his progress with his obesity treatment plan along with follow-up of his obesity related diagnoses. Raymond Jordan is on the Category 3 Plan or keeping a food journal and adhering to recommended goals of 1500-1600 calories and 100+ grams of protein daily and states he is following his eating plan approximately 80% of the time. Raymond Jordan states he is doing 0 minutes 0 times per week.  Today's visit was #: 37 Starting weight: 292 lbs Starting date: 01/18/2020 Today's weight: 271 lbs Today's date: 08/25/2022 Total lbs lost to date: 21 Total lbs lost since last in-office visit: 4  Interim History: Raymond Jordan has been working on his diet and trying to decrease simple carbohydrates.  He is doing better with decreasing his portions.  His hunger is mostly controlled.  Subjective:   1. Prediabetes Raymond Jordan's A1c has significantly increased to 6.0.  He is on metformin 2 pills a day, but he has been taking just 1 pill currently.  I discussed labs with the patient today.  2. Vitamin D deficiency Raymond Jordan's vitamin D level is almost at goal with no side effects noted.  He has questions on why his vitamin D level is low.  I discussed labs with the patient today.  Assessment/Plan:   1. Prediabetes Raymond Jordan agreed to increase metformin back to 2 tablets daily, and we will refill for 1 month.  - metFORMIN (GLUCOPHAGE) 500 MG tablet; Take 1 tablet (500 mg total) by mouth 2 (two) times daily with a meal.  Dispense: 60 tablet; Refill: 0  2. Vitamin D deficiency Raymond Jordan will continue prescription vitamin D, and we will refill for 90 days.  All questions were answered.  - Vitamin D, Ergocalciferol, (DRISDOL) 1.25 MG (50000 UNIT) CAPS capsule; One Cap every 7 days  Dispense: 15 capsule; Refill: 0  3. BMI 37.0-37.9, adult  4. Obesity, Beginning BMI 40.73 Raymond Jordan is currently in the action stage of change. As such, his goal is to continue with weight loss efforts. He  has agreed to the Category 3 Plan.   Exercise goals: All adults should avoid inactivity. Some physical activity is better than none, and adults who participate in any amount of physical activity gain some health benefits.  Behavioral modification strategies: increasing lean protein intake.  Raymond Jordan has agreed to follow-up with our clinic in 3 to 4 weeks. He was informed of the importance of frequent follow-up visits to maximize his success with intensive lifestyle modifications for his multiple health conditions.   Objective:   Blood pressure (!) 151/86, pulse 60, temperature 98.1 F (36.7 C), height '5\' 11"'$  (1.803 m), weight 271 lb (122.9 kg), SpO2 97 %. Body mass index is 37.8 kg/m.  Lab Results  Component Value Date   CREATININE 1.3 07/03/2022   BUN 20 07/03/2022   NA 140 07/03/2022   K 4.1 07/03/2022   CL 105 07/03/2022   CO2 30 (A) 07/03/2022   Lab Results  Component Value Date   ALT 12 07/03/2022   AST 16 07/03/2022   ALKPHOS 64 12/22/2021   BILITOT 0.3 12/22/2021   Lab Results  Component Value Date   HGBA1C 6.0 07/03/2022   HGBA1C 5.8 (H) 12/22/2021   HGBA1C 5.7 (H) 07/02/2021   HGBA1C 5.9 (H) 04/09/2021   HGBA1C 6.1 (H) 11/06/2020   Lab Results  Component Value Date   INSULIN 22.7 12/22/2021   INSULIN 23.8 07/02/2021   INSULIN 14.0 04/09/2021   INSULIN 27.9 (H) 01/18/2020  Lab Results  Component Value Date   TSH 2.040 01/18/2020   Lab Results  Component Value Date   CHOL 151 06/30/2022   HDL 59 06/30/2022   LDLCALC 74 06/30/2022   TRIG 98 06/30/2022   CHOLHDL 2.6 06/30/2022   Lab Results  Component Value Date   VD25OH 46.3 07/03/2022   VD25OH 48.1 12/22/2021   VD25OH 38.3 07/02/2021   Lab Results  Component Value Date   WBC 9.1 04/04/2021   HGB 14.6 04/04/2021   HCT 44.7 04/04/2021   MCV 87.1 04/04/2021   PLT 221 04/04/2021   No results found for: "IRON", "TIBC", "FERRITIN"  Attestation Statements:   Reviewed by clinician on day of  visit: allergies, medications, problem list, medical history, surgical history, family history, social history, and previous encounter notes.   I, Trixie Dredge, am acting as transcriptionist for Dennard Nip, MD.  I have reviewed the above documentation for accuracy and completeness, and I agree with the above. -  Dennard Nip, MD

## 2022-09-22 ENCOUNTER — Ambulatory Visit (INDEPENDENT_AMBULATORY_CARE_PROVIDER_SITE_OTHER): Payer: 59 | Admitting: Family Medicine

## 2022-09-22 ENCOUNTER — Encounter (INDEPENDENT_AMBULATORY_CARE_PROVIDER_SITE_OTHER): Payer: Self-pay | Admitting: Family Medicine

## 2022-09-22 VITALS — BP 130/73 | HR 58 | Temp 98.2°F | Ht 71.0 in | Wt 273.0 lb

## 2022-09-22 DIAGNOSIS — E669 Obesity, unspecified: Secondary | ICD-10-CM

## 2022-09-22 DIAGNOSIS — Z6838 Body mass index (BMI) 38.0-38.9, adult: Secondary | ICD-10-CM | POA: Diagnosis not present

## 2022-09-22 DIAGNOSIS — R7303 Prediabetes: Secondary | ICD-10-CM

## 2022-09-22 DIAGNOSIS — E559 Vitamin D deficiency, unspecified: Secondary | ICD-10-CM | POA: Diagnosis not present

## 2022-09-22 MED ORDER — METFORMIN HCL 500 MG PO TABS: 500.0000 mg | ORAL_TABLET | Freq: Two times a day (BID) | ORAL | 0 refills | Status: DC

## 2022-09-22 MED ORDER — VITAMIN D (ERGOCALCIFEROL) 1.25 MG (50000 UNIT) PO CAPS: ORAL_CAPSULE | ORAL | 0 refills | Status: DC

## 2022-09-23 ENCOUNTER — Ambulatory Visit (INDEPENDENT_AMBULATORY_CARE_PROVIDER_SITE_OTHER): Payer: 59 | Admitting: Family Medicine

## 2022-09-28 NOTE — Progress Notes (Unsigned)
Chief Complaint:   OBESITY Raymond Jordan is here to discuss his progress with his obesity treatment plan along with follow-up of his obesity related diagnoses. Raymond Jordan is on the Category 3 Plan and states he is following his eating plan approximately 75% of the time. Raymond Jordan states he is doing 0 minutes 0 times per week.  Today's visit was #: 70 Starting weight: 292 lbs Starting date: 01/18/2020 Today's weight: 273 lbs Today's date: 09/22/2022 Total lbs lost to date: 19 Total lbs lost since last in-office visit: 0  Interim History: Raymond Jordan is mostly maintaining his weight.  He has been working on increasing his protein intake.  He has questions about a variety of foods and what is considered healthy for him.  Subjective:   1. Vitamin D deficiency Raymond Jordan is on vitamin D with no side effects noted.  He needs a refill today.  2. Prediabetes Raymond Jordan is on metformin, and he is doing better with remembering to take both doses.  He denies nausea or vomiting.  Assessment/Plan:   1. Vitamin D deficiency Raymond Jordan will continue prescription vitamin D, and we will refill for 4 months.  - Vitamin D, Ergocalciferol, (DRISDOL) 1.25 MG (50000 UNIT) CAPS capsule; One Cap every 7 days  Dispense: 15 capsule; Refill: 0  2. Prediabetes Raymond Jordan will continue metformin twice daily, and we will refill for 1 month.  We will continue to follow.  - metFORMIN (GLUCOPHAGE) 500 MG tablet; Take 1 tablet (500 mg total) by mouth 2 (two) times daily with a meal.  Dispense: 60 tablet; Refill: 0  3. BMI 38.0-38.9,adult  4. Obesity, Beginning BMI 40.73 Raymond Jordan is currently in the action stage of change. As such, his goal is to continue with weight loss efforts. He has agreed to the Category 3 Plan.   We discussed various sources of protein and the importance of eating more "real" food.   Exercise goals: Increase strengthening exercise.   Behavioral modification strategies: increasing lean protein intake.  Raymond Jordan has  agreed to follow-up with our clinic in 4 weeks. He was informed of the importance of frequent follow-up visits to maximize his success with intensive lifestyle modifications for his multiple health conditions.   Objective:   Blood pressure 130/73, pulse (!) 58, temperature 98.2 F (36.8 C), height 5\' 11"  (1.803 m), weight 273 lb (123.8 kg), SpO2 98 %. Body mass index is 38.08 kg/m.  Lab Results  Component Value Date   CREATININE 1.3 07/03/2022   BUN 20 07/03/2022   NA 140 07/03/2022   K 4.1 07/03/2022   CL 105 07/03/2022   CO2 30 (A) 07/03/2022   Lab Results  Component Value Date   ALT 12 07/03/2022   AST 16 07/03/2022   ALKPHOS 64 12/22/2021   BILITOT 0.3 12/22/2021   Lab Results  Component Value Date   HGBA1C 6.0 07/03/2022   HGBA1C 5.8 (H) 12/22/2021   HGBA1C 5.7 (H) 07/02/2021   HGBA1C 5.9 (H) 04/09/2021   HGBA1C 6.1 (H) 11/06/2020   Lab Results  Component Value Date   INSULIN 22.7 12/22/2021   INSULIN 23.8 07/02/2021   INSULIN 14.0 04/09/2021   INSULIN 27.9 (H) 01/18/2020   Lab Results  Component Value Date   TSH 2.040 01/18/2020   Lab Results  Component Value Date   CHOL 151 06/30/2022   HDL 59 06/30/2022   LDLCALC 74 06/30/2022   TRIG 98 06/30/2022   CHOLHDL 2.6 06/30/2022   Lab Results  Component Value Date   VD25OH  46.3 07/03/2022   VD25OH 48.1 12/22/2021   VD25OH 38.3 07/02/2021   Lab Results  Component Value Date   WBC 9.1 04/04/2021   HGB 14.6 04/04/2021   HCT 44.7 04/04/2021   MCV 87.1 04/04/2021   PLT 221 04/04/2021   No results found for: "IRON", "TIBC", "FERRITIN"  Attestation Statements:   Reviewed by clinician on day of visit: allergies, medications, problem list, medical history, surgical history, family history, social history, and previous encounter notes.   I, Trixie Dredge, am acting as transcriptionist for Dennard Nip, MD.  I have reviewed the above documentation for accuracy and completeness, and I agree with the  above. -  Dennard Nip, MD

## 2022-10-12 ENCOUNTER — Other Ambulatory Visit (HOSPITAL_COMMUNITY): Payer: 59

## 2022-10-13 ENCOUNTER — Ambulatory Visit (HOSPITAL_COMMUNITY): Payer: 59 | Attending: Cardiology

## 2022-10-13 DIAGNOSIS — I10 Essential (primary) hypertension: Secondary | ICD-10-CM | POA: Diagnosis present

## 2022-10-13 DIAGNOSIS — E7800 Pure hypercholesterolemia, unspecified: Secondary | ICD-10-CM

## 2022-10-13 DIAGNOSIS — G4733 Obstructive sleep apnea (adult) (pediatric): Secondary | ICD-10-CM | POA: Diagnosis present

## 2022-10-13 DIAGNOSIS — I2510 Atherosclerotic heart disease of native coronary artery without angina pectoris: Secondary | ICD-10-CM | POA: Diagnosis present

## 2022-10-13 LAB — ECHOCARDIOGRAM COMPLETE
Area-P 1/2: 2.83 cm2
S' Lateral: 3.3 cm

## 2022-10-14 ENCOUNTER — Telehealth: Payer: Self-pay

## 2022-10-14 NOTE — Telephone Encounter (Signed)
-----   Message from Quintella Reichert, MD sent at 10/13/2022 11:24 AM EDT ----- 2D echo showed normal heart function EF 60 to 65% with mildly enlarged left atrium otherwise normal echo

## 2022-10-14 NOTE — Telephone Encounter (Signed)
Spoke with patient about his echo results. Patient verbalizes understanding that EF is normal, slight enlargement of left atrium, otherwise normal echo.

## 2022-10-20 ENCOUNTER — Encounter (INDEPENDENT_AMBULATORY_CARE_PROVIDER_SITE_OTHER): Payer: Self-pay | Admitting: Family Medicine

## 2022-10-20 ENCOUNTER — Ambulatory Visit (INDEPENDENT_AMBULATORY_CARE_PROVIDER_SITE_OTHER): Payer: 59 | Admitting: Family Medicine

## 2022-10-20 VITALS — BP 137/77 | HR 64 | Temp 98.0°F | Ht 71.0 in | Wt 275.0 lb

## 2022-10-20 DIAGNOSIS — E559 Vitamin D deficiency, unspecified: Secondary | ICD-10-CM

## 2022-10-20 DIAGNOSIS — E669 Obesity, unspecified: Secondary | ICD-10-CM

## 2022-10-20 DIAGNOSIS — R7303 Prediabetes: Secondary | ICD-10-CM | POA: Diagnosis not present

## 2022-10-20 DIAGNOSIS — Z6838 Body mass index (BMI) 38.0-38.9, adult: Secondary | ICD-10-CM

## 2022-10-20 MED ORDER — METFORMIN HCL 500 MG PO TABS: 500.0000 mg | ORAL_TABLET | Freq: Two times a day (BID) | ORAL | 0 refills | Status: DC

## 2022-10-20 MED ORDER — VITAMIN D (ERGOCALCIFEROL) 1.25 MG (50000 UNIT) PO CAPS: ORAL_CAPSULE | ORAL | 0 refills | Status: DC

## 2022-10-21 NOTE — Progress Notes (Unsigned)
Chief Complaint:   OBESITY Raymond Jordan is here to discuss his progress with his obesity treatment plan along with follow-up of his obesity related diagnoses. Raymond Jordan is on the Category 3 Plan and states he is following his eating plan approximately 75-80% of the time. Raymond Jordan states he is walking various times per week.    Today's visit was #: 33 Starting weight: 292 lbs Starting date: 01/18/2020 Today's weight: 275 lbs Today's date: 10/20/2022 Total lbs lost to date: 17 Total lbs lost since last in-office visit: 0  Interim History: Raymond Jordan is retaining some fluid today. His back pain is worse this Spring, but he is still trying to stay active. He is trying to eat all of the food on his plan but he is getting tired of his breakfast options.   Subjective:   1. Prediabetes Raymond Jordan continues to work on his diet, exercise, and weight loss. He is tolerating metformin well with no GI issues.   2. Vitamin D deficiency Raymond Jordan is on prescription Vitamin D, and his recent level was below goal. He denies nausea, vomiting, or muscle weakness.   Assessment/Plan:   1. Prediabetes Raymond Jordan will continue metformin 500 mg BID, and we will refill for 1 month.   - metFORMIN (GLUCOPHAGE) 500 MG tablet; Take 1 tablet (500 mg total) by mouth 2 (two) times daily with a meal.  Dispense: 60 tablet; Refill: 0  2. Vitamin D deficiency Raymond Jordan will continue prescription Vitamin D, and we will refill for 90 days.   - Vitamin D, Ergocalciferol, (DRISDOL) 1.25 MG (50000 UNIT) CAPS capsule; One Cap every 7 days  Dispense: 15 capsule; Refill: 0  3. BMI 38.0-38.9,adult  4. Obesity, Beginning BMI 40.73 Raymond Jordan is currently in the action stage of change. As such, his goal is to continue with weight loss efforts. He has agreed to the Category 3 Plan and keeping a food journal and adhering to recommended goals of 300-450 calories and 30+ grams of protein at breakfast daily.   Exercise goals: As is.   Behavioral  modification strategies: increasing lean protein intake.  Raymond Jordan has agreed to follow-up with our clinic in 4 weeks. He was informed of the importance of frequent follow-up visits to maximize his success with intensive lifestyle modifications for his multiple health conditions.   Objective:   Blood pressure 137/77, pulse 64, temperature 98 F (36.7 C), height  (1.803 m), weight 275 lb (124.7 kg), SpO2 95 %. Body mass index is 38.35 kg/m.  Lab Results  Component Value Date   CREATININE 1.3 07/03/2022   BUN 20 07/03/2022   NA 140 07/03/2022   K 4.1 07/03/2022   CL 105 07/03/2022   CO2 30 (A) 07/03/2022   Lab Results  Component Value Date   ALT 12 07/03/2022   AST 16 07/03/2022   ALKPHOS 64 12/22/2021   BILITOT 0.3 12/22/2021   Lab Results  Component Value Date   HGBA1C 6.0 07/03/2022   HGBA1C 5.8 (H) 12/22/2021   HGBA1C 5.7 (H) 07/02/2021   HGBA1C 5.9 (H) 04/09/2021   HGBA1C 6.1 (H) 11/06/2020   Lab Results  Component Value Date   INSULIN 22.7 12/22/2021   INSULIN 23.8 07/02/2021   INSULIN 14.0 04/09/2021   INSULIN 27.9 (H) 01/18/2020   Lab Results  Component Value Date   TSH 2.040 01/18/2020   Lab Results  Component Value Date   CHOL 151 06/30/2022   HDL 59 06/30/2022   LDLCALC 74 06/30/2022   TRIG 98 06/30/2022  CHOLHDL 2.6 06/30/2022   Lab Results  Component Value Date   VD25OH 46.3 07/03/2022   VD25OH 48.1 12/22/2021   VD25OH 38.3 07/02/2021   Lab Results  Component Value Date   WBC 9.1 04/04/2021   HGB 14.6 04/04/2021   HCT 44.7 04/04/2021   MCV 87.1 04/04/2021   PLT 221 04/04/2021   No results found for: "IRON", "TIBC", "FERRITIN"  Attestation Statements:   Reviewed by clinician on day of visit: allergies, medications, problem list, medical history, surgical history, family history, social history, and previous encounter notes.   I, Burt Knack, am acting as transcriptionist for Quillian Quince, MD.  I have reviewed the above  documentation for accuracy and completeness, and I agree with the above. -  Quillian Quince, MD

## 2022-11-16 ENCOUNTER — Telehealth: Payer: Self-pay | Admitting: Cardiology

## 2022-11-16 NOTE — Telephone Encounter (Signed)
Patient is calling back to see if his CPAP report is ready to be picked up

## 2022-11-16 NOTE — Telephone Encounter (Signed)
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 He is requesting a callback letting him know when it's ready for pickup. Please advise.

## 2022-11-16 NOTE — Telephone Encounter (Signed)
Called and spoke to patient about what documentation he was needing, patient states he was already to get compliance reports from his DME company for his cpap. He states he has no further needs.

## 2022-11-25 ENCOUNTER — Ambulatory Visit (INDEPENDENT_AMBULATORY_CARE_PROVIDER_SITE_OTHER): Payer: 59 | Admitting: Family Medicine

## 2022-11-25 ENCOUNTER — Encounter (INDEPENDENT_AMBULATORY_CARE_PROVIDER_SITE_OTHER): Payer: Self-pay | Admitting: Family Medicine

## 2022-11-25 VITALS — BP 126/72 | HR 71 | Temp 98.6°F | Ht 71.0 in | Wt 270.0 lb

## 2022-11-25 DIAGNOSIS — N189 Chronic kidney disease, unspecified: Secondary | ICD-10-CM

## 2022-11-25 DIAGNOSIS — I10 Essential (primary) hypertension: Secondary | ICD-10-CM

## 2022-11-25 DIAGNOSIS — R7303 Prediabetes: Secondary | ICD-10-CM | POA: Diagnosis not present

## 2022-11-25 DIAGNOSIS — E559 Vitamin D deficiency, unspecified: Secondary | ICD-10-CM | POA: Diagnosis not present

## 2022-11-25 DIAGNOSIS — E669 Obesity, unspecified: Secondary | ICD-10-CM

## 2022-11-25 DIAGNOSIS — Z6837 Body mass index (BMI) 37.0-37.9, adult: Secondary | ICD-10-CM

## 2022-11-25 MED ORDER — METFORMIN HCL 500 MG PO TABS
500.0000 mg | ORAL_TABLET | Freq: Two times a day (BID) | ORAL | 0 refills | Status: DC
Start: 2022-11-25 — End: 2022-12-30

## 2022-11-25 MED ORDER — VITAMIN D (ERGOCALCIFEROL) 1.25 MG (50000 UNIT) PO CAPS: ORAL_CAPSULE | ORAL | 0 refills | Status: DC

## 2022-12-01 NOTE — Progress Notes (Signed)
Chief Complaint:   OBESITY Raymond Jordan is here to discuss his progress with his obesity treatment plan along with follow-up of his obesity related diagnoses. Raymond Jordan is on the Category 3 Plan and keeping a food journal and adhering to recommended goals of 300-450 calories and 30+ grams of protein at breakfast daily and states he is following his eating plan approximately 80% of the time. Raymond Jordan states he is walking.  Today's visit was #: 34 Starting weight: 292 lbs Starting date: 01/18/2020 Today's weight: 270 lbs Today's date: 11/25/2022 Total lbs lost to date: 22 Total lbs lost since last in-office visit: 5  Interim History: Patient continues to do well with his weight loss.  He has worked on increasing vegetables and fruit.  He is more active with daily walking and increased yard work.  Subjective:   1. Vitamin D deficiency Patient is on vitamin D prescription, and his level is not yet at goal but improving.  2. Prediabetes Patient is working on decreasing simple carbohydrates and working on weight loss.  He denies nausea or vomiting with metformin.  3. Essential hypertension Patient's initial blood pressure was elevated but at goal with second check.  He is trying to control his blood pressure with his diet as well.  4. Chronic renal impairment, unspecified CKD stage Patient has questions about lab results especially regarding his renal function.  He notes his creatinine increases when his BUN is elevated.  I discussed labs with the patient today.  Assessment/Plan:   1. Vitamin D deficiency Patient will continue prescription vitamin D, we will refill for 90 days.  - Vitamin D, Ergocalciferol, (DRISDOL) 1.25 MG (50000 UNIT) CAPS capsule; One Cap every 7 days  Dispense: 15 capsule; Refill: 0  2. Prediabetes Patient will continue metformin, we will refill for 1 month.  - metFORMIN (GLUCOPHAGE) 500 MG tablet; Take 1 tablet (500 mg total) by mouth 2 (two) times daily with a  meal.  Dispense: 60 tablet; Refill: 0  3. Essential hypertension Patient will continue his medications and diet, and we will continue to monitor and watch for hypotension with continued weight loss.  4. Chronic renal impairment, unspecified CKD stage Labs were reviewed from his company and compared to his previous results.  His CRI was explained and he will work on increasing his water intake and continue to get tight blood pressure control.  5. BMI 37.0-37.9, adult  6. Obesity, Beginning BMI 40.73 Raymond Jordan is currently in the action stage of change. As such, his goal is to continue with weight loss efforts. He has agreed to the Category 3 Plan.   Exercise goals: All adults should avoid inactivity. Some physical activity is better than none, and adults who participate in any amount of physical activity gain some health benefits. Try to monitor daily step average.  Behavioral modification strategies: increasing lean protein intake.  Raymond Jordan has agreed to follow-up with our clinic in 4 weeks. He was informed of the importance of frequent follow-up visits to maximize his success with intensive lifestyle modifications for his multiple health conditions.   Objective:   Blood pressure 126/72, pulse 71, temperature 98.6 F (37 C), height 5\' 11"  (1.803 m), weight 270 lb (122.5 kg), SpO2 96 %. Body mass index is 37.66 kg/m.  Lab Results  Component Value Date   CREATININE 1.3 07/03/2022   BUN 20 07/03/2022   NA 140 07/03/2022   K 4.1 07/03/2022   CL 105 07/03/2022   CO2 30 (A) 07/03/2022  Lab Results  Component Value Date   ALT 12 07/03/2022   AST 16 07/03/2022   ALKPHOS 64 12/22/2021   BILITOT 0.3 12/22/2021   Lab Results  Component Value Date   HGBA1C 6.0 07/03/2022   HGBA1C 5.8 (H) 12/22/2021   HGBA1C 5.7 (H) 07/02/2021   HGBA1C 5.9 (H) 04/09/2021   HGBA1C 6.1 (H) 11/06/2020   Lab Results  Component Value Date   INSULIN 22.7 12/22/2021   INSULIN 23.8 07/02/2021   INSULIN  14.0 04/09/2021   INSULIN 27.9 (H) 01/18/2020   Lab Results  Component Value Date   TSH 2.040 01/18/2020   Lab Results  Component Value Date   CHOL 151 06/30/2022   HDL 59 06/30/2022   LDLCALC 74 06/30/2022   TRIG 98 06/30/2022   CHOLHDL 2.6 06/30/2022   Lab Results  Component Value Date   VD25OH 46.3 07/03/2022   VD25OH 48.1 12/22/2021   VD25OH 38.3 07/02/2021   Lab Results  Component Value Date   WBC 9.1 04/04/2021   HGB 14.6 04/04/2021   HCT 44.7 04/04/2021   MCV 87.1 04/04/2021   PLT 221 04/04/2021   No results found for: "IRON", "TIBC", "FERRITIN"  Attestation Statements:   Reviewed by clinician on day of visit: allergies, medications, problem list, medical history, surgical history, family history, social history, and previous encounter notes.  Time spent on visit including pre-visit chart review and post-visit care and charting was 40 minutes.   I, Burt Knack, am acting as transcriptionist for Quillian Quince, MD.  I have reviewed the above documentation for accuracy and completeness, and I agree with the above. -  Quillian Quince, MD

## 2022-12-30 ENCOUNTER — Encounter (INDEPENDENT_AMBULATORY_CARE_PROVIDER_SITE_OTHER): Payer: Self-pay | Admitting: Family Medicine

## 2022-12-30 ENCOUNTER — Ambulatory Visit (INDEPENDENT_AMBULATORY_CARE_PROVIDER_SITE_OTHER): Payer: 59 | Admitting: Family Medicine

## 2022-12-30 VITALS — BP 139/83 | HR 60 | Temp 98.2°F | Ht 71.0 in | Wt 270.0 lb

## 2022-12-30 DIAGNOSIS — M62838 Other muscle spasm: Secondary | ICD-10-CM

## 2022-12-30 DIAGNOSIS — R7303 Prediabetes: Secondary | ICD-10-CM | POA: Diagnosis not present

## 2022-12-30 DIAGNOSIS — Z6837 Body mass index (BMI) 37.0-37.9, adult: Secondary | ICD-10-CM

## 2022-12-30 DIAGNOSIS — E669 Obesity, unspecified: Secondary | ICD-10-CM | POA: Diagnosis not present

## 2022-12-30 DIAGNOSIS — E559 Vitamin D deficiency, unspecified: Secondary | ICD-10-CM | POA: Diagnosis not present

## 2022-12-30 MED ORDER — VITAMIN D (ERGOCALCIFEROL) 1.25 MG (50000 UNIT) PO CAPS
ORAL_CAPSULE | ORAL | 0 refills | Status: DC
Start: 2022-12-30 — End: 2023-02-09

## 2022-12-30 MED ORDER — METFORMIN HCL 500 MG PO TABS
500.0000 mg | ORAL_TABLET | Freq: Two times a day (BID) | ORAL | 0 refills | Status: DC
Start: 2022-12-30 — End: 2023-02-09

## 2022-12-31 NOTE — Progress Notes (Signed)
Chief Complaint:   OBESITY Raymond Jordan is here to discuss his progress with his obesity treatment plan along with follow-up of his obesity related diagnoses. [place] is on the Category 3 Plan and states he is following his eating plan approximately 80% of the time. Chadrick states he is walking and doing chair exercises for 30-40 minutes 5-6 times per week.  Today's visit was #: 35 Starting weight: 292 lbs Starting date: 01/18/2020 Today's weight: 270 lbs Today's date: 12/30/2022 Total lbs lost to date: 22 Total lbs lost since last in-office visit: 0  Interim History: Patient has done well with maintaining his weight loss.  He is working on meal planning and prepping.  He may not be meeting his protein goal.  Subjective:   1. Muscle spasm Patient is having some nighttime leg spasms occasionally but he has a painful knot in his right side.  He has had that evaluated by Ortho who states it is not due to fracture or healing.  2. Prediabetes Patient is on metformin, and he is working on his diet and exercise. He denies nausea or vomiting.   3. Vitamin D deficiency Patient is stable on Vitamin D, and he has no signs of over-replacement.   Assessment/Plan:   1. Muscle spasm Patient is to increase his electrolytes for his legs and consider doing physical therapy for his side.  No referral needed.  2. Prediabetes Patient will continue metformin BID, and we will refill for 1 month.   - metFORMIN (GLUCOPHAGE) 500 MG tablet; Take 1 tablet (500 mg total) by mouth 2 (two) times daily with a meal.  Dispense: 60 tablet; Refill: 0  3. Vitamin D deficiency Patient will continue prescription Vitamin D weekly, and we will refill for 90 days.   - Vitamin D, Ergocalciferol, (DRISDOL) 1.25 MG (50000 UNIT) CAPS capsule; One Cap every 7 days  Dispense: 15 capsule; Refill: 0  4. BMI 37.0-37.9, adult  5. Obesity, Beginning BMI 40.73 Raymond Jordan is currently in the action stage of change. As such, his goal  is to continue with weight loss efforts. He has agreed to the Category 3 Plan.   Exercise goals: As is.   Behavioral modification strategies: increasing lean protein intake and increasing water intake.  Raymond Jordan has agreed to follow-up with our clinic in 6 weeks. He was informed of the importance of frequent follow-up visits to maximize his success with intensive lifestyle modifications for his multiple health conditions.   Objective:   Blood pressure 139/83, pulse 60, temperature 98.2 F (36.8 C), height 5\' 11"  (1.803 m), weight 270 lb (122.5 kg), SpO2 96 %. Body mass index is 37.66 kg/m.  Lab Results  Component Value Date   CREATININE 1.3 07/03/2022   BUN 20 07/03/2022   NA 140 07/03/2022   K 4.1 07/03/2022   CL 105 07/03/2022   CO2 30 (A) 07/03/2022   Lab Results  Component Value Date   ALT 12 07/03/2022   AST 16 07/03/2022   ALKPHOS 64 12/22/2021   BILITOT 0.3 12/22/2021   Lab Results  Component Value Date   HGBA1C 6.0 07/03/2022   HGBA1C 5.8 (H) 12/22/2021   HGBA1C 5.7 (H) 07/02/2021   HGBA1C 5.9 (H) 04/09/2021   HGBA1C 6.1 (H) 11/06/2020   Lab Results  Component Value Date   INSULIN 22.7 12/22/2021   INSULIN 23.8 07/02/2021   INSULIN 14.0 04/09/2021   INSULIN 27.9 (H) 01/18/2020   Lab Results  Component Value Date   TSH 2.040 01/18/2020  Lab Results  Component Value Date   CHOL 151 06/30/2022   HDL 59 06/30/2022   LDLCALC 74 06/30/2022   TRIG 98 06/30/2022   CHOLHDL 2.6 06/30/2022   Lab Results  Component Value Date   VD25OH 46.3 07/03/2022   VD25OH 48.1 12/22/2021   VD25OH 38.3 07/02/2021   Lab Results  Component Value Date   WBC 9.1 04/04/2021   HGB 14.6 04/04/2021   HCT 44.7 04/04/2021   MCV 87.1 04/04/2021   PLT 221 04/04/2021   No results found for: "IRON", "TIBC", "FERRITIN"  Attestation Statements:   Reviewed by clinician on day of visit: allergies, medications, problem list, medical history, surgical history, family history,  social history, and previous encounter notes.   I, Burt Knack, am acting as transcriptionist for Quillian Quince, MD.  I have reviewed the above documentation for accuracy and completeness, and I agree with the above. -  Quillian Quince, MD

## 2023-02-09 ENCOUNTER — Ambulatory Visit (INDEPENDENT_AMBULATORY_CARE_PROVIDER_SITE_OTHER): Payer: 59 | Admitting: Family Medicine

## 2023-02-09 ENCOUNTER — Encounter (INDEPENDENT_AMBULATORY_CARE_PROVIDER_SITE_OTHER): Payer: Self-pay | Admitting: Family Medicine

## 2023-02-09 VITALS — BP 143/86 | HR 64 | Temp 98.2°F | Ht 71.0 in | Wt 269.0 lb

## 2023-02-09 DIAGNOSIS — I10 Essential (primary) hypertension: Secondary | ICD-10-CM | POA: Diagnosis not present

## 2023-02-09 DIAGNOSIS — R7303 Prediabetes: Secondary | ICD-10-CM | POA: Diagnosis not present

## 2023-02-09 DIAGNOSIS — E669 Obesity, unspecified: Secondary | ICD-10-CM | POA: Diagnosis not present

## 2023-02-09 DIAGNOSIS — E559 Vitamin D deficiency, unspecified: Secondary | ICD-10-CM

## 2023-02-09 DIAGNOSIS — Z6837 Body mass index (BMI) 37.0-37.9, adult: Secondary | ICD-10-CM

## 2023-02-09 MED ORDER — VITAMIN D (ERGOCALCIFEROL) 1.25 MG (50000 UNIT) PO CAPS
ORAL_CAPSULE | ORAL | 0 refills | Status: DC
Start: 2023-02-09 — End: 2023-03-29

## 2023-02-09 MED ORDER — METFORMIN HCL 500 MG PO TABS
500.0000 mg | ORAL_TABLET | Freq: Two times a day (BID) | ORAL | 0 refills | Status: DC
Start: 2023-02-09 — End: 2023-03-29

## 2023-02-09 NOTE — Progress Notes (Unsigned)
Chief Complaint:   OBESITY Raymond Jordan is here to discuss his progress with his obesity treatment plan along with follow-up of his obesity related diagnoses. [name] is on the Category 3 Plan and states he is following his eating plan approximately 75% of the time. [name] states he is doing 0 minutes 0 times per week.  Today's visit was #: 36 Starting weight: 292 lbs Starting date: 01/18/2020 Today's weight: 269 lbs Today's date: 02/09/2023 Total lbs lost to date: 23 Total lbs lost since last in-office visit: 1  Interim History: Patient has been on vacation, but he was still able to be mindful of his food choices. His sodium level was likely increased due to eating out more. He has already gotten back on track with his Category 3 plan.   Subjective:   1. Essential hypertension Patient's blood pressure is elevated today at this office and at another medical office today. It is normally better controlled on his medications.   2. Prediabetes Patient is stable on metformin, and he denies nausea or vomiting. He is doing well with decreasing carbohydrates in his diet, and with his weight loss.   3. Vitamin D deficiency Patient is on Vitamin D prescription with no side effects noted.   Assessment/Plan:   1. Essential hypertension Patient's blood pressure could be elevated due to recent travel and increased eating out. We will recheck his blood pressure in 1 month. Patient is to try to check his blood pressure at home.   2. Prediabetes Patient will continue metformin 500 mg BID, and we will refill for 1 month.   - metFORMIN (GLUCOPHAGE) 500 MG tablet; Take 1 tablet (500 mg total) by mouth 2 (two) times daily with a meal.  Dispense: 60 tablet; Refill: 0  3. Vitamin D deficiency Patient will continue prescription Vitamin D, and we will refill for 90 days.   - Vitamin D, Ergocalciferol, (DRISDOL) 1.25 MG (50000 UNIT) CAPS capsule; One Cap every 7 days  Dispense: 15 capsule; Refill: 0  4.  BMI 37.0-37.9, adult  5. Obesity, Beginning BMI 40.73 [name] is currently in the action stage of change. As such, his goal is to continue with weight loss efforts. He has agreed to the Category 3 Plan.   Exercise goals: As is.   Behavioral modification strategies: increasing water intake and decreasing sodium intake.  [name] has agreed to follow-up with our clinic in 4 weeks. He was informed of the importance of frequent follow-up visits to maximize his success with intensive lifestyle modifications for his multiple health conditions.   Objective:   Blood pressure (!) 143/86, pulse 64, temperature 98.2 F (36.8 C), height 5\' 11"  (1.803 m), weight 269 lb (122 kg), SpO2 97%. Body mass index is 37.52 kg/m.  Lab Results  Component Value Date   CREATININE 1.3 07/03/2022   BUN 20 07/03/2022   NA 140 07/03/2022   K 4.1 07/03/2022   CL 105 07/03/2022   CO2 30 (A) 07/03/2022   Lab Results  Component Value Date   ALT 12 07/03/2022   AST 16 07/03/2022   ALKPHOS 64 12/22/2021   BILITOT 0.3 12/22/2021   Lab Results  Component Value Date   HGBA1C 6.0 07/03/2022   HGBA1C 5.8 (H) 12/22/2021   HGBA1C 5.7 (H) 07/02/2021   HGBA1C 5.9 (H) 04/09/2021   HGBA1C 6.1 (H) 11/06/2020   Lab Results  Component Value Date   INSULIN 22.7 12/22/2021   INSULIN 23.8 07/02/2021   INSULIN 14.0 04/09/2021  INSULIN 27.9 (H) 01/18/2020   Lab Results  Component Value Date   TSH 2.040 01/18/2020   Lab Results  Component Value Date   CHOL 151 06/30/2022   HDL 59 06/30/2022   LDLCALC 74 06/30/2022   TRIG 98 06/30/2022   CHOLHDL 2.6 06/30/2022   Lab Results  Component Value Date   VD25OH 46.3 07/03/2022   VD25OH 48.1 12/22/2021   VD25OH 38.3 07/02/2021   Lab Results  Component Value Date   WBC 9.1 04/04/2021   HGB 14.6 04/04/2021   HCT 44.7 04/04/2021   MCV 87.1 04/04/2021   PLT 221 04/04/2021   No results found for: "IRON", "TIBC", "FERRITIN"  Attestation Statements:   Reviewed  by clinician on day of visit: allergies, medications, problem list, medical history, surgical history, family history, social history, and previous encounter notes.   I, Burt Knack, am acting as transcriptionist for Quillian Quince, MD.  I have reviewed the above documentation for accuracy and completeness, and I agree with the above. -  Quillian Quince, MD

## 2023-03-14 ENCOUNTER — Other Ambulatory Visit (INDEPENDENT_AMBULATORY_CARE_PROVIDER_SITE_OTHER): Payer: Self-pay | Admitting: Family Medicine

## 2023-03-14 DIAGNOSIS — R7303 Prediabetes: Secondary | ICD-10-CM

## 2023-03-20 ENCOUNTER — Other Ambulatory Visit: Payer: Self-pay | Admitting: Cardiology

## 2023-03-29 ENCOUNTER — Telehealth: Payer: Self-pay | Admitting: Cardiology

## 2023-03-29 ENCOUNTER — Ambulatory Visit (INDEPENDENT_AMBULATORY_CARE_PROVIDER_SITE_OTHER): Payer: 59 | Admitting: Family Medicine

## 2023-03-29 ENCOUNTER — Encounter (INDEPENDENT_AMBULATORY_CARE_PROVIDER_SITE_OTHER): Payer: Self-pay | Admitting: Family Medicine

## 2023-03-29 VITALS — BP 152/82 | HR 59 | Temp 98.6°F | Ht 71.0 in | Wt 280.0 lb

## 2023-03-29 DIAGNOSIS — R7303 Prediabetes: Secondary | ICD-10-CM

## 2023-03-29 DIAGNOSIS — I10 Essential (primary) hypertension: Secondary | ICD-10-CM | POA: Diagnosis not present

## 2023-03-29 DIAGNOSIS — E559 Vitamin D deficiency, unspecified: Secondary | ICD-10-CM | POA: Diagnosis not present

## 2023-03-29 DIAGNOSIS — Z6839 Body mass index (BMI) 39.0-39.9, adult: Secondary | ICD-10-CM

## 2023-03-29 DIAGNOSIS — E669 Obesity, unspecified: Secondary | ICD-10-CM | POA: Diagnosis not present

## 2023-03-29 DIAGNOSIS — E7800 Pure hypercholesterolemia, unspecified: Secondary | ICD-10-CM

## 2023-03-29 MED ORDER — METFORMIN HCL 500 MG PO TABS
500.0000 mg | ORAL_TABLET | Freq: Two times a day (BID) | ORAL | 0 refills | Status: DC
Start: 2023-03-29 — End: 2023-05-03

## 2023-03-29 MED ORDER — NEXLIZET 180-10 MG PO TABS: 1.0000 | ORAL_TABLET | Freq: Every day | ORAL | 0 refills | Status: DC

## 2023-03-29 MED ORDER — CHLORTHALIDONE 25 MG PO TABS: 25.0000 mg | ORAL_TABLET | Freq: Every day | ORAL | 0 refills | Status: DC

## 2023-03-29 MED ORDER — VITAMIN D (ERGOCALCIFEROL) 1.25 MG (50000 UNIT) PO CAPS
ORAL_CAPSULE | ORAL | 0 refills | Status: DC
Start: 2023-03-29 — End: 2023-05-03

## 2023-03-29 MED ORDER — METOPROLOL TARTRATE 50 MG PO TABS: 50.0000 mg | ORAL_TABLET | Freq: Two times a day (BID) | ORAL | 0 refills | Status: DC

## 2023-03-29 MED ORDER — NEXLIZET 180-10 MG PO TABS: 1.0000 | ORAL_TABLET | Freq: Every day | ORAL | Status: DC

## 2023-03-29 MED ORDER — CLOPIDOGREL BISULFATE 75 MG PO TABS: 75.0000 mg | ORAL_TABLET | Freq: Every day | ORAL | 0 refills | Status: DC

## 2023-03-29 MED ORDER — LOSARTAN POTASSIUM 25 MG PO TABS: 25.0000 mg | ORAL_TABLET | Freq: Every day | ORAL | 0 refills | Status: DC

## 2023-03-29 MED ORDER — PRAVASTATIN SODIUM 80 MG PO TABS
80.0000 mg | ORAL_TABLET | Freq: Every day | ORAL | 0 refills | Status: DC
Start: 2023-03-29 — End: 2023-04-27

## 2023-03-29 NOTE — Telephone Encounter (Signed)
Pt c/o medication issue:  1. Name of Medication:  Bempedoic Acid-Ezetimibe (NEXLIZET) 180-10 MG TABS  2. How are you currently taking this medication (dosage and times per day)?   3. Are you having a reaction (difficulty breathing--STAT)?   4. What is your medication issue?   Patient would like to know if there are any coupons available at the office for this medication. Please advise.

## 2023-03-29 NOTE — Progress Notes (Signed)
Raymond Jordan, D.O.  ABFM, ABOM Specializing in Clinical Bariatric Medicine  Office located at: [address] [place], [place]  [address]     Assessment and Plan:   Medications Discontinued During This Encounter  Medication Reason   metFORMIN (GLUCOPHAGE) 500 MG tablet Reorder   Vitamin D, Ergocalciferol, (DRISDOL) 1.25 MG (50000 UNIT) CAPS capsule Reorder    Meds ordered this encounter  Medications   metFORMIN (GLUCOPHAGE) 500 MG tablet    Sig: Take 1 tablet (500 mg total) by mouth 2 (two) times daily with a meal.    Dispense:  60 tablet    Refill:  0   Vitamin D, Ergocalciferol, (DRISDOL) 1.25 MG (50000 UNIT) CAPS capsule    Sig: One Cap every 7 days    Dispense:  12 capsule    Refill:  0    Come fasting to next OV to obtain full set of labs.   Prediabetes Assessment & Plan: Lab Results  Component Value Date   HGBA1C 6.0 07/03/2022   HGBA1C 5.8 (H) 12/22/2021   HGBA1C 5.7 (H) 07/02/2021   INSULIN 22.7 12/22/2021   INSULIN 23.8 07/02/2021   INSULIN 14.0 04/09/2021   He is on Metformin 500 mg bid. He reports occasional loose stools, but states overall he is tolerating Metformin well with no other side effects. His hunger and cravings are well controlled, even when he is off plan. Pt states he has a tendency to gain weight in fall/winter, even when he was younger. We discussed the possible contributing factors for this, such as decreased activity levels and unhealthy eating habits. We reviewed his last A1C of 6.0 from 07/03/2022. Of note, Pt has not had lab work done since 06/2022.  I recommend he come in fasting with Dr. Dalbert Garnet at next visit to obtain a full set of labs. Continue to manage his prediabetes with his current regimen with Metformin and decreasing simple carbs/ sugars; increasing fiber and proteins -> follow his meal plan.  Will refill Metformin today.    Essential hypertension Assessment & Plan: BP Readings from Last 3 Encounters:  03/29/23  (!) 152/82  02/09/23 (!) 143/86  12/30/22 139/83   - Raymond Humbles Jr. BP is not at goal today, elevated at 152/82. He is on metoprolol 50 bid, losartan 25 mg daily, and chlorthalidone 25 mg daily. Tolerating well with no adverse side effects. He is monitoring his blood pressure at home and reports readings in the 130s/70-80s. 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 He is established with cardiology and is scheduled to follow up next month.   I recommend checking blood pressures every other day. If BP remains elevated above 130s/80s, discuss the possibility of treatment adjustments with his specialist. Avoid foods high in sodium. Continue with all antihypertensives per PCP/specialist. We will continue to monitor symptoms as they relate to the his weight loss journey.   Vitamin D deficiency Assessment & Plan: Lab Results  Component Value Date   VD25OH 46.3 07/03/2022   VD25OH 48.1 12/22/2021   VD25OH 38.3 07/02/2021   Pt is taking ERGO 50K units once weekly. Tolerating well with no significant side effects. Per latest labs from 07/03/22, his vitamin D was 46.3. Recommend repeating labs at next visit.  Will refill Ergo today.    Obesity, Beginning BMI 40.73 BMI 39.0-39.9,adult-current BMI 39.07 Assessment & Plan: Raymond Orourke. is here to discuss his progress with his obesity treatment plan along  with follow-up of his obesity related diagnoses. See Medical Weight Management Flowsheet for complete bioelectrical impedance results.  Since last office visit on 02/09/23 patient's muscle mass has increased by 2.2 lbs. Fat mass has increased by 8.4 lbs. Total body water has increased by 5.8 lbs.  Counseling done on how various foods will affect these numbers and how to maximize success  Total lbs lost to date: 12 lbs Total weight loss percentage to date: -4.11 %   No change to meal plan - see Subjective  Behavioral Intervention Additional resources provided  today: category 3 meal plan information, breakfast options, and lunch options Evidence-based interventions for health behavior change were utilized today including the discussion of self monitoring techniques, problem-solving barriers and SMART goal setting techniques.   Regarding patient's less desirable eating habits and patterns, we employed the technique of small changes.  Pt will specifically work on: Following his meal plan, come back to next appt fasting for labs for next visit.    FOLLOW UP: Return in about 4 weeks (around 04/26/2023). He was informed of the importance of frequent follow up visits to maximize his success with intensive lifestyle modifications for his multiple health conditions.  Subjective:   Chief complaint: Obesity Raymond Jordan is here to discuss his progress with his obesity treatment plan. He is on the the Category 3 Plan and states he is following his eating plan approximately 75% of the time. He states he is not exercising.  Interval History:  Raymond Jordan. is here for a follow up office visit. This is our first time seeing this Pt, since he is an add on Pt and follows up with Dr. Dalbert Garnet. Doesn't have increased hunger or cravings even when not following his meal plan. For breakfast he tends to eat eggs or a high fiber cereal with Fairlife milk. Yesterday he had eggs and bacon for breakfast and a T-bone steak for dinner. He enjoys watching football on Sundays and occasionally has a couple drinks (type of alcohol varies) while watching.   Pharmacotherapy for weight loss: He is currently taking  Metformin  for medical weight loss.  Denies side effects.    Review of Systems:  Pertinent positives were addressed with patient today.  Reviewed by clinician on day of visit: allergies, medications, problem list, medical history, surgical history, family history, social history, and previous encounter notes.  Weight Summary and Biometrics   Weight Lost Since Last Visit:  0lb  Weight Gained Since Last Visit: 11lb   Vitals Temp: 98.6 F (37 C) BP: (!) 152/82 Pulse Rate: (!) 59 SpO2: 100 %   Anthropometric Measurements Height: 5\' 11"  (1.803 m) Weight: 280 lb (127 kg) BMI (Calculated): 39.07 Weight at Last Visit: 269lb Weight Lost Since Last Visit: 0lb Weight Gained Since Last Visit: 11lb Starting Weight: 292lb Total Weight Loss (lbs): 12 lb (5.443 kg)   Body Composition  Body Fat %: 38 % Fat Mass (lbs): 106.8 lbs Muscle Mass (lbs): 165 lbs Total Body Water (lbs): 121 lbs Visceral Fat Rating : 23   Other Clinical Data Fasting: no Labs: no Today's Visit #: 37 Starting Date: 01/18/20    Objective:   PHYSICAL EXAM: Blood pressure (!) 152/82, pulse (!) 59, temperature 98.6 F (37 C), height 5\' 11"  (1.803 m), weight 280 lb (127 kg), SpO2 100%. Body mass index is 39.05 kg/m.  General: Well Developed, well nourished, and in no acute distress.  HEENT: Normocephalic, atraumatic Skin: Warm and dry, cap RF less 2 sec, good  turgor Chest:  Normal excursion, shape, no gross abn Respiratory: speaking in full sentences, no conversational dyspnea NeuroM-Sk: Ambulates w/o assistance, moves * 4 Psych: A and O *3, insight good, mood-full  DIAGNOSTIC DATA REVIEWED:  BMET    Component Value Date/Time   NA 140 07/03/2022 0000   K 4.1 07/03/2022 0000   CL 105 07/03/2022 0000   CO2 30 (A) 07/03/2022 0000   GLUCOSE 102 (H) 12/22/2021 0820   GLUCOSE 96 04/04/2021 0107   BUN 20 07/03/2022 0000   CREATININE 1.3 07/03/2022 0000   CREATININE 1.38 (H) 12/22/2021 0820   CALCIUM 9.6 07/03/2022 0000   GFRNONAA 50 (L) 04/04/2021 0107   GFRAA 79 01/18/2020 1535   Lab Results  Component Value Date   HGBA1C 6.0 07/03/2022   HGBA1C 6.2 (H) 01/18/2020   Lab Results  Component Value Date   INSULIN 22.7 12/22/2021   INSULIN 27.9 (H) 01/18/2020   Lab Results  Component Value Date   TSH 2.040 01/18/2020   CBC    Component Value Date/Time    WBC 9.1 04/04/2021 0107   RBC 5.13 04/04/2021 0107   HGB 14.6 04/04/2021 0107   HGB 13.0 11/06/2020 1107   HCT 44.7 04/04/2021 0107   HCT 40.3 11/06/2020 1107   PLT 221 04/04/2021 0107   PLT 174 11/06/2020 1107   MCV 87.1 04/04/2021 0107   MCV 85 11/06/2020 1107   MCH 28.5 04/04/2021 0107   MCHC 32.7 04/04/2021 0107   RDW 14.0 04/04/2021 0107   RDW 14.1 11/06/2020 1107   Iron Studies No results found for: "IRON", "TIBC", "FERRITIN", "IRONPCTSAT" Lipid Panel     Component Value Date/Time   CHOL 151 06/30/2022 0710   TRIG 98 06/30/2022 0710   HDL 59 06/30/2022 0710   CHOLHDL 2.6 06/30/2022 0710   CHOLHDL 2.7 05/04/2016 0736   VLDL 14 05/04/2016 0736   LDLCALC 74 06/30/2022 0710   Hepatic Function Panel     Component Value Date/Time   PROT 7.8 12/22/2021 0820   ALBUMIN 4.1 07/03/2022 0000   ALBUMIN 4.2 12/22/2021 0820   AST 16 07/03/2022 0000   ALT 12 07/03/2022 0000   ALKPHOS 64 12/22/2021 0820   BILITOT 0.3 12/22/2021 0820   BILIDIR 0.09 01/13/2019 0845   IBILI 0.2 05/04/2016 0736      Component Value Date/Time   TSH 2.040 01/18/2020 1535   Nutritional Lab Results  Component Value Date   VD25OH 46.3 07/03/2022   VD25OH 48.1 12/22/2021   VD25OH 38.3 07/02/2021    Attestations:   I, Raymond Jordan, acting as a Stage manager for Raymond Lot, DO., have compiled all relevant documentation for today's office visit on behalf of Raymond Lot, DO, while in the presence of [employer], DO.  Reviewed by clinician on day of visit: allergies, medications, problem list, medical history, surgical history, family history, social history, and previous encounter notes pertinent to patient's obesity diagnosis.    This encounter took 46 total minutes of time including time coordinating the above treatment plan, any pre-visit chart review and post-visit documentation and reviewing any labs and/or imaging, reviewing pertinent prior notes, and providing counseling the  patient about such treatment plan.  Approximately 50% of this time was spent in direct, face-to-face counseling and coordination of care.  I have reviewed the above documentation for accuracy and completeness, and I agree with the above. Raymond Jordan, D.O.  The 21st Century Cures Act was signed into law in 2016 which includes the topic  of electronic health records.  This provides immediate access to information in MyChart.  This includes consultation notes, operative notes, office notes, lab results and pathology reports.  If you have any questions about what you read please let us know at your next visit so we can discuss your concerns and take corrective action if need be.  We are right here with you.

## 2023-03-29 NOTE — Telephone Encounter (Signed)
Called pt advised we have $10 co pay cards we can also give samples.  Pt appreciative of this will stop by our office today to pick up.   28 days of Nexlizet samples and co pay card left a front desk for pick up.

## 2023-03-29 NOTE — Telephone Encounter (Signed)
*  STAT* If patient is at the pharmacy, call can be transferred to refill team.   1. Which medications need to be refilled? (please list name of each medication and dose if known)  losartan (COZAAR) 25 MG tablet pravastatin (PRAVACHOL) 80 MG tablet Bempedoic Acid-Ezetimibe (NEXLIZET) 180-10 MG TABS chlorthalidone (HYGROTON) 25 MG tablet metoprolol tartrate (LOPRESSOR) 50 MG tablet clopidogrel (PLAVIX) 75 MG tablet  2. Which pharmacy/location (including street and city if local pharmacy) is medication to be sent to? [facility] - [place], [place] - [address]   3. Do they need a 30 day or 90 day supply?  90 day supply

## 2023-04-13 ENCOUNTER — Ambulatory Visit (INDEPENDENT_AMBULATORY_CARE_PROVIDER_SITE_OTHER): Payer: 59 | Admitting: Family Medicine

## 2023-04-25 ENCOUNTER — Other Ambulatory Visit (INDEPENDENT_AMBULATORY_CARE_PROVIDER_SITE_OTHER): Payer: Self-pay | Admitting: Family Medicine

## 2023-04-25 DIAGNOSIS — R7303 Prediabetes: Secondary | ICD-10-CM

## 2023-04-27 ENCOUNTER — Other Ambulatory Visit: Payer: Self-pay | Admitting: Cardiology

## 2023-04-27 DIAGNOSIS — E7800 Pure hypercholesterolemia, unspecified: Secondary | ICD-10-CM

## 2023-04-29 ENCOUNTER — Other Ambulatory Visit (INDEPENDENT_AMBULATORY_CARE_PROVIDER_SITE_OTHER): Payer: Self-pay | Admitting: Family Medicine

## 2023-04-29 DIAGNOSIS — R7303 Prediabetes: Secondary | ICD-10-CM

## 2023-05-02 NOTE — Progress Notes (Signed)
Cardiology Office Note:  .   Date:  05/03/2023  ID:  Raymond Jordan., DOB 1960-08-28, MRN [id number] PCP: Shon Hale, MD  [facility] Providers Cardiologist:  Armanda Magic, MD {  History of Present Illness: .   Raymond Jordan. is a 62 y.o. male with a past medical history of HTN, OSA, obesity, CAD status post non-STEMI with cath showing 99% proximal RCA status post DES (now on DAPT) with normal LVEF.  He is here for follow-up appointment.  Last seen 03/13/2022 by Dr. Mayford Knife was doing well at that time.  Denied chest pain/pressure, SOB, DOE, PND, orthopnea, lower extremity edema, dizziness, palpitations, or syncope.  Compliant with medications.  Of note, he was doing well with his PAP device and thought that he was getting used to.  Tolerating the mask and feels adequate pressure.  Today, he tells me he has been feeling okay.  He usually sleeps with 2 pillows.  Has been involved with healthy weight and wellness.  Sometimes he takes naps without his CPAP and wakes up gasping for breath.  However, when he wears his CPAP feels like this helps significantly.  Getting good rest at night.  He tells me he was recently diagnosed with eosinophilic esophagitis and was wondering if this would affect his CPAP.  He was started on Protonix.  No other medical management needed at this time.   Reports no shortness of breath nor dyspnea on exertion. Reports no chest pain, pressure, or tightness. No edema, orthopnea, PND. Reports no palpitations.   ROS: Pertinent ROS in HPI  Studies Reviewed: Marland Kitchen   EKG Interpretation Date/Time:  Monday May 03 2023 11:10:31 EDT Ventricular Rate:  53 PR Interval:  176 QRS Duration:  74 QT Interval:  426 QTC Calculation: 399 R Axis:   9  Text Interpretation: Sinus bradycardia Minimal voltage criteria for LVH, may be normal variant ( R in aVL ) When compared with ECG of 25-Dec-2015 17:11, No significant change was found Confirmed by Jari Favre  [phone]) on 05/03/2023 12:58:51 PM   Echo 10/13/22 IMPRESSIONS     1. Left ventricular ejection fraction, by estimation, is 60 to 65%. The  left ventricle has normal function. The left ventricle has no regional  wall motion abnormalities. Left ventricular diastolic parameters are  indeterminate.   2. Right ventricular systolic function is normal. The right ventricular  size is normal. There is normal pulmonary artery systolic pressure.   3. Left atrial size was mildly dilated.   4. The mitral valve is normal in structure. No evidence of mitral valve  regurgitation. No evidence of mitral stenosis.   5. The aortic valve is tricuspid. Aortic valve regurgitation is not  visualized. No aortic stenosis is present.   6. The inferior vena cava is normal in size with greater than 50%  respiratory variability, suggesting right atrial pressure of 3 mmHg.   Comparison(s): No significant change from prior study.   Conclusion(s)/Recommendation(s): Normal biventricular function without  evidence of hemodynamically significant valvular heart disease.   FINDINGS   Left Ventricle: Left ventricular ejection fraction, by estimation, is 60  to 65%. The left ventricle has normal function. The left ventricle has no  regional wall motion abnormalities. The left ventricular internal cavity  size was normal in size. There is   no left ventricular hypertrophy. Left ventricular diastolic parameters  are indeterminate.   Right Ventricle: The right ventricular size is normal. No increase in  right ventricular wall thickness. Right  ventricular systolic function is  normal. There is normal pulmonary artery systolic pressure. The tricuspid  regurgitant velocity is 2.31 m/s, and   with an assumed right atrial pressure of 3 mmHg, the estimated right  ventricular systolic pressure is 24.3 mmHg.   Left Atrium: Left atrial size was mildly dilated.   Right Atrium: Right atrial size was normal in size.    Pericardium: There is no evidence of pericardial effusion.   Mitral Valve: The mitral valve is normal in structure. There is mild  calcification of the mitral valve leaflet(s). No evidence of mitral valve  regurgitation. No evidence of mitral valve stenosis.   Tricuspid Valve: The tricuspid valve is normal in structure. Tricuspid  valve regurgitation is trivial. No evidence of tricuspid stenosis.   Aortic Valve: The aortic valve is tricuspid. Aortic valve regurgitation is  not visualized. No aortic stenosis is present.   Pulmonic Valve: The pulmonic valve was not well visualized. Pulmonic valve  regurgitation is trivial. No evidence of pulmonic stenosis.   Aorta: The aortic root and ascending aorta are structurally normal, with  no evidence of dilitation.   Venous: The inferior vena cava is normal in size with greater than 50%  respiratory variability, suggesting right atrial pressure of 3 mmHg.   IAS/Shunts: No atrial level shunt detected by color flow Doppler.      Physical Exam:   VS:  BP 128/80   Pulse (!) 59   Ht 5\' 11"  (1.803 m)   Wt 280 lb 3.2 oz (127.1 kg)   SpO2 98%   BMI 39.08 kg/m    Wt Readings from Last 3 Encounters:  05/03/23 280 lb 3.2 oz (127.1 kg)  05/03/23 278 lb (126.1 kg)  03/29/23 280 lb (127 kg)    GEN: Well nourished, well developed in no acute distress NECK: No JVD; No carotid bruits CARDIAC: RRR, no murmurs, rubs, gallops RESPIRATORY:  Clear to auscultation without rales, wheezing or rhonchi  ABDOMEN: Soft, non-tender, non-distended EXTREMITIES:  No edema; No deformity   ASSESSMENT AND PLAN: .   1.  OSA on CPAP -Compliant nightly with CPAP treatment -He feels like the pressure is adequate and is able to sleep well at night -Occasionally he does not use CPAP for naps and notices a difference  2.  Essential hypertension -Blood pressure well-controlled today and he also presents a log over the past month or so with well-controlled blood  pressures -Would continue current medication regimen with losartan 25 mg daily, Lopressor 50 mg twice a day, pravastatin 80 mg daily, chlorthalidone 25 mg daily, next visit 180/10 mg daily, Plavix 75 mg daily, aspirin 81 mg daily  3.  CAD -Status post NSTEMI with cardiac cath showing 99% proximal RCA status post DES in 09/2015 -Lexiscan Myoview 01/2020 showed no ischemia -Recommended continued dual antiplatelet therapy with 81 mg daily of aspirin and 75 mg of Plavix daily -He is having some arthritis symptoms and recommended Tylenol arthritis versus NSAIDs  4.  Pure hypercholesterolemia   -Lipid panel with LDL 74 -Lipid panel was ordered by healthy weight and wellness and was drawn today, will follow results -Goal with history of CAD and stenting is less than 70. -continue current medications for now    Dispo: He can f/u in a year with Dr. Mayford Knife  Signed, Sharlene Dory, PA-C

## 2023-05-03 ENCOUNTER — Encounter (INDEPENDENT_AMBULATORY_CARE_PROVIDER_SITE_OTHER): Payer: Self-pay | Admitting: Family Medicine

## 2023-05-03 ENCOUNTER — Encounter: Payer: Self-pay | Admitting: Physician Assistant

## 2023-05-03 ENCOUNTER — Ambulatory Visit: Payer: 59 | Attending: Physician Assistant | Admitting: Physician Assistant

## 2023-05-03 ENCOUNTER — Ambulatory Visit (INDEPENDENT_AMBULATORY_CARE_PROVIDER_SITE_OTHER): Payer: 59 | Admitting: Family Medicine

## 2023-05-03 VITALS — BP 128/80 | HR 59 | Ht 71.0 in | Wt 280.2 lb

## 2023-05-03 VITALS — BP 148/68 | HR 62 | Temp 98.2°F | Ht 71.0 in | Wt 278.0 lb

## 2023-05-03 DIAGNOSIS — Z6838 Body mass index (BMI) 38.0-38.9, adult: Secondary | ICD-10-CM

## 2023-05-03 DIAGNOSIS — E559 Vitamin D deficiency, unspecified: Secondary | ICD-10-CM | POA: Diagnosis not present

## 2023-05-03 DIAGNOSIS — E669 Obesity, unspecified: Secondary | ICD-10-CM | POA: Diagnosis not present

## 2023-05-03 DIAGNOSIS — R7303 Prediabetes: Secondary | ICD-10-CM

## 2023-05-03 DIAGNOSIS — I10 Essential (primary) hypertension: Secondary | ICD-10-CM | POA: Diagnosis not present

## 2023-05-03 DIAGNOSIS — G4733 Obstructive sleep apnea (adult) (pediatric): Secondary | ICD-10-CM | POA: Diagnosis not present

## 2023-05-03 DIAGNOSIS — E7800 Pure hypercholesterolemia, unspecified: Secondary | ICD-10-CM

## 2023-05-03 DIAGNOSIS — I2510 Atherosclerotic heart disease of native coronary artery without angina pectoris: Secondary | ICD-10-CM

## 2023-05-03 DIAGNOSIS — Z6839 Body mass index (BMI) 39.0-39.9, adult: Secondary | ICD-10-CM

## 2023-05-03 MED ORDER — VITAMIN D (ERGOCALCIFEROL) 1.25 MG (50000 UNIT) PO CAPS: ORAL_CAPSULE | ORAL | 0 refills | Status: DC

## 2023-05-03 MED ORDER — CHLORTHALIDONE 25 MG PO TABS: 25.0000 mg | ORAL_TABLET | Freq: Every day | ORAL | 3 refills | Status: DC

## 2023-05-03 MED ORDER — PRAVASTATIN SODIUM 80 MG PO TABS: 80.0000 mg | ORAL_TABLET | Freq: Every day | ORAL | 3 refills | Status: DC

## 2023-05-03 MED ORDER — NEXLIZET 180-10 MG PO TABS: 1.0000 | ORAL_TABLET | Freq: Every day | ORAL | 3 refills | Status: DC

## 2023-05-03 MED ORDER — METOPROLOL TARTRATE 50 MG PO TABS: 50.0000 mg | ORAL_TABLET | Freq: Two times a day (BID) | ORAL | 3 refills | Status: DC

## 2023-05-03 MED ORDER — LOSARTAN POTASSIUM 25 MG PO TABS: 25.0000 mg | ORAL_TABLET | Freq: Every day | ORAL | 3 refills | Status: DC

## 2023-05-03 MED ORDER — METFORMIN HCL 500 MG PO TABS: 500.0000 mg | ORAL_TABLET | Freq: Two times a day (BID) | ORAL | 0 refills | Status: DC

## 2023-05-03 MED ORDER — CLOPIDOGREL BISULFATE 75 MG PO TABS: 75.0000 mg | ORAL_TABLET | Freq: Every day | ORAL | 3 refills | Status: DC

## 2023-05-03 NOTE — Progress Notes (Signed)
[name], D.O.  ABFM, ABOM Specializing in Clinical Bariatric Medicine  Office located at: [address] [place], [place]  [address]     Assessment and Plan:  Review labs with pt next OV Orders Placed This Encounter  Procedures   CBC with Differential/Platelet   VITAMIN D 25 Hydroxy (Vit-D Deficiency, Fractures)   Comprehensive metabolic panel   Folate   Hemoglobin A1c   Insulin, random   Lipid Panel With LDL/HDL Ratio   T4, free   TSH   Vitamin B12   Medications Discontinued During This Encounter  Medication Reason   metFORMIN (GLUCOPHAGE) 500 MG tablet Reorder   Vitamin D, Ergocalciferol, (DRISDOL) 1.25 MG (50000 UNIT) CAPS capsule Reorder    Meds ordered this encounter  Medications   Vitamin D, Ergocalciferol, (DRISDOL) 1.25 MG (50000 UNIT) CAPS capsule    Sig: One Cap every 7 days    Dispense:  12 capsule    Refill:  0   metFORMIN (GLUCOPHAGE) 500 MG tablet    Sig: Take 1 tablet (500 mg total) by mouth 2 (two) times daily with a meal.    Dispense:  60 tablet    Refill:  0    Vitamin D deficiency Assessment: Condition is Not at goal.. Pt vitamin D level is sub-optimal and this is being treated with Ergocalciferol 50K lU. He tolerates this medications and denies any adverse side effects.  Lab Results  Component Value Date   VD25OH 46.3 07/03/2022   VD25OH 48.1 12/22/2021   VD25OH 38.3 07/02/2021   Plan:- Continue with Ergocalciferol 50K IU weekly and he was encouraged to continue to take the medicine until told otherwise. I will refill today.   - weight loss will likely improve availability of vitamin D, thus encouraged Tyrice to continue with meal plan and their weight loss efforts to further improve this condition.  Thus, we will need to monitor levels regularly (every 3-4 mo on average) to keep levels within normal limits and prevent over supplementation.   Essential hypertension Assessment: Condition is Not at goal.. Pt BP is elevated today as  the last 3 readings. Pt has been monitoring his BP since last OV. Since then he has noticed that his BP is typically high in the mornings. His BP has been 134/76, 124/61, 119/63, 123/69, 128/64, 130/69 and his heart rate ranges between the 60-70's. This is being treated with Losartan, chlorthalidone, metoprolol. He will see his cardiologist today and also receive labs.  Last 3 blood pressure readings in our office are as follows: BP Readings from Last 3 Encounters:  05/03/23 (!) 148/68  03/29/23 (!) 152/82  02/09/23 (!) 143/86   Plan:- Continue with metoprolol 50 bid, losartan 25 mg daily, and chlorthalidone 25 mg as directed.   - Continue to avoid buying foods that are: processed, frozen, or prepackaged to avoid excess salt.  - Ambulatory blood pressure monitoring encouraged.  Reminded patient that if they ever feel poorly in any way, to check their blood pressure and pulse as well.  - We will continue to monitor closely alongside PCP/ specialists.  Pt reminded to also f/up with those individuals as instructed by them.    Prediabetes Assessment: Condition is Not at goal.. This is being treated with Metformin and he denies any excess hunger and cravings. He denies any GI upset or N/V/D.  Lab Results  Component Value Date   HGBA1C 6.0 07/03/2022   HGBA1C 5.8 (H) 12/22/2021   HGBA1C 5.7 (H) 07/02/2021  INSULIN 22.7 12/22/2021   INSULIN 23.8 07/02/2021   INSULIN 14.0 04/09/2021    Plan: - Continue Metformin 500nmg as directed. I will refill today.    - Continue to decrease simple carbs/ sugars; increase fiber and proteins -> follow his meal plan.    - Anticipatory guidance given.    Rocko Yarman will continue to work on weight loss, exercise, via their meal plan we devised to help decrease the risk of progressing to diabetes.    TREATMENT PLAN FOR OBESITY: BMI 39.0-39.9,adult-current BMI 38.79 Obesity, Beginning BMI 40.73 Assessment:  Mathan Destin. is here to discuss his  progress with his obesity treatment plan along with follow-up of his obesity related diagnoses. See Medical Weight Management Flowsheet for complete bioelectrical impedance results.  Condition is docourse: improving. Biometric data collected today, was reviewed with patient.   Since last office visit on 03/29/2023 patient's  Muscle mass has increased by 1.6lb. Fat mass has decreased by 3.6lb. Total body water has decreased by 3.4lb.  Counseling done on how various foods will affect these numbers and how to maximize success  Total lbs lost to date: 14 Total weight loss percentage to date: 4.79%   Plan: - Continue to follow the Category 3 nutritional plan.   - I recommended her try [employer] BBQ sauce and other sauces.   - I also recommended that he try [employer]al prep in [place].   Behavioral Intervention Additional resources provided today: category 3 meal plan information Evidence-based interventions for health behavior change were utilized today including the discussion of self monitoring techniques, problem-solving barriers and SMART goal setting techniques.   Regarding patient's less desirable eating habits and patterns, we employed the technique of small changes.  Pt will specifically work on: Increase walking to 4 days a week for for next visit.     He has agreed to Continue current level of physical activity  and Increase the intensity, frequency or duration of aerobic exercises     FOLLOW UP: Return in about 29 days (around 06/01/2023).  He was informed of the importance of frequent follow up visits to maximize his success with intensive lifestyle modifications for his multiple health conditions.[name]. is aware that we will review all of his lab results at our next visit together in person.  He is aware that if anything is critical/ life threatening with the results, we will be contacting him via MyChart or by my CMA will be calling them prior to the office  visit to discuss acute management.    Subjective:   Chief complaint: Obesity Sherrod is here to discuss his progress with his obesity treatment plan. He is on the the Category 3 Plan and states he is following his eating plan approximately 80% of the time. He states he is walking 30 minutes 1-2 days per week.  Interval History:  Shigeru Lamberth. is here for a follow up office visit.     Since last office visit:  He informed me that he has been trying to eat more lean meat, veggies, and fruit. He has gotten a special K cereal and low carb bread. He endorses hating to eat the same lunch all the time. He meal preps every Sunday as it is easier. Pt didn't know that he was able to eat certain dressings and sauces. He does note eating some burgers occasionally but will try to switch to lean burgers.    We reviewed his meal plan and  all questions were answered.   Pharmacotherapy for weight loss: He is currently taking  Metformin  for medical weight loss.  Denies side effects.    Review of Systems:  Pertinent positives were addressed with patient today.  Reviewed by clinician on day of visit: allergies, medications, problem list, medical history, surgical history, family history, social history, and previous encounter notes.  Weight Summary and Biometrics   Weight Lost Since Last Visit: 2lb  Weight Gained Since Last Visit: 0lb   Vitals Temp: 98.2 F (36.8 C) BP: (!) 148/68 Pulse Rate: 62 SpO2: 96 %   Anthropometric Measurements Height: 5\' 11"  (1.803 m) Weight: 278 lb (126.1 kg) BMI (Calculated): 38.79 Weight at Last Visit: 280lb Weight Lost Since Last Visit: 2lb Weight Gained Since Last Visit: 0lb Starting Weight: 292lb Total Weight Loss (lbs): 14 lb (6.35 kg)   Body Composition  Body Fat %: 37.1 % Fat Mass (lbs): 103.2 lbs Muscle Mass (lbs): 166.6 lbs Total Body Water (lbs): 117.6 lbs Visceral Fat Rating : 23   Other Clinical Data Fasting: yes Labs: yes Today's  Visit #: 38 Starting Date: 01/18/20     Objective:   PHYSICAL EXAM: Blood pressure (!) 148/68, pulse 62, temperature 98.2 F (36.8 C), height 5\' 11"  (1.803 m), weight 278 lb (126.1 kg), SpO2 96%. Body mass index is 38.77 kg/m.  General: Well Developed, well nourished, and in no acute distress.  HEENT: Normocephalic, atraumatic Skin: Warm and dry, cap RF less 2 sec, good turgor Chest:  Normal excursion, shape, no gross abn Respiratory: speaking in full sentences, no conversational dyspnea NeuroM-Sk: Ambulates w/o assistance, moves * 4 Psych: A and O *3, insight good, mood-full  DIAGNOSTIC DATA REVIEWED:  BMET    Component Value Date/Time   NA 140 07/03/2022 0000   K 4.1 07/03/2022 0000   CL 105 07/03/2022 0000   CO2 30 (A) 07/03/2022 0000   GLUCOSE 102 (H) 12/22/2021 0820   GLUCOSE 96 04/04/2021 0107   BUN 20 07/03/2022 0000   CREATININE 1.3 07/03/2022 0000   CREATININE 1.38 (H) 12/22/2021 0820   CALCIUM 9.6 07/03/2022 0000   GFRNONAA 50 (L) 04/04/2021 0107   GFRAA 79 01/18/2020 1535   Lab Results  Component Value Date   HGBA1C 6.0 07/03/2022   HGBA1C 6.2 (H) 01/18/2020   Lab Results  Component Value Date   INSULIN 22.7 12/22/2021   INSULIN 27.9 (H) 01/18/2020   Lab Results  Component Value Date   TSH 2.040 01/18/2020   CBC    Component Value Date/Time   WBC 9.1 04/04/2021 0107   RBC 5.13 04/04/2021 0107   HGB 14.6 04/04/2021 0107   HGB 13.0 11/06/2020 1107   HCT 44.7 04/04/2021 0107   HCT 40.3 11/06/2020 1107   PLT 221 04/04/2021 0107   PLT 174 11/06/2020 1107   MCV 87.1 04/04/2021 0107   MCV 85 11/06/2020 1107   MCH 28.5 04/04/2021 0107   MCHC 32.7 04/04/2021 0107   RDW 14.0 04/04/2021 0107   RDW 14.1 11/06/2020 1107   Iron Studies No results found for: "IRON", "TIBC", "FERRITIN", "IRONPCTSAT" Lipid Panel     Component Value Date/Time   CHOL 151 06/30/2022 0710   TRIG 98 06/30/2022 0710   HDL 59 06/30/2022 0710   CHOLHDL 2.6 06/30/2022  0710   CHOLHDL 2.7 05/04/2016 0736   VLDL 14 05/04/2016 0736   LDLCALC 74 06/30/2022 0710   Hepatic Function Panel     Component Value Date/Time   PROT 7.8  12/22/2021 0820   ALBUMIN 4.1 07/03/2022 0000   ALBUMIN 4.2 12/22/2021 0820   AST 16 07/03/2022 0000   ALT 12 07/03/2022 0000   ALKPHOS 64 12/22/2021 0820   BILITOT 0.3 12/22/2021 0820   BILIDIR 0.09 01/13/2019 0845   IBILI 0.2 05/04/2016 0736      Component Value Date/Time   TSH 2.040 01/18/2020 1535   Nutritional Lab Results  Component Value Date   VD25OH 46.3 07/03/2022   VD25OH 48.1 12/22/2021   VD25OH 38.3 07/02/2021    Attestations:   I, Clinical biochemist, acting as a Stage manager for [employer], DO., have compiled all relevant documentation for today's office visit on behalf of Thomasene Lot, DO, while in the presence of [employer], DO.  I have reviewed the above documentation for accuracy and completeness, and I agree with the above. [name], D.O.  The 21st Century Cures Act was signed into law in 2016 which includes the topic of electronic health records.  This provides immediate access to information in MyChart.  This includes consultation notes, operative notes, office notes, lab results and pathology reports.  If you have any questions about what you read please let us know at your next visit so we can discuss your concerns and take corrective action if need be.  We are right here with you.

## 2023-05-03 NOTE — Patient Instructions (Addendum)
Medication Instructions:  Your physician recommends that you continue on your current medications as directed. Please refer to the Current Medication list given to you today.  *If you need a refill on your cardiac medications before your next appointment, please call your pharmacy*   Lab Work: None ordered   If you have labs (blood work) drawn today and your tests are completely normal, you will receive your results only by: MyChart Message (if you have MyChart) OR A paper copy in the mail If you have any lab test that is abnormal or we need to change your treatment, we will call you to review the results.   Testing/Procedures: None ordered    Follow-Up: At [facility], you and your health needs are our priority.  As part of our continuing mission to provide you with exceptional heart care, we have created designated Provider Care Teams.  These Care Teams include your primary Cardiologist (physician) and Advanced Practice Providers (APPs -  Physician Assistants and Nurse Practitioners) who all work together to provide you with the care you need, when you need it.  We recommend signing up for the patient portal called "MyChart".  Sign up information is provided on this After Visit Summary.  MyChart is used to connect with patients for Virtual Visits (Telemedicine).  Patients are able to view lab/test results, encounter notes, upcoming appointments, etc.  Non-urgent messages can be sent to your provider as well.   To learn more about what you can do with MyChart, go to [email or web address].    Your next appointment:   12 month(s)  Provider:   Armanda Magic, MD     Other Instructions  Heart-Healthy Eating Plan Many factors influence your heart health, including eating and exercise habits. Heart health is also called coronary health. Coronary risk increases with abnormal blood fat (lipid) levels. A heart-healthy eating plan includes limiting unhealthy fats, increasing healthy  fats, limiting salt (sodium) intake, and making other diet and lifestyle changes. What is my plan? Your health care provider may recommend that: You limit your fat intake to _________% or less of your total calories each day. You limit your saturated fat intake to _________% or less of your total calories each day. You limit the amount of cholesterol in your diet to less than _________ mg per day. You limit the amount of sodium in your diet to less than _________ mg per day. What are tips for following this plan? Cooking Cook foods using methods other than frying. Baking, boiling, grilling, and broiling are all good options. Other ways to reduce fat include: Removing the skin from poultry. Removing all visible fats from meats. Steaming vegetables in water or broth. Meal planning  At meals, imagine dividing your plate into fourths: Fill one-half of your plate with vegetables and green salads. Fill one-fourth of your plate with whole grains. Fill one-fourth of your plate with lean protein foods. Eat 2-4 cups of vegetables per day. One cup of vegetables equals 1 cup (91 g) broccoli or cauliflower florets, 2 medium carrots, 1 large bell pepper, 1 large sweet potato, 1 large tomato, 1 medium white potato, 2 cups (150 g) raw leafy greens. Eat 1-2 cups of fruit per day. One cup of fruit equals 1 small apple, 1 large banana, 1 cup (237 g) mixed fruit, 1 large orange,  cup (82 g) dried fruit, 1 cup (240 mL) 100% fruit juice. Eat more foods that contain soluble fiber. Examples include apples, broccoli, carrots, beans, peas, and barley. Aim  to get 25-30 g of fiber per day. Increase your consumption of legumes, nuts, and seeds to 4-5 servings per week. One serving of dried beans or legumes equals  cup (90 g) cooked, 1 serving of nuts is  oz (12 almonds, 24 pistachios, or 7 walnut halves), and 1 serving of seeds equals  oz (8 g). Fats Choose healthy fats more often. Choose monounsaturated and  polyunsaturated fats, such as olive and canola oils, avocado oil, flaxseeds, walnuts, almonds, and seeds. Eat more omega-3 fats. Choose salmon, mackerel, sardines, tuna, flaxseed oil, and ground flaxseeds. Aim to eat fish at least 2 times each week. Check food labels carefully to identify foods with trans fats or high amounts of saturated fat. Limit saturated fats. These are found in animal products, such as meats, butter, and cream. Plant sources of saturated fats include palm oil, palm kernel oil, and coconut oil. Avoid foods with partially hydrogenated oils in them. These contain trans fats. Examples are stick margarine, some tub margarines, cookies, crackers, and other baked goods. Avoid fried foods. General information Eat more home-cooked food and less restaurant, buffet, and fast food. Limit or avoid alcohol. Limit foods that are high in added sugar and simple starches such as foods made using white refined flour (white breads, pastries, sweets). Lose weight if you are overweight. Losing just 5-10% of your body weight can help your overall health and prevent diseases such as diabetes and heart disease. Monitor your sodium intake, especially if you have high blood pressure. Talk with your health care provider about your sodium intake. Try to incorporate more vegetarian meals weekly. What foods should I eat? Fruits All fresh, canned (in natural juice), or frozen fruits. Vegetables Fresh or frozen vegetables (raw, steamed, roasted, or grilled). Green salads. Grains Most grains. Choose whole wheat and whole grains most of the time. Rice and pasta, including brown rice and pastas made with whole wheat. Meats and other proteins Lean, well-trimmed beef, veal, pork, and lamb. Chicken and [place] without skin. All fish and shellfish. Wild duck, rabbit, pheasant, and venison. Egg whites or low-cholesterol egg substitutes. Dried beans, peas, lentils, and tofu. Seeds and most nuts. Dairy Low-fat or  nonfat cheeses, including ricotta and mozzarella. Skim or 1% milk (liquid, powdered, or evaporated). Buttermilk made with low-fat milk. Nonfat or low-fat yogurt. Fats and oils Non-hydrogenated (trans-free) margarines. Vegetable oils, including soybean, sesame, sunflower, olive, avocado, peanut, safflower, corn, canola, and cottonseed. Salad dressings or mayonnaise made with a vegetable oil. Beverages Water (mineral or sparkling). Coffee and tea. Unsweetened ice tea. Diet beverages. Sweets and desserts Sherbet, gelatin, and fruit ice. Small amounts of dark chocolate. Limit all sweets and desserts. Seasonings and condiments All seasonings and condiments. The items listed above may not be a complete list of foods and beverages you can eat. Contact a dietitian for more options. What foods should I avoid? Fruits Canned fruit in heavy syrup. Fruit in cream or butter sauce. Fried fruit. Limit coconut. Vegetables Vegetables cooked in cheese, cream, or butter sauce. Fried vegetables. Grains Breads made with saturated or trans fats, oils, or whole milk. Croissants. Sweet rolls. Donuts. High-fat crackers, such as cheese crackers and chips. Meats and other proteins Fatty meats, such as hot dogs, ribs, sausage, bacon, rib-eye roast or steak. High-fat deli meats, such as salami and bologna. Caviar. Domestic duck and goose. Organ meats, such as liver. Dairy Cream, sour cream, cream cheese, and creamed cottage cheese. Whole-milk cheeses. Whole or 2% milk (liquid, evaporated, or condensed). Whole buttermilk.  Cream sauce or high-fat cheese sauce. Whole-milk yogurt. Fats and oils Meat fat, or shortening. Cocoa butter, hydrogenated oils, palm oil, coconut oil, palm kernel oil. Solid fats and shortenings, including bacon fat, salt pork, lard, and butter. Nondairy cream substitutes. Salad dressings with cheese or sour cream. Beverages Regular sodas and any drinks with added sugar. Sweets and desserts Frosting.  Pudding. Cookies. Cakes. Pies. Milk chocolate or white chocolate. Buttered syrups. Full-fat ice cream or ice cream drinks. The items listed above may not be a complete list of foods and beverages to avoid. Contact a dietitian for more information. Summary Heart-healthy meal planning includes limiting unhealthy fats, increasing healthy fats, limiting salt (sodium) intake and making other diet and lifestyle changes. Lose weight if you are overweight. Losing just 5-10% of your body weight can help your overall health and prevent diseases such as diabetes and heart disease. Focus on eating a balance of foods, including fruits and vegetables, low-fat or nonfat dairy, lean protein, nuts and legumes, whole grains, and heart-healthy oils and fats. This information is not intended to replace advice given to you by your health care provider. Make sure you discuss any questions you have with your health care provider. Document Revised: 07/28/2021 Document Reviewed: 07/28/2021 Elsevier Patient Education  2024 [employer] Low-Sodium Eating Plan Salt (sodium) helps you keep a healthy balance of fluids in your body. Too much sodium can raise your blood pressure. It can also cause fluid and waste to be held in your body. Your health care provider or dietitian may recommend a low-sodium eating plan if you have high blood pressure (hypertension), kidney disease, liver disease, or heart failure. Eating less sodium can help lower your blood pressure and reduce swelling. It can also protect your heart, liver, and kidneys. What are tips for following this plan? Reading food labels  Check food labels for the amount of sodium per serving. If you eat more than one serving, you must multiply the listed amount by the number of servings. Choose foods with less than 140 milligrams (mg) of sodium per serving. Avoid foods with 300 mg of sodium or more per serving. Always check how much sodium is in a product, even if the  label says "unsalted" or "no salt added." Shopping  Buy products labeled as "low-sodium" or "no salt added." Buy fresh foods. Avoid canned foods and pre-made or frozen meals. Avoid canned, cured, or processed meats. Buy breads that have less than 80 mg of sodium per slice. Cooking  Eat more home-cooked food. Try to eat less restaurant, buffet, and fast food. Try not to add salt when you cook. Use salt-free seasonings or herbs instead of table salt or sea salt. Check with your provider or pharmacist before using salt substitutes. Cook with plant-based oils, such as canola, sunflower, or olive oil. Meal planning When eating at a restaurant, ask if your food can be made with less salt or no salt. Avoid dishes labeled as brined, pickled, cured, or smoked. Avoid dishes made with soy sauce, miso, or teriyaki sauce. Avoid foods that have monosodium glutamate (MSG) in them. MSG may be added to some restaurant food, sauces, soups, bouillon, and canned foods. Make meals that can be grilled, baked, poached, roasted, or steamed. These are often made with less sodium. General information Try to limit your sodium intake to 1,500-2,300 mg each day, or the amount told by your provider. What foods should I eat? Fruits Fresh, frozen, or canned fruit. Fruit juice. Vegetables Fresh or frozen vegetables. "  No salt added" canned vegetables. "No salt added" tomato sauce and paste. Low-sodium or reduced-sodium tomato and vegetable juice. Grains Low-sodium cereals, such as oats, puffed wheat and rice, and shredded wheat. Low-sodium crackers. Unsalted rice. Unsalted pasta. Low-sodium bread. Whole grain breads and whole grain pasta. Meats and other proteins Fresh or frozen meat, poultry, seafood, and fish. These should have no added salt. Low-sodium canned tuna and salmon. Unsalted nuts. Dried peas, beans, and lentils without added salt. Unsalted canned beans. Eggs. Unsalted nut butters. Dairy Milk. Soy milk.  Cheese that is naturally low in sodium, such as ricotta cheese, fresh mozzarella, or Swiss cheese. Low-sodium or reduced-sodium cheese. Cream cheese. Yogurt. Seasonings and condiments Fresh and dried herbs and spices. Salt-free seasonings. Low-sodium mustard and ketchup. Sodium-free salad dressing. Sodium-free light mayonnaise. Fresh or refrigerated horseradish. Lemon juice. Vinegar. Other foods Homemade, reduced-sodium, or low-sodium soups. Unsalted popcorn and pretzels. Low-salt or salt-free chips. The items listed above may not be all the foods and drinks you can have. Talk to a dietitian to learn more. What foods should I avoid? Vegetables Sauerkraut, pickled vegetables, and relishes. Olives. [place] fries. Onion rings. Regular canned vegetables, except low-sodium or reduced-sodium items. Regular canned tomato sauce and paste. Regular tomato and vegetable juice. Frozen vegetables in sauces. Grains Instant hot cereals. Bread stuffing, pancake, and biscuit mixes. Croutons. Seasoned rice or pasta mixes. Noodle soup cups. Boxed or frozen macaroni and cheese. Regular salted crackers. Self-rising flour. Meats and other proteins Meat or fish that is salted, canned, smoked, spiced, or pickled. Precooked or cured meat, such as sausages or meat loaves. Tomasa Blase. Ham. Pepperoni. Hot dogs. Corned beef. Chipped beef. Salt pork. Jerky. Pickled herring, anchovies, and sardines. Regular canned tuna. Salted nuts. Dairy Processed cheese and cheese spreads. Hard cheeses. Cheese curds. Blue cheese. Feta cheese. String cheese. Regular cottage cheese. Buttermilk. Canned milk. Fats and oils Salted butter. Regular margarine. Ghee. Bacon fat. Seasonings and condiments Onion salt, garlic salt, seasoned salt, table salt, and sea salt. Canned and packaged gravies. Worcestershire sauce. Tartar sauce. Barbecue sauce. Teriyaki sauce. Soy sauce, including reduced-sodium soy sauce. Steak sauce. Fish sauce. Oyster sauce. Cocktail  sauce. Horseradish that you find on the shelf. Regular ketchup and mustard. Meat flavorings and tenderizers. Bouillon cubes. Hot sauce. Pre-made or packaged marinades. Pre-made or packaged taco seasonings. Relishes. Regular salad dressings. Salsa. Other foods Salted popcorn and pretzels. Corn chips and puffs. Potato and tortilla chips. Canned or dried soups. Pizza. Frozen entrees and pot pies. The items listed above may not be all the foods and drinks you should avoid. Talk to a dietitian to learn more. This information is not intended to replace advice given to you by your health care provider. Make sure you discuss any questions you have with your health care provider. Document Revised: 07/09/2022 Document Reviewed: 07/09/2022 Elsevier Patient Education  2024 [employer].

## 2023-05-04 LAB — CBC WITH DIFFERENTIAL/PLATELET
Basophils Absolute: 0 10*3/uL (ref 0.0–0.2)
Basos: 0 %
EOS (ABSOLUTE): 0.1 10*3/uL (ref 0.0–0.4)
Eos: 2 %
Hematocrit: 38.7 % (ref 37.5–51.0)
Hemoglobin: 12.6 g/dL — ABNORMAL LOW (ref 13.0–17.7)
Immature Grans (Abs): 0 10*3/uL (ref 0.0–0.1)
Immature Granulocytes: 0 %
Lymphocytes Absolute: 2.1 10*3/uL (ref 0.7–3.1)
Lymphs: 38 %
MCH: 28.3 pg (ref 26.6–33.0)
MCHC: 32.6 g/dL (ref 31.5–35.7)
MCV: 87 fL (ref 79–97)
Monocytes Absolute: 0.6 10*3/uL (ref 0.1–0.9)
Monocytes: 11 %
Neutrophils Absolute: 2.7 10*3/uL (ref 1.4–7.0)
Neutrophils: 49 %
Platelets: 202 10*3/uL (ref 150–450)
RBC: 4.45 x10E6/uL (ref 4.14–5.80)
RDW: 13.8 % (ref 11.6–15.4)
WBC: 5.5 10*3/uL (ref 3.4–10.8)

## 2023-05-04 LAB — HEMOGLOBIN A1C
Est. average glucose Bld gHb Est-mCnc: 128 mg/dL
Hgb A1c MFr Bld: 6.1 % — ABNORMAL HIGH (ref 4.8–5.6)

## 2023-05-04 LAB — COMPREHENSIVE METABOLIC PANEL
ALT: 15 [IU]/L (ref 0–44)
AST: 23 [IU]/L (ref 0–40)
Albumin: 4 g/dL (ref 3.8–4.9)
Alkaline Phosphatase: 46 [IU]/L (ref 44–121)
BUN/Creatinine Ratio: 14 (ref 9–20)
BUN: 18 mg/dL (ref 6–24)
Bilirubin Total: 0.2 mg/dL (ref 0.0–1.2)
CO2: 25 mmol/L (ref 20–29)
Calcium: 9 mg/dL (ref 8.7–10.2)
Chloride: 104 mmol/L (ref 96–106)
Creatinine, Ser: 1.32 mg/dL — ABNORMAL HIGH (ref 0.76–1.27)
Globulin, Total: 3.6 g/dL (ref 1.5–4.5)
Glucose: 96 mg/dL (ref 70–99)
Potassium: 3.7 mmol/L (ref 3.5–5.2)
Sodium: 139 mmol/L (ref 134–144)
Total Protein: 7.6 g/dL (ref 6.0–8.5)
eGFR: 62 mL/min/{1.73_m2} (ref >59)

## 2023-05-04 LAB — LIPID PANEL WITH LDL/HDL RATIO
Cholesterol, Total: 127 mg/dL (ref 100–199)
HDL: 46 mg/dL (ref >39)
LDL Chol Calc (NIH): 64 mg/dL (ref 0–99)
LDL/HDL Ratio: 1.4 ratio (ref 0.0–3.6)
Triglycerides: 90 mg/dL (ref 0–149)
VLDL Cholesterol Cal: 17 mg/dL (ref 5–40)

## 2023-05-04 LAB — VITAMIN D 25 HYDROXY (VIT D DEFICIENCY, FRACTURES): Vit D, 25-Hydroxy: 80.9 ng/mL (ref 30.0–100.0)

## 2023-05-04 LAB — INSULIN, RANDOM: INSULIN: 24.8 u[IU]/mL (ref 2.6–24.9)

## 2023-05-04 LAB — VITAMIN B12: Vitamin B-12: 672 pg/mL (ref 232–1245)

## 2023-05-04 LAB — FOLATE: Folate: >20 ng/mL (ref >3.0)

## 2023-05-04 LAB — T4, FREE: Free T4: 1.2 ng/dL (ref 0.82–1.77)

## 2023-05-04 LAB — TSH: TSH: 1.46 u[IU]/mL (ref 0.450–4.500)

## 2023-05-31 ENCOUNTER — Other Ambulatory Visit (INDEPENDENT_AMBULATORY_CARE_PROVIDER_SITE_OTHER): Payer: Self-pay | Admitting: Family Medicine

## 2023-05-31 DIAGNOSIS — R7303 Prediabetes: Secondary | ICD-10-CM

## 2023-06-02 ENCOUNTER — Other Ambulatory Visit (INDEPENDENT_AMBULATORY_CARE_PROVIDER_SITE_OTHER): Payer: Self-pay

## 2023-06-02 ENCOUNTER — Encounter (INDEPENDENT_AMBULATORY_CARE_PROVIDER_SITE_OTHER): Payer: Self-pay | Admitting: Family Medicine

## 2023-06-02 DIAGNOSIS — R7303 Prediabetes: Secondary | ICD-10-CM

## 2023-06-02 MED ORDER — METFORMIN HCL 500 MG PO TABS: 500.0000 mg | ORAL_TABLET | Freq: Two times a day (BID) | ORAL | 0 refills | Status: DC

## 2023-06-09 ENCOUNTER — Ambulatory Visit (INDEPENDENT_AMBULATORY_CARE_PROVIDER_SITE_OTHER): Payer: 59 | Admitting: Family Medicine

## 2023-06-09 ENCOUNTER — Encounter (INDEPENDENT_AMBULATORY_CARE_PROVIDER_SITE_OTHER): Payer: Self-pay | Admitting: Family Medicine

## 2023-06-09 VITALS — BP 142/85 | HR 57 | Temp 98.5°F | Ht 71.0 in | Wt 278.0 lb

## 2023-06-09 DIAGNOSIS — I10 Essential (primary) hypertension: Secondary | ICD-10-CM

## 2023-06-09 DIAGNOSIS — E559 Vitamin D deficiency, unspecified: Secondary | ICD-10-CM

## 2023-06-09 DIAGNOSIS — Z6838 Body mass index (BMI) 38.0-38.9, adult: Secondary | ICD-10-CM

## 2023-06-09 DIAGNOSIS — R7303 Prediabetes: Secondary | ICD-10-CM | POA: Diagnosis not present

## 2023-06-09 DIAGNOSIS — E669 Obesity, unspecified: Secondary | ICD-10-CM | POA: Diagnosis not present

## 2023-06-09 DIAGNOSIS — Z6839 Body mass index (BMI) 39.0-39.9, adult: Secondary | ICD-10-CM

## 2023-06-09 MED ORDER — METFORMIN HCL 500 MG PO TABS: 500.0000 mg | ORAL_TABLET | Freq: Two times a day (BID) | ORAL | 1 refills | Status: DC

## 2023-06-09 MED ORDER — VITAMIN D (ERGOCALCIFEROL) 1.25 MG (50000 UNIT) PO CAPS: ORAL_CAPSULE | ORAL | 0 refills | Status: DC

## 2023-06-09 NOTE — Progress Notes (Signed)
Raymond Jordan, D.O.  ABFM, ABOM Specializing in Clinical Bariatric Medicine  Office located at: [address] [place], [place]  [address]   Assessment and Plan:   FOR THE DISEASE OF OBESITY: BMI 39.0-39.9,adult-current BMI 38.79 Obesity, Beginning BMI 40.73 Since last office visit on 05/03/2023 patient's  Muscle mass has decreased by 1lb. Fat mass has increased by 1.2lb. Total body water has increased by 0.8lb.  Counseling done on how various foods will affect these numbers and how to maximize success  Total lbs lost to date: 14 Total weight loss percentage to date: 4.79%   Recommended Dietary Goals [name] is currently in the action stage of change. As such, his goal is to continue weight management plan.  He has agreed to: continue current plan  - I recommended various Keto breads for him to use when he makes philly cheese steaks and other sandwiches.   - We had a discussion on what Keto, low carb, complex carbs, and saturated fats are.   - Total carbs - Fiber = Net Carbs  - I advised pt to go to the American Heart Association and American Diabetes Association website to do research on cholesterol and diabetes.   Behavioral Intervention We discussed the following today: increasing lean protein intake to established goals, decreasing simple carbohydrates , increasing fiber rich foods, reading food labels , continue to work on implementation of reduced calorie nutritional plan, continue to practice mindfulness when eating, planning for success, and focusing on food with a 10:1 ratio of calories: grams of protein  Additional resources provided today: None  Evidence-based interventions for health behavior change were utilized today including the discussion of self monitoring techniques, problem-solving barriers and SMART goal setting techniques.   Regarding patient's less desirable eating habits and patterns, we employed the technique of small changes.   Pt will  specifically work on: Pay attention to hunger and cravings and see if this correlates with times he eats off plan and Move More for next visit.    Recommended Physical Activity Goals Keldan has been advised to work up to 150 minutes of moderate intensity aerobic activity a week and strengthening exercises 2-3 times per week for cardiovascular health, weight loss maintenance and preservation of muscle mass.   He has agreed to :  Think about enjoyable ways to increase daily physical activity and overcoming barriers to exercise and Increase physical activity in their day and reduce sedentary time (increase NEAT).   Pharmacotherapy We discussed various medication options to help [facility] with his weight loss efforts and we both agreed to : continue with nutritional and behavioral strategies and continue current anti-obesity medication regimen   FOR ASSOCIATED CONDITIONS ADDRESSED TODAY: Prediabetes Assessment:  Condition is Not at goal.. Labs were reviewed. Pt A1c has increased form 6.0 to 6.1 and his insulin level has also increased from 22.7 to 24.8 as of 05/03/2023. This is being treated with Metformin which he tolerates well without any GI upset. He denies need for a dosage increase. He endorses his hunger and cravings are semi controlled, this is mainly controlled until he is in high-stress situations. His CBC is otherwise normal with a mildly low hemoglobin of 12.6 and his CMP is other wise normal other than a elevated creatinine of 1.32 with a BUN of 18. His thyroid and cholesterol levels are with normal range.  Lab Results  Component Value Date   HGBA1C 6.1 (H) 05/03/2023   HGBA1C 6.0 07/03/2022   HGBA1C 5.8 (H) 12/22/2021  INSULIN 24.8 05/03/2023   INSULIN 22.7 12/22/2021   INSULIN 23.8 07/02/2021   Lab Results  Component Value Date   WBC 5.5 05/03/2023   HGB 12.6 (L) 05/03/2023   HCT 38.7 05/03/2023   MCV 87 05/03/2023   PLT 202 05/03/2023   Lab Results  Component Value Date    CREATININE 1.32 (H) 05/03/2023   BUN 18 05/03/2023   NA 139 05/03/2023   K 3.7 05/03/2023   CL 104 05/03/2023   CO2 25 05/03/2023      Component Value Date/Time   PROT 7.6 05/03/2023 0755   ALBUMIN 4.0 05/03/2023 0755   AST 23 05/03/2023 0755   ALT 15 05/03/2023 0755   ALKPHOS 46 05/03/2023 0755   BILITOT 0.2 05/03/2023 0755   BILIDIR 0.09 01/13/2019 0845   IBILI 0.2 05/04/2016 0736   Lab Results  Component Value Date   TSH 1.460 05/03/2023   T3TOTAL 123 01/18/2020      Component Value Date/Time   CHOL 127 05/03/2023 0755   TRIG 90 05/03/2023 0755   HDL 46 05/03/2023 0755   CHOLHDL 2.6 06/30/2022 0710   CHOLHDL 2.7 05/04/2016 0736   VLDL 14 05/04/2016 0736   LDLCALC 64 05/03/2023 0755   LABVLDL 17 05/03/2023 0755   Plan:  Extensive discussion was had with patient regarding how the foods that they eat affect their labs. Continue Metformin at current dose as he denies need for increase in dosage. I will refill this today. Continue to decrease simple carbs/ sugars; increase fiber and proteins. I re-explained role of simple carbs and insulin levels on hunger and cravings. Anticipatory guidance given. We will recheck A1c and fasting insulin level in approximately 3 months from last check, or as deemed appropriate. Unless pre-existing renal or cardiopulmonary conditions exist which patient was told to limit their fluid intake by another provider, I recommended roughly one half of their weight in pounds, to be the approximate ounces of non-caloric, non-caffeinated beverages they should drink per day; including more if they are engaging in exercise. I reminded pt that he is at high risk for kidney diseases.  Prediabetes -     metFORMIN HCl; Take 1 tablet (500 mg total) by mouth 2 (two) times daily with a meal.  Dispense: 60 tablet; Refill: 1   Vitamin D deficiency Assessment:  Condition is Controlled.. Labs were reviewed. Pt vitamin D level has improved from 46.3 to 80.9 as of  05/03/2023. This is being treated with Ergocalciferol 50K IU weekly without difficulty. His folate and B-12 level is also within optimal range.  Lab Results  Component Value Date   VD25OH 80.9 05/03/2023   VD25OH 46.3 07/03/2022   VD25OH 48.1 12/22/2021   Lab Results  Component Value Date   VITAMINB12 672 05/03/2023   Component Ref Range & Units 1 mo ago  Folate >3.0 ng/mL >20.0   Plan:  Switch Ergocalciferol 50K IU to taking this every 10 days since his vitamin D is within the high normal range. Ideal vitamin D levels reviewed with patient. I discussed the importance of vitamin D to the patient's health and well-being as well as to their ability to lose weight.  Ideal vitamin D levels reviewed with patient  I informed pt that weight loss will likely improve availability of vitamin D, thus encouraged Sushant to continue with meal plan and their weight loss efforts to further improve this condition.  Vitamin D deficiency -     Vitamin D (Ergocalciferol); One Cap every 10  days  Dispense: 10 capsule; Refill: 0   Essential hypertension-  with white coat syndrome Assessment: Condition is Not optimized.Marland Kitchen Pt BP is elevated today at 142/85. Pt was previously monitoring his BP once daily before his last OV and now checks it once weekly. He notes that his BP at ome is always controlled.   Last 3 blood pressure readings in our office are as follows: BP Readings from Last 3 Encounters:  06/09/23 (!) 142/85  05/03/23 128/80  05/03/23 (!) 148/68    Plan: Begin monitoring his BP once daily. Lifestyle changes such as following our low salt, heart healthy meal plan and engaging in a regular exercise program discussed. Avoid buying foods that are: processed, frozen, or prepackaged to avoid excess salt. We will continue to monitor closely alongside PCP/ specialists.  Pt reminded to also f/up with those individuals as instructed by them.    Follow up:   Return in about 4 weeks (around 07/07/2023). He  was informed of the importance of frequent follow up visits to maximize his success with intensive lifestyle modifications for his multiple health conditions.  Subjective:   Chief complaint: Obesity Raymond Jordan is here to discuss his progress with his obesity treatment plan. He is on the the Category 3 Plan and states he is following his eating plan approximately 80% of the time. He states he is not exercising.  Interval History:  Deaunte Rubinstein. is here for a follow up office visit. Since last OV,  he has been well. He notes not eating the best over the Holidays. He informed me that he has made philly cheese steaks and would like nutritional information on how to make this healthier. He notes that he still needs to work on his water intake as he knows he is lacking. He also drinks coffee.   Barriers identified: having difficulty preparing healthy meals.   Pharmacotherapy for weight loss: He is currently taking Metformin (off label use for incretin effect and / or insulin resistance and / or diabetes prevention) with adequate clinical response  and without side effects..   Review of Systems:  Pertinent positives were addressed with patient today.  Reviewed by clinician on day of visit: allergies, medications, problem list, medical history, surgical history, family history, social history, and previous encounter notes.  Weight Summary and Biometrics   Weight Lost Since Last Visit: 0lb  Weight Gained Since Last Visit: 0lb    Vitals Temp: 98.5 F (36.9 C) BP: (!) 142/85 Pulse Rate: (!) 57 SpO2: 99 %   Anthropometric Measurements Height: 5\' 11"  (1.803 m) Weight: 278 lb (126.1 kg) BMI (Calculated): 38.79 Weight at Last Visit: 278lb Weight Lost Since Last Visit: 0lb Weight Gained Since Last Visit: 0lb Starting Weight: 292lb Total Weight Loss (lbs): 14 lb (6.35 kg)   Body Composition  Body Fat %: 37.5 % Fat Mass (lbs): 104.4 lbs Muscle Mass (lbs): 165.6 lbs Total Body Water  (lbs): 118.4 lbs Visceral Fat Rating : 23   Other Clinical Data Fasting: no Labs: no Today's Visit #: 39 Starting Date: 01/18/20    Objective:   PHYSICAL EXAM: Blood pressure (!) 142/85, pulse (!) 57, temperature 98.5 F (36.9 C), height 5\' 11"  (1.803 m), weight 278 lb (126.1 kg), SpO2 99%. Body mass index is 38.77 kg/m.  General: he is overweight, cooperative and in no acute distress. PSYCH: Has normal mood, affect and thought process.   HEENT: EOMI, sclerae are anicteric. Lungs: Normal breathing effort, no conversational dyspnea. Extremities: Moves *  4 Neurologic: A and O * 3, good insight  DIAGNOSTIC DATA REVIEWED: BMET    Component Value Date/Time   NA 139 05/03/2023 0755   K 3.7 05/03/2023 0755   CL 104 05/03/2023 0755   CO2 25 05/03/2023 0755   GLUCOSE 96 05/03/2023 0755   GLUCOSE 96 04/04/2021 0107   BUN 18 05/03/2023 0755   CREATININE 1.32 (H) 05/03/2023 0755   CALCIUM 9.0 05/03/2023 0755   GFRNONAA 50 (L) 04/04/2021 0107   GFRAA 79 01/18/2020 1535   Lab Results  Component Value Date   HGBA1C 6.1 (H) 05/03/2023   HGBA1C 6.2 (H) 01/18/2020   Lab Results  Component Value Date   INSULIN 24.8 05/03/2023   INSULIN 27.9 (H) 01/18/2020   Lab Results  Component Value Date   TSH 1.460 05/03/2023   CBC    Component Value Date/Time   WBC 5.5 05/03/2023 0755   WBC 9.1 04/04/2021 0107   RBC 4.45 05/03/2023 0755   RBC 5.13 04/04/2021 0107   HGB 12.6 (L) 05/03/2023 0755   HCT 38.7 05/03/2023 0755   PLT 202 05/03/2023 0755   MCV 87 05/03/2023 0755   MCH 28.3 05/03/2023 0755   MCH 28.5 04/04/2021 0107   MCHC 32.6 05/03/2023 0755   MCHC 32.7 04/04/2021 0107   RDW 13.8 05/03/2023 0755   Iron Studies No results found for: "IRON", "TIBC", "FERRITIN", "IRONPCTSAT" Lipid Panel     Component Value Date/Time   CHOL 127 05/03/2023 0755   TRIG 90 05/03/2023 0755   HDL 46 05/03/2023 0755   CHOLHDL 2.6 06/30/2022 0710   CHOLHDL 2.7 05/04/2016 0736   VLDL  14 05/04/2016 0736   LDLCALC 64 05/03/2023 0755   Hepatic Function Panel     Component Value Date/Time   PROT 7.6 05/03/2023 0755   ALBUMIN 4.0 05/03/2023 0755   AST 23 05/03/2023 0755   ALT 15 05/03/2023 0755   ALKPHOS 46 05/03/2023 0755   BILITOT 0.2 05/03/2023 0755   BILIDIR 0.09 01/13/2019 0845   IBILI 0.2 05/04/2016 0736      Component Value Date/Time   TSH 1.460 05/03/2023 0755   Nutritional Lab Results  Component Value Date   VD25OH 80.9 05/03/2023   VD25OH 46.3 07/03/2022   VD25OH 48.1 12/22/2021    Attestations:   I, Clinical biochemist, acting as a Stage manager for [employer], DO., have compiled all relevant documentation for today's office visit on behalf of Thomasene Lot, DO, while in the presence of [employer], DO.  Reviewed by clinician on day of visit: allergies, medications, problem list, medical history, surgical history, family history, social history, and previous encounter notes pertinent to patient's obesity diagnosis. I have spent 50 minutes in the care of the patient today including: preparing to see patient (e.g. review and interpretation of tests, old notes ), obtaining and/or reviewing separately obtained history, performing a medically appropriate examination or evaluation, counseling and educating the patient, ordering medications, test or procedures, documenting clinical information in the electronic or other health care record, and independently interpreting results and communicating results to the patient, family, or caregiver   I have reviewed the above documentation for accuracy and completeness, and I agree with the above. Raymond Jordan, D.O.  The 21st Century Cures Act was signed into law in 2016 which includes the topic of electronic health records.  This provides immediate access to information in MyChart.  This includes consultation notes, operative notes, office notes, lab results and pathology reports.  If  you have any  questions about what you read please let us know at your next visit so we can discuss your concerns and take corrective action if need be.  We are right here with you.

## 2023-06-28 ENCOUNTER — Other Ambulatory Visit (HOSPITAL_COMMUNITY): Payer: Self-pay

## 2023-07-26 ENCOUNTER — Encounter (INDEPENDENT_AMBULATORY_CARE_PROVIDER_SITE_OTHER): Payer: Self-pay | Admitting: Internal Medicine

## 2023-07-26 ENCOUNTER — Ambulatory Visit (INDEPENDENT_AMBULATORY_CARE_PROVIDER_SITE_OTHER): Payer: 59 | Admitting: Internal Medicine

## 2023-07-26 VITALS — BP 138/83 | HR 58 | Temp 98.1°F | Ht 71.0 in | Wt 271.0 lb

## 2023-07-26 DIAGNOSIS — R7303 Prediabetes: Secondary | ICD-10-CM

## 2023-07-26 DIAGNOSIS — I2510 Atherosclerotic heart disease of native coronary artery without angina pectoris: Secondary | ICD-10-CM

## 2023-07-26 DIAGNOSIS — E66812 Obesity, class 2: Secondary | ICD-10-CM | POA: Diagnosis not present

## 2023-07-26 DIAGNOSIS — G4733 Obstructive sleep apnea (adult) (pediatric): Secondary | ICD-10-CM | POA: Diagnosis not present

## 2023-07-26 DIAGNOSIS — Z6837 Body mass index (BMI) 37.0-37.9, adult: Secondary | ICD-10-CM

## 2023-07-26 MED ORDER — METFORMIN HCL 500 MG PO TABS: 500.0000 mg | ORAL_TABLET | Freq: Two times a day (BID) | ORAL | 1 refills | Status: DC

## 2023-07-26 NOTE — Assessment & Plan Note (Signed)
Most recent A1c is  Lab Results  Component Value Date   HGBA1C 6.1 (H) 05/03/2023   HGBA1C 6.2 (H) 01/18/2020    Patient aware of disease state and risk of progression. This may contribute to abnormal cravings, fatigue and diabetic complications without having diabetes.   We reviewed briefly carb insulin model of obesity.  We reviewed the importance of maintaining a diet with a low glycemic load.  He is currently on metformin for pharmacoprophylaxis without any adverse effects.  He will continue medication.  He is also a candidate for GLP-1 will discuss this further with Dr. Sharee Holster  He may also be a candidate for pharmacoprophylaxis with metformin or incretin mimetic.

## 2023-07-26 NOTE — Assessment & Plan Note (Signed)
Presents for medical weight management. Following reduced calorie nutrition plan 50% of the time and exercising 15 minutes twice per week. Lost 21 pounds since 2021, with fluctuations due to inconsistent adherence and medication coverage issues. 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 Demonstrated ability to lose weight but struggles with maintenance. Discussed importance of a consistent strategy involving nutrition, behavioral changes, physical activity, and medications. Emphasized sustainable approach to avoid weight fluctuations and increase muscle mass retention.  Explained that Zepbound could help with weight loss but requires long-term commitment.  - Continue reduced calorie nutrition plan - Increase protein intake to preserve muscle mass - Aim for three meals a day with high protein breakfast - Incorporate lean proteins such as eggs, dairy, shrimp, fish, chicken, and [place] - Avoid skipping meals to prevent muscle loss and metabolic rate reduction - Follow balanced plate model with three-quarters whole foods and one-quarter protein - Return in four weeks for follow-up with Dr. Molli Knock to discuss Zepbound
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
## 2023-07-26 NOTE — Assessment & Plan Note (Signed)
I reviewed sleep study from 2013 which showed an AHI of 40 consistent with severe OSA.  He is on PAP therapy.  Losing 15% of body weight may reduce AHI.  He may also benefit from Zepbound.  He will discuss this further with Dr. Sharee Holster
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
## 2023-08-21 ENCOUNTER — Other Ambulatory Visit (INDEPENDENT_AMBULATORY_CARE_PROVIDER_SITE_OTHER): Payer: Self-pay | Admitting: Family Medicine

## 2023-08-21 DIAGNOSIS — E559 Vitamin D deficiency, unspecified: Secondary | ICD-10-CM

## 2023-08-24 ENCOUNTER — Encounter (INDEPENDENT_AMBULATORY_CARE_PROVIDER_SITE_OTHER): Payer: Self-pay | Admitting: Family Medicine

## 2023-08-24 ENCOUNTER — Ambulatory Visit (INDEPENDENT_AMBULATORY_CARE_PROVIDER_SITE_OTHER): Payer: 59 | Admitting: Family Medicine

## 2023-08-24 VITALS — BP 132/73 | HR 57 | Temp 98.8°F | Ht 71.0 in | Wt 269.0 lb

## 2023-08-24 DIAGNOSIS — E669 Obesity, unspecified: Secondary | ICD-10-CM

## 2023-08-24 DIAGNOSIS — Z6837 Body mass index (BMI) 37.0-37.9, adult: Secondary | ICD-10-CM

## 2023-08-24 DIAGNOSIS — E559 Vitamin D deficiency, unspecified: Secondary | ICD-10-CM | POA: Diagnosis not present

## 2023-08-24 DIAGNOSIS — R7303 Prediabetes: Secondary | ICD-10-CM | POA: Diagnosis not present

## 2023-08-24 DIAGNOSIS — G4733 Obstructive sleep apnea (adult) (pediatric): Secondary | ICD-10-CM | POA: Diagnosis not present
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
## 2023-08-25 ENCOUNTER — Telehealth: Payer: Self-pay | Admitting: Pharmacy Technician

## 2023-08-25 ENCOUNTER — Other Ambulatory Visit (HOSPITAL_COMMUNITY): Payer: Self-pay

## 2023-08-25 NOTE — Telephone Encounter (Signed)
Pharmacy Patient Advocate Encounter   Received notification from CoverMyMeds that prior authorization for nexlizet is required/requested.   Insurance verification completed.   The patient is insured through [facility] .   Per test claim: PA required; PA submitted to above mentioned insurance via CoverMyMeds Key/confirmation #/EOC [facility] Status is pending

## 2023-08-25 NOTE — Telephone Encounter (Signed)
Pharmacy Patient Advocate Encounter  Received notification from [facility] that Prior Authorization for Nexlizet has been APPROVED from 08/25/23 to 08/24/24   PA #/Case ID/Reference #: [id number]

## 2023-08-30 ENCOUNTER — Other Ambulatory Visit (INDEPENDENT_AMBULATORY_CARE_PROVIDER_SITE_OTHER): Payer: Self-pay | Admitting: Family Medicine

## 2023-08-30 DIAGNOSIS — E559 Vitamin D deficiency, unspecified: Secondary | ICD-10-CM

## 2023-09-03 ENCOUNTER — Telehealth: Payer: Self-pay | Admitting: Cardiology

## 2023-09-03 ENCOUNTER — Other Ambulatory Visit (HOSPITAL_COMMUNITY): Payer: Self-pay

## 2023-09-03 ENCOUNTER — Telehealth: Payer: Self-pay | Admitting: Pharmacy Technician

## 2023-09-03 NOTE — Telephone Encounter (Signed)
 I called the patient back because I tried to get him a coupon and the coupon said he has a active one. I ran a test claim and he is just too soon until 09/12/23. I called the patient and he said he does have 2 bottles of medications so he will get them to run this once he gets closer to being out.

## 2023-09-03 NOTE — Telephone Encounter (Signed)
 Pt c/o medication issue:  1. Name of Medication: Bempedoic Acid-Ezetimibe (NEXLIZET) 180-10 MG TABS   2. How are you currently taking this medication (dosage and times per day)?   3. Are you having a reaction (difficulty breathing--STAT)?   4. What is your medication issue? Patient is requesting call back to discuss payment assistance for this medication. Please advise.

## 2023-09-29 ENCOUNTER — Telehealth (INDEPENDENT_AMBULATORY_CARE_PROVIDER_SITE_OTHER): Payer: Self-pay

## 2023-09-29 ENCOUNTER — Other Ambulatory Visit (HOSPITAL_COMMUNITY): Payer: Self-pay

## 2023-09-29 ENCOUNTER — Encounter (INDEPENDENT_AMBULATORY_CARE_PROVIDER_SITE_OTHER): Payer: Self-pay | Admitting: Family Medicine

## 2023-09-29 ENCOUNTER — Ambulatory Visit (INDEPENDENT_AMBULATORY_CARE_PROVIDER_SITE_OTHER): Payer: 59 | Admitting: Family Medicine

## 2023-09-29 VITALS — BP 128/79 | HR 72 | Temp 98.5°F | Ht 71.0 in | Wt 270.0 lb

## 2023-09-29 DIAGNOSIS — R7303 Prediabetes: Secondary | ICD-10-CM | POA: Diagnosis not present

## 2023-09-29 DIAGNOSIS — G4733 Obstructive sleep apnea (adult) (pediatric): Secondary | ICD-10-CM

## 2023-09-29 DIAGNOSIS — E669 Obesity, unspecified: Secondary | ICD-10-CM

## 2023-09-29 DIAGNOSIS — E559 Vitamin D deficiency, unspecified: Secondary | ICD-10-CM

## 2023-09-29 DIAGNOSIS — Z6837 Body mass index (BMI) 37.0-37.9, adult: Secondary | ICD-10-CM

## 2023-09-29 MED ORDER — METFORMIN HCL 500 MG PO TABS
500.0000 mg | ORAL_TABLET | Freq: Two times a day (BID) | ORAL | 1 refills | Status: DC
  Filled 2023-09-29: qty 60, 30d supply, fill #0

## 2023-09-29 MED ORDER — VITAMIN D (ERGOCALCIFEROL) 1.25 MG (50000 UNIT) PO CAPS
ORAL_CAPSULE | ORAL | 0 refills | Status: DC
  Filled 2023-09-29: qty 3, 30d supply, fill #0

## 2023-09-29 MED ORDER — TIRZEPATIDE 2.5 MG/0.5ML ~~LOC~~ SOAJ
2.5000 mg | SUBCUTANEOUS | 0 refills | Status: DC
  Filled 2023-09-29: qty 2, 28d supply, fill #0
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
## 2023-09-29 NOTE — Telephone Encounter (Signed)
 PA for Zepbound 2.5 has been submitted, awaiting PA questions.

## 2023-09-30 ENCOUNTER — Other Ambulatory Visit (HOSPITAL_COMMUNITY): Payer: Self-pay

## 2023-09-30 ENCOUNTER — Other Ambulatory Visit: Payer: Self-pay

## 2023-09-30 MED ORDER — TIRZEPATIDE-WEIGHT MANAGEMENT 2.5 MG/0.5ML ~~LOC~~ SOAJ
2.5000 mg | SUBCUTANEOUS | 0 refills | Status: DC
Start: 2023-09-30 — End: 2024-02-02
  Filled 2023-09-30: qty 2, 28d supply, fill #0

## 2023-09-30 NOTE — Telephone Encounter (Signed)
 PA questions for Zepbound 2.5  have been answered and all documentation has been included. Waiting on a determination.

## 2023-09-30 NOTE — Telephone Encounter (Signed)
 PA for Zepbound 2.5 has been denied. PA is now complete.      Message from Plan Request Reference Number: [id number]. ZEPBOUND INJ 2.5/0.5 is denied for not meeting the prior authorization requirement(s). Details of this decision are in the notice attached below or have been faxed to you.

## 2023-10-04 ENCOUNTER — Telehealth (INDEPENDENT_AMBULATORY_CARE_PROVIDER_SITE_OTHER): Payer: Self-pay | Admitting: Family Medicine

## 2023-10-04 NOTE — Telephone Encounter (Signed)
 The prior auth for zepbound is not covered because his plan does not cover glp-1s. Raymond Jordan is also not covered.  Raymond Jordan is an alternate if he meets req.  A1C of 6.5 or higher to qualify.

## 2023-10-06 ENCOUNTER — Encounter (INDEPENDENT_AMBULATORY_CARE_PROVIDER_SITE_OTHER): Payer: Self-pay

## 2023-10-06 ENCOUNTER — Other Ambulatory Visit (INDEPENDENT_AMBULATORY_CARE_PROVIDER_SITE_OTHER): Payer: Self-pay | Admitting: Internal Medicine

## 2023-10-06 ENCOUNTER — Telehealth (INDEPENDENT_AMBULATORY_CARE_PROVIDER_SITE_OTHER): Payer: Self-pay

## 2023-10-06 DIAGNOSIS — R7303 Prediabetes: Secondary | ICD-10-CM

## 2023-10-06 NOTE — Telephone Encounter (Signed)
 Called the patient to talk to him about his medication request, did not get an answer. Sent a message on Mychart.

## 2023-10-28 ENCOUNTER — Other Ambulatory Visit (HOSPITAL_COMMUNITY): Payer: Self-pay

## 2023-10-28 ENCOUNTER — Encounter: Payer: Self-pay | Admitting: Pharmacy Technician

## 2023-10-28 ENCOUNTER — Telehealth: Payer: Self-pay | Admitting: Pharmacy Technician

## 2023-10-28 ENCOUNTER — Telehealth: Payer: Self-pay | Admitting: Cardiology

## 2023-10-28 NOTE — Telephone Encounter (Signed)
 id: [id number] Group: [id number] Bin: [id number] Pcn: [id number] Got the card renewed with new information   I called the patients pharmacy [facility] and gave them the new coupon information. I called the patient and told him. I also sent him a mychart with the info

## 2023-10-28 NOTE — Telephone Encounter (Signed)
   Id: [id number] Group: [id number] Bin: [id number] Pcn: [id number] Got the card renewed with new information  I called the patients pharmacy [facility] and gave them the new coupon information. I called the patient and told him. I also sent him a mychart with the info

## 2023-10-28 NOTE — Telephone Encounter (Signed)
 Pt c/o medication issue:  1. Name of Medication: Bempedoic Acid-Ezetimibe  (NEXLIZET ) 180-10 MG TABS   2. How are you currently taking this medication (dosage and times per day)? As written   3. Are you having a reaction (difficulty breathing--STAT)? No   4. What is your medication issue? Pt called in asking if he can get another coupon for this medication. Please advise.

## 2023-11-01 ENCOUNTER — Other Ambulatory Visit (HOSPITAL_COMMUNITY): Payer: Self-pay

## 2023-11-01 NOTE — Telephone Encounter (Signed)
 Patient called and said [facility] having trouble with the reimbursement. I called and will follow up on it

## 2023-11-02 ENCOUNTER — Encounter (HOSPITAL_BASED_OUTPATIENT_CLINIC_OR_DEPARTMENT_OTHER): Payer: Self-pay | Admitting: Cardiology

## 2023-11-02 NOTE — Telephone Encounter (Signed)
 I called and they said this is still not worked on but it is in there as critical and in the que to be fixed. I will keep following up

## 2023-11-03 NOTE — Telephone Encounter (Signed)
 Called again and  this refund is still in process.

## 2023-11-04 ENCOUNTER — Encounter (INDEPENDENT_AMBULATORY_CARE_PROVIDER_SITE_OTHER): Payer: Self-pay | Admitting: Family Medicine

## 2023-11-04 ENCOUNTER — Ambulatory Visit (INDEPENDENT_AMBULATORY_CARE_PROVIDER_SITE_OTHER): Admitting: Family Medicine

## 2023-11-04 VITALS — BP 134/78 | HR 69 | Temp 98.2°F | Ht 71.0 in | Wt 271.0 lb

## 2023-11-04 DIAGNOSIS — R7303 Prediabetes: Secondary | ICD-10-CM

## 2023-11-04 DIAGNOSIS — E669 Obesity, unspecified: Secondary | ICD-10-CM

## 2023-11-04 DIAGNOSIS — Z6837 Body mass index (BMI) 37.0-37.9, adult: Secondary | ICD-10-CM

## 2023-11-04 DIAGNOSIS — I10 Essential (primary) hypertension: Secondary | ICD-10-CM | POA: Diagnosis not present

## 2023-11-04 DIAGNOSIS — E559 Vitamin D deficiency, unspecified: Secondary | ICD-10-CM | POA: Diagnosis not present

## 2023-11-04 MED ORDER — METFORMIN HCL 500 MG PO TABS
500.0000 mg | ORAL_TABLET | Freq: Two times a day (BID) | ORAL | 1 refills | Status: DC
Start: 2023-11-04 — End: 2024-02-02

## 2023-11-04 MED ORDER — VITAMIN D (ERGOCALCIFEROL) 1.25 MG (50000 UNIT) PO CAPS: ORAL_CAPSULE | ORAL | 0 refills | Status: DC

## 2023-11-04 NOTE — Progress Notes (Signed)
 Raymond Jordan, D.O.  ABFM, ABOM Specializing in Clinical Bariatric Medicine  Office located at: [address] [place], [place]  [address]   Assessment and Plan:   Orders Placed This Encounter  Procedures   Basic metabolic panel with GFR   Hemoglobin A1c   VITAMIN D  25 Hydroxy (Vit-D Deficiency, Fractures)    Medications Discontinued During This Encounter  Medication Reason   metFORMIN  (GLUCOPHAGE ) 500 MG tablet Reorder   Vitamin D , Ergocalciferol , (DRISDOL ) 1.25 MG (50000 UNIT) CAPS capsule Reorder     Meds ordered this encounter  Medications   metFORMIN  (GLUCOPHAGE ) 500 MG tablet    Sig: Take 1 tablet (500 mg total) by mouth 2 (two) times daily with a meal.    Dispense:  60 tablet    Refill:  1   Vitamin D , Ergocalciferol , (DRISDOL ) 1.25 MG (50000 UNIT) CAPS capsule    Sig: Take 1 capsule by mouth every 10 days as directed    Dispense:  9 capsule    Refill:  0    FOR THE DISEASE OF OBESITY:  BMI 37.0-37.9, adult - current BMI 37.81 Obesity, Beginning BMI 40.73 Assessment & Plan: Since last office visit on 09/29/2023 patient's  Muscle mass has increased by 2 lb. Fat mass has decreased by 0.6 lb. Total body water has increased by 3.2 lb.  Counseling done on how various foods will affect these numbers and how to maximize success  Total lbs lost to date: 21 lbs  Total weight loss percentage to date: 7.19%    Recommended Dietary Goals [name] is currently in the action stage of change. As such, his goal is to continue weight management plan.  He has agreed to: continue current plan   Behavioral Intervention We discussed the following today: continue to work on maintaining a reduced calorie state, getting the recommended amount of protein, incorporating whole foods, making healthy choices, staying well hydrated and practicing mindfulness when eating.  Additional resources provided today: None  Evidence-based interventions for health behavior change were  utilized today including the discussion of self monitoring techniques, problem-solving barriers and SMART goal setting techniques.   Regarding patient's less desirable eating habits and patterns, we employed the technique of small changes.   Pt will specifically work on: n/a   Recommended Physical Activity Goals [name] has been advised to work up to 300-450 minutes of moderate intensity aerobic activity a week and strengthening exercises 2-3 times per week for cardiovascular health, weight loss maintenance and preservation of muscle mass.   He has agreed to : continue to gradually increase the amount and intensity of exercise routine   Pharmacotherapy See Pre-DM note.    ASSOCIATED CONDITIONS ADDRESSED TODAY:  Prediabetes Assessment & Plan: Most recent A1c and fasting insulin :  Lab Results  Component Value Date   HGBA1C 6.1 (H) 05/03/2023   HGBA1C 6.0 07/03/2022   HGBA1C 5.8 (H) 12/22/2021   INSULIN  24.8 05/03/2023   INSULIN  22.7 12/22/2021   INSULIN  23.8 07/02/2021    Has been off his Metformin  for 2-3 weeks d/t lost to f/up.  Has been eating chips occasionally and "some snacks that are off-plan" which he attributes to not having his hunger and cravings under good control. Will refill his Metformin  today - pt agrees to restart at prescribed dose. Continue eating plan. Will recheck BMP with GFR and A1c today.    Essential hypertension-  with white coat syndrome Assessment & Plan: Last 3 blood pressure readings in our office are as  follows: BP Readings from Last 3 Encounters:  11/04/23 134/78  09/29/23 128/79  08/24/23 132/73   Lab Results  Component Value Date   CREATININE 1.32 (H) 05/03/2023   Blood pressure stable on Losartan , Lopressor , and Chlorthalidone . Continue adherence to all medications and low sodium diet. Losing 10% or more of body weight may improve blood pressure control    Vitamin D  deficiency Assessment & Plan: Most recent VD: Lab Results  Component  Value Date   VD25OH 80.9 05/03/2023   VD25OH 46.3 07/03/2022   VD25OH 48.1 12/22/2021   Currently on ergocalciferol  50,000 units every 10 days without any adverse effects such as nausea, vomiting or muscle weakness. Continue regimen. Recheck levels today.    Follow up:   Return 12/30/2023. He was informed of the importance of frequent follow up visits to maximize his success with intensive lifestyle modifications for his multiple health conditions.  Gayle Kava. is aware that we will review all of his lab results at our next visit.  He is aware that if anything is critical/ life threatening with the results, we will be contacting him via MyChart prior to the office visit to discuss management.    Subjective:   Chief complaint: Obesity Raymond Jordan is here to discuss his progress with his obesity treatment plan. He is on the Category 3 Plan and states he is following his eating plan approximately 75% of the time. He states he is walking 30 minutes 4 days per week.  Interval History:  Antino Prude. is here for a follow up office visit. Since last OV on 09/29/2023, Mr.Chrystal is up 1 lb. Denies skipping meals. Has been off his Metformin  for 2-3 weeks. Has been eating chips occasionally and "some snacks that are off-plan" which he attributes to not having his hunger and cravings under good control.   Pharmacotherapy that aid with weight loss: Has been off Metformin  for 2-3 weeks. Prescribed Tirzepatide  LOV but denied by insurance.   Review of Systems:  Pertinent positives were addressed with patient today. Reviewed by clinician on day of visit: allergies, medications, problem list, medical history, surgical history, family history, social history, and previous encounter notes.  Weight Summary and Biometrics   Weight Lost Since Last Visit: 0lb  Weight Gained Since Last Visit: 1lb   Vitals Temp: 98.2 F (36.8 C) BP: 134/78 (Manual) Pulse Rate: 69 SpO2: 100 %   Anthropometric  Measurements Height: 5\' 11"  (1.803 m) Weight: 271 lb (122.9 kg) BMI (Calculated): 37.81 Weight at Last Visit: 270lb Weight Lost Since Last Visit: 0lb Weight Gained Since Last Visit: 1lb Starting Weight: 292lb Total Weight Loss (lbs): 21 lb (9.526 kg)   Body Composition  Body Fat %: 36.2 % Fat Mass (lbs): 98.2 lbs Muscle Mass (lbs): 164.8 lbs Total Body Water (lbs): 119.2 lbs Visceral Fat Rating : 22   Other Clinical Data Fasting: No Labs: No Today's Visit #: 43 Starting Date: 01/18/20   Objective:   PHYSICAL EXAM: Blood pressure 134/78, pulse 69, temperature 98.2 F (36.8 C), height 5\' 11"  (1.803 m), weight 271 lb (122.9 kg), SpO2 100%. Body mass index is 37.8 kg/m.  General: he is overweight, cooperative and in no acute distress. PSYCH: Has normal mood, affect and thought process.   HEENT: EOMI, sclerae are anicteric. Lungs: Normal breathing effort, no conversational dyspnea. Extremities: Moves * 4 Neurologic: A and O * 3, good insight  DIAGNOSTIC DATA REVIEWED: BMET    Component Value Date/Time   NA 139 05/03/2023  0755   K 3.7 05/03/2023 0755   CL 104 05/03/2023 0755   CO2 25 05/03/2023 0755   GLUCOSE 96 05/03/2023 0755   GLUCOSE 96 04/04/2021 0107   BUN 18 05/03/2023 0755   CREATININE 1.32 (H) 05/03/2023 0755   CALCIUM 9.0 05/03/2023 0755   GFRNONAA 50 (L) 04/04/2021 0107   GFRAA 79 01/18/2020 1535   Lab Results  Component Value Date   HGBA1C 6.1 (H) 05/03/2023   HGBA1C 6.2 (H) 01/18/2020   Lab Results  Component Value Date   INSULIN  24.8 05/03/2023   INSULIN  27.9 (H) 01/18/2020   Lab Results  Component Value Date   TSH 1.460 05/03/2023   CBC    Component Value Date/Time   WBC 5.5 05/03/2023 0755   WBC 9.1 04/04/2021 0107   RBC 4.45 05/03/2023 0755   RBC 5.13 04/04/2021 0107   HGB 12.6 (L) 05/03/2023 0755   HCT 38.7 05/03/2023 0755   PLT 202 05/03/2023 0755   MCV 87 05/03/2023 0755   MCH 28.3 05/03/2023 0755   MCH 28.5 04/04/2021  0107   MCHC 32.6 05/03/2023 0755   MCHC 32.7 04/04/2021 0107   RDW 13.8 05/03/2023 0755   Iron Studies No results found for: "IRON", "TIBC", "FERRITIN", "IRONPCTSAT" Lipid Panel     Component Value Date/Time   CHOL 127 05/03/2023 0755   TRIG 90 05/03/2023 0755   HDL 46 05/03/2023 0755   CHOLHDL 2.6 06/30/2022 0710   CHOLHDL 2.7 05/04/2016 0736   VLDL 14 05/04/2016 0736   LDLCALC 64 05/03/2023 0755   Hepatic Function Panel     Component Value Date/Time   PROT 7.6 05/03/2023 0755   ALBUMIN 4.0 05/03/2023 0755   AST 23 05/03/2023 0755   ALT 15 05/03/2023 0755   ALKPHOS 46 05/03/2023 0755   BILITOT 0.2 05/03/2023 0755   BILIDIR 0.09 01/13/2019 0845   IBILI 0.2 05/04/2016 0736      Component Value Date/Time   TSH 1.460 05/03/2023 0755   Nutritional Lab Results  Component Value Date   VD25OH 80.9 05/03/2023   VD25OH 46.3 07/03/2022   VD25OH 48.1 12/22/2021    Attestations:   I, Special Puri, acting as a Stage manager for [employer], DO., have compiled all relevant documentation for today's office visit on behalf of Marceil Sensor, DO, while in the presence of [employer], DO.  I have reviewed the above documentation for accuracy and completeness, and I agree with the above. Raymond Jordan, D.O.  The 21st Century Cures Act was signed into law in 2016 which includes the topic of electronic health records.  This provides immediate access to information in MyChart.  This includes consultation notes, operative notes, office notes, lab results and pathology reports.  If you have any questions about what you read please let us  know at your next visit so we can discuss your concerns and take corrective action if need be.  We are right here with you.

## 2023-11-04 NOTE — Telephone Encounter (Signed)
 Called patient and informed him that he doesn't need to fasted for his visit labs today. Pt verbalized understanding. Pt had no questions at this time but was encouraged to call back if questions arise.

## 2023-11-05 LAB — HEMOGLOBIN A1C
Est. average glucose Bld gHb Est-mCnc: 128 mg/dL
Hgb A1c MFr Bld: 6.1 % — ABNORMAL HIGH (ref 4.8–5.6)

## 2023-11-05 LAB — VITAMIN D 25 HYDROXY (VIT D DEFICIENCY, FRACTURES): Vit D, 25-Hydroxy: 57.6 ng/mL (ref 30.0–100.0)

## 2023-11-05 LAB — BASIC METABOLIC PANEL WITH GFR
BUN/Creatinine Ratio: 19 (ref 10–24)
BUN: 25 mg/dL (ref 8–27)
CO2: 22 mmol/L (ref 20–29)
Calcium: 9.4 mg/dL (ref 8.6–10.2)
Chloride: 99 mmol/L (ref 96–106)
Creatinine, Ser: 1.35 mg/dL — ABNORMAL HIGH (ref 0.76–1.27)
Glucose: 77 mg/dL (ref 70–99)
Potassium: 3.4 mmol/L — ABNORMAL LOW (ref 3.5–5.2)
Sodium: 138 mmol/L (ref 134–144)
eGFR: 60 mL/min/{1.73_m2} (ref >59)

## 2023-11-10 ENCOUNTER — Encounter (INDEPENDENT_AMBULATORY_CARE_PROVIDER_SITE_OTHER): Payer: Self-pay | Admitting: Family Medicine

## 2023-11-10 NOTE — Telephone Encounter (Signed)
 Called ins and they said they got a decline when rebilling for dos of 10/25/23. I called the coupon and they said coupon was reactivated on 11/01/23 so 10/25/23 would be invalid. I called and notified patient

## 2023-11-15 ENCOUNTER — Encounter (INDEPENDENT_AMBULATORY_CARE_PROVIDER_SITE_OTHER): Payer: Self-pay | Admitting: Family Medicine

## 2023-11-20 ENCOUNTER — Other Ambulatory Visit (INDEPENDENT_AMBULATORY_CARE_PROVIDER_SITE_OTHER): Payer: Self-pay | Admitting: Internal Medicine

## 2023-11-20 DIAGNOSIS — R7303 Prediabetes: Secondary | ICD-10-CM

## 2023-11-22 ENCOUNTER — Encounter (INDEPENDENT_AMBULATORY_CARE_PROVIDER_SITE_OTHER): Payer: Self-pay | Admitting: Family Medicine

## 2023-11-22 ENCOUNTER — Telehealth (INDEPENDENT_AMBULATORY_CARE_PROVIDER_SITE_OTHER): Payer: Self-pay

## 2023-11-22 NOTE — Telephone Encounter (Signed)
 Patient asked for someone to call him, but he did not answer. I left a message for him to return my call.

## 2023-12-08 ENCOUNTER — Other Ambulatory Visit (HOSPITAL_COMMUNITY): Payer: Self-pay

## 2023-12-08 MED ORDER — TIMOLOL MALEATE 0.5 % OP SOLG
OPHTHALMIC | 0 refills | Status: AC
  Filled 2023-12-08: qty 5, 30d supply, fill #0

## 2023-12-08 MED ORDER — TIMOLOL MALEATE 0.5 % OP SOLG: OPHTHALMIC | 0 refills | Status: AC

## 2023-12-09 ENCOUNTER — Other Ambulatory Visit (HOSPITAL_COMMUNITY): Payer: Self-pay

## 2023-12-28 ENCOUNTER — Ambulatory Visit (INDEPENDENT_AMBULATORY_CARE_PROVIDER_SITE_OTHER): Admitting: Family Medicine

## 2023-12-28 ENCOUNTER — Encounter (INDEPENDENT_AMBULATORY_CARE_PROVIDER_SITE_OTHER): Payer: Self-pay | Admitting: Family Medicine

## 2023-12-28 VITALS — BP 138/82 | HR 62 | Temp 98.8°F | Ht 71.0 in | Wt 277.0 lb

## 2023-12-28 DIAGNOSIS — R748 Abnormal levels of other serum enzymes: Secondary | ICD-10-CM | POA: Diagnosis not present

## 2023-12-28 DIAGNOSIS — Z6838 Body mass index (BMI) 38.0-38.9, adult: Secondary | ICD-10-CM

## 2023-12-28 DIAGNOSIS — I10 Essential (primary) hypertension: Secondary | ICD-10-CM | POA: Diagnosis not present

## 2023-12-28 DIAGNOSIS — E559 Vitamin D deficiency, unspecified: Secondary | ICD-10-CM | POA: Diagnosis not present

## 2023-12-28 DIAGNOSIS — E669 Obesity, unspecified: Secondary | ICD-10-CM

## 2023-12-28 DIAGNOSIS — R7303 Prediabetes: Secondary | ICD-10-CM

## 2023-12-28 NOTE — Progress Notes (Signed)
 Raymond Jordan, D.O.  ABFM, ABOM Specializing in Clinical Bariatric Medicine  Office located at: [address] [place], [place]  [address]   Assessment and Plan:   FOR THE DISEASE OF OBESITY:  BMI 38.0-38.9,adult current 38.63 Obesity, Beginning BMI 40.73 Assessment & Plan: Since last office visit on 09/29/2023 patient's  Muscle mass has increased by 2 lb. Fat mass has increased by 3.2 lb. Total body water has increased by 1.8  lb.  Counseling done on how various foods will affect these numbers and how to maximize success  Total lbs lost to date: 15 lbs  Total weight loss percentage to date: 5.14%    Recommended Dietary Goals Raymond Jordan is currently in the action stage of change. As such, his goal is to continue weight management plan.  He has agreed to: continue current plan   Behavioral Intervention We discussed the following today: working on self-care, visceral obesity,  increasing lean protein intake to established goals, decreasing simple carbohydrates , and increasing water intake   Additional resources provided today: Handout on CAT 3 meal plan, Handout on CAT 3-4 breakfast options, Handout on CAT 3-4 lunch options, and Handout on recipe packet I,  Evidence-based interventions for health behavior change were utilized today including the discussion of self monitoring techniques, problem-solving barriers and SMART goal setting techniques.   Regarding patient's less desirable eating habits and patterns, we employed the technique of small changes.   Pt will specifically work on reading, knowing, and following his meal plan.    Recommended Physical Activity Goals Raymond Jordan has been advised to work up to 300-450 minutes of moderate intensity aerobic activity a week and strengthening exercises 2-3 times per week for cardiovascular health, weight loss maintenance and preservation of muscle mass.   He has agreed to : continue to gradually increase the amount and intensity of  exercise routine   Pharmacotherapy We both agreed to continue same regimen.    ASSOCIATED CONDITIONS ADDRESSED TODAY:   Prediabetes Assessment & Plan: Lab Results  Component Value Date   HGBA1C 6.1 (H) 11/04/2023   HGBA1C 6.1 (H) 05/03/2023   HGBA1C 6.0 07/03/2022   INSULIN  24.8 05/03/2023   INSULIN  22.7 12/22/2021   INSULIN  23.8 07/02/2021   On Metformin  500 mg twice daily with reported good compliance and tolerance. No improvement in Hemoglobin A1c since last check. In fact, since 06/2021 his A1c has either been stable or increasing. In general, his hunger is controlled, but sometimes he has emotional cravings secondary to stress.   Continue medication and working on nutrition plan to decrease simple carbohydrates, increase lean proteins and exercise to promote weight loss and improve glycemic control and prevent progression to T2DM.    Essential hypertension-  with white coat syndrome Assessment & Plan: Last 3 blood pressure readings in our office are as follows: BP Readings from Last 3 Encounters:  12/28/23 138/82  11/04/23 134/78  09/29/23 128/79   On Chlorthalidone  25 mg daily, Losartan  25 mg daily, and Metoprolol  50 mg twice daily. BP was initially 146/43; recheck was 138/82. He returned from a cruise on Saturday and endorses having high sodium meals there. Pt asx.    Goal BP: 130/80 or less on a regular basis; check BP at home every other day. If BP is not at goal, pt advised to f/up with cardiology. Continue medicines and low sodium meal plan    Elevated creatine kinase level Assessment & Plan: Lab Results  Component Value Date   CREATININE 1.35 (  H) 11/04/2023   BUN 25 11/04/2023   NA 138 11/04/2023   K 3.4 (L) 11/04/2023   CL 99 11/04/2023   CO2 22 11/04/2023   Potassium is essentially within normal limits. Serum creatinine increased from 1.32 to 1.35. Discussed that exercise, avoiding nephrotoxic substances like NSAIDs, adequate hydration (Drink at least  half of your weight in ounces of water per day unless otherwise noted by one of your doctors that you must restrict water intake), and controlling blood sugars/blood pressure can improve kidney function.     Vitamin D  deficiency Assessment & Plan: Lab Results  Component Value Date   VD25OH 57.6 11/04/2023   VD25OH 80.9 05/03/2023   VD25OH 46.3 07/03/2022   On ergocalciferol  50,000 units every 10 days. His VD levels are at goal of 50 to 70.  Continue regimen. Recheck 2-3 months.     Follow up:   Return 02/02/2024 at 4:00 PM. He was informed of the importance of frequent follow up visits to maximize his success with intensive lifestyle modifications for his multiple health conditions.   Subjective:   Chief complaint: Obesity Raymond Jordan is here to discuss his progress with his obesity treatment plan. He is on the Category 3 Plan with Breakfast and Lunch option and states he is following his eating plan approximately 65% of the time. He states he is walking 30 minutes 2 days per week.  Interval History:  Raymond Aden. is here for a follow up office visit. Since last OV on 11/04/2023, Raymond Jordan is up 6 lbs . Recently returned from a cruise trip on Saturday. Endorses drinking some alcoholic beverages and a few off plan foods during the trip. Overall, he has been focusing on increasing his protein intake.    Pharmacotherapy that aid with weight loss: He is currently taking Metformin  500 mg two times daily. Not taking Zepbound ; can't afford and not covered by insurance. Was on Mounjaro  in the past, but insurance no longer covers it.    Weight Summary and Biometrics   Weight Lost Since Last Visit: 0lb  Weight Gained Since Last Visit: 6lb    Vitals Temp: 98.8 F (37.1 C) BP: (!) 146/83 Pulse Rate: 62 SpO2: 100 %   Anthropometric Measurements Height: 5' 11 (1.803 m) Weight: 277 lb (125.6 kg) BMI (Calculated): 38.65 Weight at Last Visit: 271lb Weight Lost Since Last Visit:  0lb Weight Gained Since Last Visit: 6lb Starting Weight: 292lb Total Weight Loss (lbs): 15 lb (6.804 kg)   Body Composition  Body Fat %: 36.6 % Fat Mass (lbs): 101.4 lbs Muscle Mass (lbs): 167.2 lbs Total Body Water (lbs): 121 lbs Visceral Fat Rating : 23   Other Clinical Data Fasting: No Labs: no Today's Visit #: 44 Starting Date: 01/17/21    Objective:   PHYSICAL EXAM: Blood pressure 138/82, pulse 62, temperature 98.8 F (37.1 C), height 5' 11 (1.803 m), weight 277 lb (125.6 kg), SpO2 100%. Body mass index is 38.63 kg/m.  General: he is overweight, cooperative and in no acute distress. PSYCH: Has normal mood, affect and thought process.   HEENT: EOMI, sclerae are anicteric. Lungs: Normal breathing effort, no conversational dyspnea. Extremities: Moves * 4 Neurologic: A and O * 3, good insight NECK: Has a 2.5 cm or so fullness lateral to his right thyroid gland.   DIAGNOSTIC DATA REVIEWED: BMET    Component Value Date/Time   NA 138 11/04/2023 1633   K 3.4 (L) 11/04/2023 1633   CL 99 11/04/2023 1633  CO2 22 11/04/2023 1633   GLUCOSE 77 11/04/2023 1633   GLUCOSE 96 04/04/2021 0107   BUN 25 11/04/2023 1633   CREATININE 1.35 (H) 11/04/2023 1633   CALCIUM 9.4 11/04/2023 1633   GFRNONAA 50 (L) 04/04/2021 0107   GFRAA 79 01/18/2020 1535   Lab Results  Component Value Date   HGBA1C 6.1 (H) 11/04/2023   HGBA1C 6.2 (H) 01/18/2020   Lab Results  Component Value Date   INSULIN  24.8 05/03/2023   INSULIN  27.9 (H) 01/18/2020   Lab Results  Component Value Date   TSH 1.460 05/03/2023   CBC    Component Value Date/Time   WBC 5.5 05/03/2023 0755   WBC 9.1 04/04/2021 0107   RBC 4.45 05/03/2023 0755   RBC 5.13 04/04/2021 0107   HGB 12.6 (L) 05/03/2023 0755   HCT 38.7 05/03/2023 0755   PLT 202 05/03/2023 0755   MCV 87 05/03/2023 0755   MCH 28.3 05/03/2023 0755   MCH 28.5 04/04/2021 0107   MCHC 32.6 05/03/2023 0755   MCHC 32.7 04/04/2021 0107   RDW 13.8  05/03/2023 0755   Iron Studies No results found for: IRON, TIBC, FERRITIN, IRONPCTSAT Lipid Panel     Component Value Date/Time   CHOL 127 05/03/2023 0755   TRIG 90 05/03/2023 0755   HDL 46 05/03/2023 0755   CHOLHDL 2.6 06/30/2022 0710   CHOLHDL 2.7 05/04/2016 0736   VLDL 14 05/04/2016 0736   LDLCALC 64 05/03/2023 0755   Hepatic Function Panel     Component Value Date/Time   PROT 7.6 05/03/2023 0755   ALBUMIN 4.0 05/03/2023 0755   AST 23 05/03/2023 0755   ALT 15 05/03/2023 0755   ALKPHOS 46 05/03/2023 0755   BILITOT 0.2 05/03/2023 0755   BILIDIR 0.09 01/13/2019 0845   IBILI 0.2 05/04/2016 0736      Component Value Date/Time   TSH 1.460 05/03/2023 0755   Nutritional Lab Results  Component Value Date   VD25OH 57.6 11/04/2023   VD25OH 80.9 05/03/2023   VD25OH 46.3 07/03/2022    Attestations:   I, Raymond Jordan, acting as a Stage manager for [employer], DO., have compiled all relevant documentation for today's office visit on behalf of Raymond Jenkins, DO, while in the presence of [employer], DO.  Reviewed by clinician on day of visit: allergies, medications, problem list, medical history, surgical history, family history, social history, and previous encounter notes pertinent to patient's obesity diagnosis.  I have spent 42 minutes in the care of the patient today including 37 minutes face-to-face assessing and reviewing listed medical problems above as outlined in office visit note and providing nutritional and behavioral counseling as outlined in obesity care plan.  I have reviewed the above documentation for accuracy and completeness, and I agree with the above. Raymond JINNY Jordan, D.O.  The 21st Century Cures Act was signed into law in 2016 which includes the topic of electronic health records.  This provides immediate access to information in MyChart. This includes consultation notes, operative notes, office notes, lab results and pathology reports.   If you have any questions about what you read please let us  know at your next visit so we can discuss your concerns and take corrective action if need be.  We are right here with you.

## 2023-12-30 ENCOUNTER — Ambulatory Visit (INDEPENDENT_AMBULATORY_CARE_PROVIDER_SITE_OTHER): Admitting: Family Medicine

## 2024-01-05 ENCOUNTER — Other Ambulatory Visit (INDEPENDENT_AMBULATORY_CARE_PROVIDER_SITE_OTHER): Payer: Self-pay | Admitting: Family Medicine

## 2024-01-05 DIAGNOSIS — R7303 Prediabetes: Secondary | ICD-10-CM

## 2024-01-24 ENCOUNTER — Other Ambulatory Visit (INDEPENDENT_AMBULATORY_CARE_PROVIDER_SITE_OTHER): Payer: Self-pay | Admitting: Family Medicine

## 2024-01-24 DIAGNOSIS — E559 Vitamin D deficiency, unspecified: Secondary | ICD-10-CM

## 2024-02-02 ENCOUNTER — Encounter (INDEPENDENT_AMBULATORY_CARE_PROVIDER_SITE_OTHER): Payer: Self-pay | Admitting: Family Medicine

## 2024-02-02 ENCOUNTER — Ambulatory Visit (INDEPENDENT_AMBULATORY_CARE_PROVIDER_SITE_OTHER): Admitting: Family Medicine

## 2024-02-02 VITALS — BP 131/74 | HR 72 | Temp 99.1°F | Ht 71.0 in | Wt 272.0 lb

## 2024-02-02 DIAGNOSIS — R7303 Prediabetes: Secondary | ICD-10-CM

## 2024-02-02 DIAGNOSIS — I10 Essential (primary) hypertension: Secondary | ICD-10-CM

## 2024-02-02 DIAGNOSIS — E559 Vitamin D deficiency, unspecified: Secondary | ICD-10-CM | POA: Diagnosis not present

## 2024-02-02 DIAGNOSIS — Z6837 Body mass index (BMI) 37.0-37.9, adult: Secondary | ICD-10-CM

## 2024-02-02 DIAGNOSIS — E669 Obesity, unspecified: Secondary | ICD-10-CM

## 2024-02-02 MED ORDER — METFORMIN HCL 500 MG PO TABS: 500.0000 mg | ORAL_TABLET | Freq: Two times a day (BID) | ORAL | 1 refills | Status: DC

## 2024-02-02 MED ORDER — VITAMIN D (ERGOCALCIFEROL) 1.25 MG (50000 UNIT) PO CAPS: ORAL_CAPSULE | ORAL | 0 refills | Status: DC

## 2024-02-02 NOTE — Progress Notes (Signed)
 Raymond Jordan, D.O.  ABFM, ABOM Specializing in Clinical Bariatric Medicine  Office located at: [address] [place], [place]  [address]     Assessment and Plan:   Medications Discontinued During This Encounter  Medication Reason   tirzepatide  (ZEPBOUND ) 2.5 MG/0.5ML Pen Cost of medication   metFORMIN  (GLUCOPHAGE ) 500 MG tablet Reorder   Vitamin D , Ergocalciferol , (DRISDOL ) 1.25 MG (50000 UNIT) CAPS capsule Reorder    Meds ordered this encounter  Medications   Vitamin D , Ergocalciferol , (DRISDOL ) 1.25 MG (50000 UNIT) CAPS capsule    Sig: Take 1 capsule by mouth every 10 days as directed    Dispense:  9 capsule    Refill:  0   metFORMIN  (GLUCOPHAGE ) 500 MG tablet    Sig: Take 1 tablet (500 mg total) by mouth 2 (two) times daily with a meal.    Dispense:  60 tablet    Refill:  1     FOR THE DISEASE OF OBESITY: Obesity, Beginning BMI 40.73 BMI 37.0-37.9, adult - current BMI 37.94 Assessment & Plan: Since last office visit on 12/28/23 patient's muscle mass has decreased by 2.4 lbs. Fat mass has decreased by 2 lbs. Total body water has increased by 2 lbs.  Counseling done on how various foods will affect these numbers and how to maximize success  Total lbs lost to date: 20 lbs Total weight loss percentage to date: -6.85 %   Recommended Dietary Goals Raymond Jordan is currently in the action stage of change. As such, his goal is to continue weight management plan.  He has agreed to: continue current plan   Behavioral Intervention We discussed the following today: increasing lean protein intake to established goals, decreasing simple carbohydrates , increasing water intake , and continue to work on maintaining a reduced calorie state, getting the recommended amount of protein, incorporating whole foods, making healthy choices, staying well hydrated and practicing mindfulness when eating.  Additional resources provided today: None  Evidence-based interventions for health  behavior change were utilized today including the discussion of self monitoring techniques, problem-solving barriers and SMART goal setting techniques.   Regarding patient's less desirable eating habits and patterns, we employed the technique of small changes.   Pt will specifically work on: Measure/weigh food every other night   Recommended Physical Activity Goals Raymond Jordan has been advised to work up to 300-450 minutes of moderate intensity aerobic activity a week and strengthening exercises 2-3 times per week for cardiovascular health, weight loss maintenance and preservation of muscle mass.   He has agreed to: Continue current level of physical activity  and consider increasing frequency/intensity of aerobic exercising and consider adding strength training in the future.    Pharmacotherapy We both agreed to: Continue with current nutritional and behavioral strategies and continue meds that aid in wt loss (Metformin ).    ASSOCIATED CONDITIONS ADDRESSED TODAY:  Prediabetes Assessment & Plan: Lab Results  Component Value Date   HGBA1C 6.1 (H) 11/04/2023   HGBA1C 6.1 (H) 05/03/2023   HGBA1C 6.0 07/03/2022   INSULIN  24.8 05/03/2023   INSULIN  22.7 12/22/2021   INSULIN  23.8 07/02/2021    Currently on Metformin  500 mg BID. Good compliance and tolerance with no adverse SE. Increased his water intake to ~100 ounces/day.    Continue focusing on following his prudent nutritional meal plan by prioritizing his lean protein and fiber intake. Reminded pt of the role of protein as it relates to stabilizing sugars and promoting wt loss. Encouraged pt to gradually increase  his exercise intensity and frequency. Stay properly hydrated; aim for 1/2 his body weight in ounces of water per day or at least 1 gallon per day. Continue with Metformin  at current dose. Will refill Metformin  today, no dose changes.     Essential hypertension-  with white coat syndrome Assessment & Plan: BP Readings from Last 3  Encounters:  02/02/24 131/74  12/28/23 138/82  11/04/23 134/78   Pt on Chlorthalidone  25 mg daily, Losartan  25 mg daily, and Metoprolol  50 mg BID. BP was stable today.   Reviewed BP goal of 130/80 or less. Continue heart-healthy diet per his nutritional meal plan. Continue focusing on prioritizing protein/fiber and decreasing high sodium processed foods. Increase exercise frequency and intensity to promote wt loss and improve overall condition.    Vitamin D  deficiency Assessment & Plan: Lab Results  Component Value Date   VD25OH 57.6 11/04/2023   VD25OH 80.9 05/03/2023   VD25OH 46.3 07/03/2022   Pt on ERGO 50K units once every 10 days with good compliance and tolerance. No acute concerns.   Continue current vitamin D  supplementation. Will refill ERGO today, no dose changes. Recheck VD levels periodically, in the next 1-2 months.     Follow up:   Return in about 4 weeks (around 03/01/2024). He was informed of the importance of frequent follow up visits to maximize his success with intensive lifestyle modifications for his multiple health conditions.  Subjective:   Chief complaint: Obesity Raymond Jordan is here to discuss his progress with his obesity treatment plan. He is on the Category 3 Plan w B/L options and states he is following his eating plan approximately 75-80% of the time. He states he is walking 20 minutes 2 days per week.   Interval History:  Raymond Recendez. is here for a follow up office visit. Since last OV on 12/28/23 , he is down 5 lbs. He has been prioritizing his protein intake. Not eating off-plan snacks like chips. Only had fries and pizza one time since his last visit. Has been eating center cut pork chops. He has been drinking a chocolate protein shake as a snack. Eating protein cereal with [employer] milk for breakfast.    Pharmacotherapy that aid with weight loss: He is currently taking Metformin  500 mg BID. Not taking Zepbound ; cost prohibitive due to  insurance not covering medication. Was on Mounjaro  in the past, but insurance no longer covers it.    Review of Systems:  Pertinent positives were addressed with patient today.  Reviewed by clinician on day of visit: allergies, medications, problem list, medical history, surgical history, family history, social history, and previous encounter notes.  Weight Summary and Biometrics   Weight Lost Since Last Visit: 5lb  Weight Gained Since Last Visit: 0lb   Vitals Temp: 99.1 F (37.3 C) BP: 131/74 Pulse Rate: 72 SpO2: 99 %   Anthropometric Measurements Height: 5' 11 (1.803 m) Weight: 272 lb (123.4 kg) BMI (Calculated): 37.95 Weight at Last Visit: 277lb Weight Lost Since Last Visit: 5lb Weight Gained Since Last Visit: 0lb Starting Weight: 292lb Total Weight Loss (lbs): 20 lb (9.072 kg)   Body Composition  Body Fat %: 36.5 % Fat Mass (lbs): 99.4 lbs Muscle Mass (lbs): 164.8 lbs Total Body Water (lbs): 123 lbs Visceral Fat Rating : 22   Other Clinical Data Fasting: No Labs: no Today's Visit #: 45 Starting Date: 01/17/21 Comments: Cat 3 B/L    Objective:   PHYSICAL EXAM: Blood pressure 131/74, pulse 72, temperature 99.1  F (37.3 C), height 5' 11 (1.803 m), weight 272 lb (123.4 kg), SpO2 99%. Body mass index is 37.94 kg/m.  General: he is overweight, cooperative and in no acute distress. PSYCH: Has normal mood, affect and thought process.   HEENT: EOMI, sclerae are anicteric. Lungs: Normal breathing effort, no conversational dyspnea. Extremities: Moves * 4 Neurologic: A and O * 3, good insight  DIAGNOSTIC DATA REVIEWED: BMET    Component Value Date/Time   NA 138 11/04/2023 1633   K 3.4 (L) 11/04/2023 1633   CL 99 11/04/2023 1633   CO2 22 11/04/2023 1633   GLUCOSE 77 11/04/2023 1633   GLUCOSE 96 04/04/2021 0107   BUN 25 11/04/2023 1633   CREATININE 1.35 (H) 11/04/2023 1633   CALCIUM 9.4 11/04/2023 1633   GFRNONAA 50 (L) 04/04/2021 0107   GFRAA  79 01/18/2020 1535   Lab Results  Component Value Date   HGBA1C 6.1 (H) 11/04/2023   HGBA1C 6.2 (H) 01/18/2020   Lab Results  Component Value Date   INSULIN  24.8 05/03/2023   INSULIN  27.9 (H) 01/18/2020   Lab Results  Component Value Date   TSH 1.460 05/03/2023   CBC    Component Value Date/Time   WBC 5.5 05/03/2023 0755   WBC 9.1 04/04/2021 0107   RBC 4.45 05/03/2023 0755   RBC 5.13 04/04/2021 0107   HGB 12.6 (L) 05/03/2023 0755   HCT 38.7 05/03/2023 0755   PLT 202 05/03/2023 0755   MCV 87 05/03/2023 0755   MCH 28.3 05/03/2023 0755   MCH 28.5 04/04/2021 0107   MCHC 32.6 05/03/2023 0755   MCHC 32.7 04/04/2021 0107   RDW 13.8 05/03/2023 0755   Iron Studies No results found for: IRON, TIBC, FERRITIN, IRONPCTSAT Lipid Panel     Component Value Date/Time   CHOL 127 05/03/2023 0755   TRIG 90 05/03/2023 0755   HDL 46 05/03/2023 0755   CHOLHDL 2.6 06/30/2022 0710   CHOLHDL 2.7 05/04/2016 0736   VLDL 14 05/04/2016 0736   LDLCALC 64 05/03/2023 0755   Hepatic Function Panel     Component Value Date/Time   PROT 7.6 05/03/2023 0755   ALBUMIN 4.0 05/03/2023 0755   AST 23 05/03/2023 0755   ALT 15 05/03/2023 0755   ALKPHOS 46 05/03/2023 0755   BILITOT 0.2 05/03/2023 0755   BILIDIR 0.09 01/13/2019 0845   IBILI 0.2 05/04/2016 0736      Component Value Date/Time   TSH 1.460 05/03/2023 0755   Nutritional Lab Results  Component Value Date   VD25OH 57.6 11/04/2023   VD25OH 80.9 05/03/2023   VD25OH 46.3 07/03/2022    Attestations:   I, Vernell Forest, acting as a Stage manager for Raymond Jenkins, DO., have compiled all relevant documentation for today's office visit on behalf of Raymond Jenkins, DO, while in the presence of [employer], DO.  I have reviewed the above documentation for accuracy and completeness, and I agree with the above. Raymond JINNY Jordan, D.O.  The 21st Century Cures Act was signed into law in 2016 which includes the topic of  electronic health records.  This provides immediate access to information in MyChart.  This includes consultation notes, operative notes, office notes, lab results and pathology reports.  If you have any questions about what you read please let us  know at your next visit so we can discuss your concerns and take corrective action if need be.  We are right here with you.

## 2024-03-02 ENCOUNTER — Ambulatory Visit (INDEPENDENT_AMBULATORY_CARE_PROVIDER_SITE_OTHER): Admitting: Family Medicine

## 2024-03-14 ENCOUNTER — Encounter (INDEPENDENT_AMBULATORY_CARE_PROVIDER_SITE_OTHER): Payer: Self-pay | Admitting: Family Medicine

## 2024-03-14 ENCOUNTER — Ambulatory Visit (INDEPENDENT_AMBULATORY_CARE_PROVIDER_SITE_OTHER): Admitting: Family Medicine

## 2024-03-14 VITALS — BP 169/70 | HR 62 | Temp 98.4°F | Ht 71.0 in | Wt 275.0 lb

## 2024-03-14 DIAGNOSIS — R7303 Prediabetes: Secondary | ICD-10-CM | POA: Diagnosis not present

## 2024-03-14 DIAGNOSIS — E669 Obesity, unspecified: Secondary | ICD-10-CM

## 2024-03-14 DIAGNOSIS — I10 Essential (primary) hypertension: Secondary | ICD-10-CM | POA: Diagnosis not present

## 2024-03-14 DIAGNOSIS — E559 Vitamin D deficiency, unspecified: Secondary | ICD-10-CM

## 2024-03-14 DIAGNOSIS — Z6838 Body mass index (BMI) 38.0-38.9, adult: Secondary | ICD-10-CM

## 2024-03-14 DIAGNOSIS — R7989 Other specified abnormal findings of blood chemistry: Secondary | ICD-10-CM

## 2024-03-14 DIAGNOSIS — E66813 Obesity, class 3: Secondary | ICD-10-CM

## 2024-03-14 MED ORDER — METFORMIN HCL 500 MG PO TABS: 500.0000 mg | ORAL_TABLET | Freq: Two times a day (BID) | ORAL | 1 refills | Status: DC

## 2024-03-14 MED ORDER — VITAMIN D (ERGOCALCIFEROL) 1.25 MG (50000 UNIT) PO CAPS: ORAL_CAPSULE | ORAL | 0 refills | Status: DC

## 2024-03-14 NOTE — Progress Notes (Signed)
 Raymond Jordan, D.O.  ABFM, ABOM Specializing in Clinical Bariatric Medicine  Office located at: [address] [place], [place]  [address]    Assessment and Plan:   Medications Discontinued During This Encounter  Medication Reason   Vitamin D , Ergocalciferol , (DRISDOL ) 1.25 MG (50000 UNIT) CAPS capsule Reorder   metFORMIN  (GLUCOPHAGE ) 500 MG tablet Reorder     Meds ordered this encounter  Medications   Vitamin D , Ergocalciferol , (DRISDOL ) 1.25 MG (50000 UNIT) CAPS capsule    Sig: Take 1 capsule by mouth every 10 days as directed    Dispense:  9 capsule    Refill:  0   metFORMIN  (GLUCOPHAGE ) 500 MG tablet    Sig: Take 1 tablet (500 mg total) by mouth 2 (two) times daily with a meal.    Dispense:  60 tablet    Refill:  1     Next OV recheck A1c, Vit D, BMP, etc.   FOR THE DISEASE OF OBESITY:  Obesity, Current BMI 38.37 Obesity, Beginning BMI 40.73 Assessment & Plan: Since last office visit on 02/02/24 patient's muscle mass has decreased by 0.4 lbs. Fat mass has increased by 3 lbs. Total body water has stayed the same.  Body fat % has increased by 0.7 %. Counseling done on how various foods will affect these numbers and how to maximize success  Total lbs lost to date: - 17 lbs Total weight loss percentage to date: -5.82 %   Recommended Dietary Goals Kiernan is currently in the action stage of change. As such, his goal is to continue weight management plan.  He has agreed to: [employer] 1400-1500 calories with 110+ g protein per day with Cat 3 MP as guide   Behavioral Intervention We discussed the following today: increasing water intake , work on tracking and journaling calories using tracking application, and continue to work on implementation of reduced calorie nutritional plan, making healthier choices when eating out, being consistent.  Additional resources provided today: Eating out guide given and provided patient with personalized instruction on  finding healthier options when eating out.   and Handout on Daily Food Journaling Log  Evidence-based interventions for health behavior change were utilized today including the discussion of self monitoring techniques, problem-solving barriers and SMART goal setting techniques.   Regarding patient's less desirable eating habits and patterns, we employed the technique of small changes.   Goal(s) for next OV: Journal intake and total at the end of the day.    Recommended Physical Activity Goals Keo has been advised to work up to 300-450 minutes of moderate intensity aerobic activity a week and strengthening exercises 2-3 times per week for cardiovascular health, weight loss maintenance and preservation of muscle mass.   He has agreed to: Think about enjoyable ways to increase daily physical activity and overcoming barriers to exercise and Increase physical activity in their day and reduce sedentary time (increase NEAT).   Pharmacotherapy We both agreed to: Continue with current nutritional/behavioral strategies and medication (Metformin .)    ASSOCIATED CONDITIONS ADDRESSED TODAY:  Prediabetes Assessment & Plan Lab Results  Component Value Date   HGBA1C 6.1 (H) 11/04/2023   HGBA1C 6.1 (H) 05/03/2023   HGBA1C 6.0 07/03/2022   INSULIN  24.8 05/03/2023   INSULIN  22.7 12/22/2021   INSULIN  23.8 07/02/2021   On Metformin  500 mg BID. With good tolerance and compliance. Hunger an cravings are mostly controlled but does have some sweet cravings at time. Will continue taking and focus on prioritizing his  lean protein intake. Refilled today. Will recheck labs at next OV    Vitamin D  deficiency Assessment & Plan Lab Results  Component Value Date   VD25OH 57.6 11/04/2023   VD25OH 80.9 05/03/2023   VD25OH 46.3 07/03/2022  Ergocalciferol  50000 units every 10 days. Stated good compliance and no side effects. Will continue taking as recommended. Refill today. Recheck labs at next OV  visit.    Essential hypertension-  poorly controlled Assessment & Plan BP Readings from Last 3 Encounters:  03/14/24 (!) 169/70  02/02/24 131/74  12/28/23 138/82   Pt on Chlorthalidone  25 mg daily, Losartan  25 mg daily, and Metoprolol  50 mg BID. Continue eating healthy diet and decreasing high sodium food. BP was high today and not at goal.Pt is asymptomatic for high BP. Recommended to check BP at home every 2-3 days if not less than 130/80 than call cardiologist.    Elevated serum creatinine Lab Results  Component Value Date   CREATININE 1.35 (H) 11/04/2023   BUN 25 11/04/2023   NA 138 11/04/2023   K 3.4 (L) 11/04/2023   CL 99 11/04/2023   CO2 22 11/04/2023   Reviewed labs with pt. Serum creatine was above goal at 1.35. Exercise, avoiding nephrotoxic substances like NSAIDs, adequate hydration (Drink at least half of your weight in ounces of water per day unless otherwise noted by one of your doctors that you must restrict water intake), and controlling blood sugars/blood pressure can improve kidney function. Obtain BMP next OV    Follow up:   Return 04/17/24 at 2:40 PM  He was informed of the importance of frequent follow up visits to maximize his success with intensive lifestyle modifications for his multiple health conditions.   Subjective:    Chief complaint: Obesity [name] is here to discuss his progress with his obesity treatment plan. He is on the Category 3 Plan w B/L options and states he is following his eating plan approximately 75% of the time. Pt is not exercising.    Interval History:  Dakari Cregger. is here for a follow up office visit. Pt has experienced a weight gain of 3 lbs since last OV on 02/02/24. He states that he is not getting the recommended protein and water intake. He reports not skipping meals and not sleeping the 7-9 hours every night. His water intake was doing well but just seemed to not be consistent but is getting more than previously.  He reports eating off plan about 2-3 days usually during breakfast or dinner.    Pharmacotherapy that aid with weight loss: He is currently taking Metformin  500 mg BID.   Review of Systems:  Pertinent positives were addressed with patient today.  Reviewed by clinician on day of visit: allergies, medications, problem list, medical history, surgical history, family history, social history, and previous encounter notes.  Weight Summary and Biometrics   Weight Lost Since Last Visit: 0  Weight Gained Since Last Visit: 3lb   Vitals Temp: 98.4 F (36.9 C) BP: (!) 169/70 Pulse Rate: 62 SpO2: 99 %   Anthropometric Measurements Height: 5' 11 (1.803 m) Weight at Last Visit: 272lb Starting Weight: 292lb   No data recorded Other Clinical Data Fasting: no Labs: no Today's Visit #: 46 Starting Date: 01/17/21    Objective:   PHYSICAL EXAM: Blood pressure (!) 149/76, pulse 62, temperature 98.4 F (36.9 C), height 5' 11 (1.803 m), SpO2 99%. Body mass index is 37.94 kg/m.  General: he is overweight, cooperative and in  no acute distress. PSYCH: Has normal mood, affect and thought process.   HEENT: EOMI, sclerae are anicteric. Lungs: Normal breathing effort, no conversational dyspnea. Extremities: Moves * 4 Neurologic: A and O * 3, good insight  DIAGNOSTIC DATA REVIEWED: BMET    Component Value Date/Time   NA 138 11/04/2023 1633   K 3.4 (L) 11/04/2023 1633   CL 99 11/04/2023 1633   CO2 22 11/04/2023 1633   GLUCOSE 77 11/04/2023 1633   GLUCOSE 96 04/04/2021 0107   BUN 25 11/04/2023 1633   CREATININE 1.35 (H) 11/04/2023 1633   CALCIUM 9.4 11/04/2023 1633   GFRNONAA 50 (L) 04/04/2021 0107   GFRAA 79 01/18/2020 1535   Lab Results  Component Value Date   HGBA1C 6.1 (H) 11/04/2023   HGBA1C 6.2 (H) 01/18/2020   Lab Results  Component Value Date   INSULIN  24.8 05/03/2023   INSULIN  27.9 (H) 01/18/2020   Lab Results  Component Value Date   TSH 1.460 05/03/2023    CBC    Component Value Date/Time   WBC 5.5 05/03/2023 0755   WBC 9.1 04/04/2021 0107   RBC 4.45 05/03/2023 0755   RBC 5.13 04/04/2021 0107   HGB 12.6 (L) 05/03/2023 0755   HCT 38.7 05/03/2023 0755   PLT 202 05/03/2023 0755   MCV 87 05/03/2023 0755   MCH 28.3 05/03/2023 0755   MCH 28.5 04/04/2021 0107   MCHC 32.6 05/03/2023 0755   MCHC 32.7 04/04/2021 0107   RDW 13.8 05/03/2023 0755   Iron Studies No results found for: IRON, TIBC, FERRITIN, IRONPCTSAT Lipid Panel     Component Value Date/Time   CHOL 127 05/03/2023 0755   TRIG 90 05/03/2023 0755   HDL 46 05/03/2023 0755   CHOLHDL 2.6 06/30/2022 0710   CHOLHDL 2.7 05/04/2016 0736   VLDL 14 05/04/2016 0736   LDLCALC 64 05/03/2023 0755   Hepatic Function Panel     Component Value Date/Time   PROT 7.6 05/03/2023 0755   ALBUMIN 4.0 05/03/2023 0755   AST 23 05/03/2023 0755   ALT 15 05/03/2023 0755   ALKPHOS 46 05/03/2023 0755   BILITOT 0.2 05/03/2023 0755   BILIDIR 0.09 01/13/2019 0845   IBILI 0.2 05/04/2016 0736      Component Value Date/Time   TSH 1.460 05/03/2023 0755   Nutritional Lab Results  Component Value Date   VD25OH 57.6 11/04/2023   VD25OH 80.9 05/03/2023   VD25OH 46.3 07/03/2022    Attestations:   I, Sonny Laroche, acting as a Stage manager for Raymond Jenkins, DO., have compiled all relevant documentation for today's office visit on behalf of Raymond Jenkins, DO, while in the presence of [employer], DO.  I have spent 48 minutes in the care of the patient today including 38 minutes face-to-face assessing and reviewing listed medical problems above as outlined in office visit note and providing nutritional and behavioral counseling as outlined in obesity care plan.   I have reviewed the above documentation for accuracy and completeness, and I agree with the above. Raymond JINNY Jordan, D.O.  The 21st Century Cures Act was signed into law in 2016 which includes the topic of electronic  health records.  This provides immediate access to information in MyChart.  This includes consultation notes, operative notes, office notes, lab results and pathology reports.  If you have any questions about what you read please let us  know at your next visit so we can discuss your concerns and take corrective action if need be.  We are right here with you.

## 2024-03-27 ENCOUNTER — Other Ambulatory Visit: Payer: Self-pay | Admitting: Physician Assistant

## 2024-03-27 DIAGNOSIS — E7800 Pure hypercholesterolemia, unspecified: Secondary | ICD-10-CM

## 2024-04-17 ENCOUNTER — Encounter (INDEPENDENT_AMBULATORY_CARE_PROVIDER_SITE_OTHER): Payer: Self-pay | Admitting: Family Medicine

## 2024-04-17 ENCOUNTER — Ambulatory Visit (INDEPENDENT_AMBULATORY_CARE_PROVIDER_SITE_OTHER): Admitting: Family Medicine

## 2024-04-17 VITALS — BP 129/81 | HR 66 | Temp 98.1°F | Ht 71.0 in | Wt 264.0 lb

## 2024-04-17 DIAGNOSIS — I10 Essential (primary) hypertension: Secondary | ICD-10-CM | POA: Diagnosis not present

## 2024-04-17 DIAGNOSIS — R7303 Prediabetes: Secondary | ICD-10-CM

## 2024-04-17 DIAGNOSIS — Z6836 Body mass index (BMI) 36.0-36.9, adult: Secondary | ICD-10-CM

## 2024-04-17 DIAGNOSIS — I214 Non-ST elevation (NSTEMI) myocardial infarction: Secondary | ICD-10-CM | POA: Diagnosis not present

## 2024-04-17 DIAGNOSIS — E559 Vitamin D deficiency, unspecified: Secondary | ICD-10-CM | POA: Diagnosis not present

## 2024-04-17 DIAGNOSIS — E669 Obesity, unspecified: Secondary | ICD-10-CM

## 2024-04-17 MED ORDER — VITAMIN D (ERGOCALCIFEROL) 1.25 MG (50000 UNIT) PO CAPS: ORAL_CAPSULE | ORAL | 0 refills | Status: AC

## 2024-04-17 MED ORDER — METFORMIN HCL 500 MG PO TABS
500.0000 mg | ORAL_TABLET | Freq: Two times a day (BID) | ORAL | 1 refills | Status: DC
Start: 2024-04-17 — End: 2024-05-24

## 2024-04-17 NOTE — Progress Notes (Signed)
 Raymond Jordan, D.O.  ABFM, ABOM Specializing in Clinical Bariatric Medicine  Office located at: [address] [place], [place]  [address]      A) FOR THE CHRONIC DISEASE OF OBESITY:  Chief complaint: Obesity Raymond Jordan is here to discuss his progress with his obesity treatment plan.   History of present illness / Interval history:  Raymond Jordan. is here today for his follow-up office visit.  Since last OV on 03/14/2024, pt is down 11 lbs.     03/14/24 04/17/24 14:00   Body Fat % 37.2 % 36.4 %  Muscle Mass (lbs) 164.2 lbs 160.2 lbs  Fat Mass (lbs) 102.4 lbs 96.4 lbs  Total Body Water (lbs) 123 lbs 118 lbs  Visceral Fat Rating  23 22    Counseling done on how various foods will affect these numbers and how to maximize success   Total lbs lost to date: -28 lbs Total Fat Mass in lbs lost to date: -9.2 Total weight loss percentage to date: -9.59 %   Nutrition Therapy He is currently journaling 1400-1500 calories with 110+ g protein per day with Cat 3 MP as guide and states he is following his eating plan approximately 88 % of the time.   - Tracking Calories/Macros: yes  - Eating More Whole Foods: yes  - Adequate Protein Intake: yes  - Adequate Water Intake: yes  - Skipping Meals: no -    - Sleeping 7-9 Hours/ Night: yes   Raymond Jordan is currently in the action stage of change. As such, his goal is to continue weight management plan.  He has agreed to: continue current plan   Physical Activity Pt is sometimes walking, but no regular exercise.    [name] has been advised to work up to 300-450 minutes of moderate intensity aerobic activity a week and strengthening exercises 2-3 times per week for cardiovascular health, weight loss maintenance and preservation of muscle mass.  He has agreed to : Think about enjoyable ways to increase daily physical activity and overcoming barriers to exercise, Increase physical activity in their day and reduce sedentary time  (increase NEAT)., Increase volume of physical activity to a goal of 240 minutes a week, and Combine aerobic and strengthening exercises for efficiency and improved cardiometabolic health.   Behavioral Modifications Evidence-based interventions for health behavior change were utilized today including the discussion of  1) self monitoring techniques:  journaling 2) problem-solving barriers:  none 3) self care:  none 4) SMART goals for next OV:  Journal intake and total at the end of the day Regarding patient's less desirable eating habits and patterns, we employed the technique of small changes.   We discussed the following today: increasing lean protein intake to established goals, increasing water intake , and work on tracking and journaling calories using tracking application Additional resources provided today: Handout on Daily Food Journaling Log, Handout on risks/ benefits of metformin  and associated Myths of use, and Handout on benefits of metformin  for weight loss   Medical Interventions/ Pharmacotherapy Previous Bariatric surgery: none Pharmacotherapy for weight loss: He is currently taking Metformin  500 mg BID for medical weight loss.    We discussed various medication options to help [facility] with his weight loss efforts and we both agreed to : Continue with current nutritional and behavioral strategies   B) OBESITY RELATED CONDITIONS ADDRESSED TODAY:   Prediabetes Assessment & Plan Lab Results  Component Value Date   HGBA1C 6.1 (H) 11/04/2023   HGBA1C 6.1 (  H) 05/03/2023   HGBA1C 6.0 07/03/2022   INSULIN  24.8 05/03/2023   INSULIN  22.7 12/22/2021   INSULIN  23.8 07/02/2021  Currently on Metformin  500 mg BID with good compliance and tolerance. Hunger and cravings well controlled. A1c at 6.1, optimal <5.7. Recheck A1c today and BMP. No acute concerns today. Cont decreasing simple carbs, increase lean proteins and increase water intake. Increase exercise as able. Will refill  Metformin  today.     Vitamin D  deficiency Assessment & Plan Lab Results  Component Value Date   VD25OH 57.6 11/04/2023   VD25OH 80.9 05/03/2023   VD25OH 46.3 07/03/2022  Currently on Ergo 50,000 units every 10 days. States good compliance and denies any side effects. Vit D levels at goal. Cont regimen (refill today) and recheck labs today.     Essential hypertension Assessment & Plan  BP Readings from Last 3 Encounters:  04/17/24 129/81  03/14/24 (!) 169/70  02/02/24 131/74   The ASCVD Risk score (Arnett DK, et al., 2019) failed to calculate for the following reasons:   Risk score cannot be calculated because patient has a medical history suggesting prior/existing ASCVD  Lab Results  Component Value Date   CREATININE 1.35 (H) 11/04/2023   Lab Results  Component Value Date   CREATININE 1.35 (H) 11/04/2023   BUN 25 11/04/2023   NA 138 11/04/2023   K 3.4 (L) 11/04/2023   CL 99 11/04/2023   CO2 22 11/04/2023   Currently on Chlorthalidone  25 mg daily, Losartan  25 mg daily, and Metoprolol  50 Mg BID. BP is well controlled today. Has been checking BP at home and it is well controlled. Pt asx. No acute concerns. Educated pt to control BP for adequate kidney function. Cont low-sodium, heart-healthy diet, increase water intake and exercise.     NSTEMI (non-ST elevated myocardial infarction) [facility]) Assessment & Plan  Is on plavix  75 mg daily. No acute concerns today. Encouraged to control BP to help kidney function to reduce risk of NSTEMI. Cont prudent nutritional meal and increase exercise. Cont to follow up with cardiologist.      Medications Discontinued During This Encounter  Medication Reason   Vitamin D , Ergocalciferol , (DRISDOL ) 1.25 MG (50000 UNIT) CAPS capsule Reorder   metFORMIN  (GLUCOPHAGE ) 500 MG tablet Reorder     Meds ordered this encounter  Medications   Vitamin D , Ergocalciferol , (DRISDOL ) 1.25 MG (50000 UNIT) CAPS capsule    Sig: Take 1 capsule by mouth  every 10 days as directed    Dispense:  9 capsule    Refill:  0   metFORMIN  (GLUCOPHAGE ) 500 MG tablet    Sig: Take 1 tablet (500 mg total) by mouth 2 (two) times daily with a meal.    Dispense:  60 tablet    Refill:  1      Follow up:   Return 05/25/2024 2:00 PM.  He was informed of the importance of frequent follow up visits to maximize his success with intensive lifestyle modifications for his multiple health conditions.  He was informed we would discuss his lab results at his next visit unless there is a critical issue that needs to be addressed sooner. Blu agreed to keep his next visit at the agreed upon time to discuss these results.   Weight Summary and Biometrics   Weight Lost Since Last Visit: 11lb  Weight Gained Since Last Visit: 0lb    Vitals Temp: 98.1 F (36.7 C) BP: 129/81 Pulse Rate: 66 SpO2: 95 %   Anthropometric Measurements Height: 5'  11 (1.803 m) Weight: 264 lb (119.7 kg) BMI (Calculated): 36.84 Weight at Last Visit: 275lb Weight Lost Since Last Visit: 11lb Weight Gained Since Last Visit: 0lb Starting Weight: 292lb Total Weight Loss (lbs): 28 lb (12.7 kg)   Body Composition  Body Fat %: 36.4 % Fat Mass (lbs): 96.4 lbs Muscle Mass (lbs): 160.2 lbs Total Body Water (lbs): 118 lbs Visceral Fat Rating : 22   Other Clinical Data Fasting: no Labs: no Today's Visit #: 47 Starting Date: 01/17/21    Objective:   PHYSICAL EXAM: Blood pressure 129/81, pulse 66, temperature 98.1 F (36.7 C), height 5' 11 (1.803 m), weight 264 lb (119.7 kg), SpO2 95%. Body mass index is 36.82 kg/m.  General: he is overweight, cooperative and in no acute distress. PSYCH: Has normal mood, affect and thought process.   HEENT: EOMI, sclerae are anicteric. Lungs: Normal breathing effort, no conversational dyspnea. Extremities: Moves * 4 Neurologic: A and O * 3, good insight  DIAGNOSTIC DATA REVIEWED: BMET    Component Value Date/Time   NA 138  11/04/2023 1633   K 3.4 (L) 11/04/2023 1633   CL 99 11/04/2023 1633   CO2 22 11/04/2023 1633   GLUCOSE 77 11/04/2023 1633   GLUCOSE 96 04/04/2021 0107   BUN 25 11/04/2023 1633   CREATININE 1.35 (H) 11/04/2023 1633   CALCIUM 9.4 11/04/2023 1633   GFRNONAA 50 (L) 04/04/2021 0107   GFRAA 79 01/18/2020 1535   Lab Results  Component Value Date   HGBA1C 6.1 (H) 11/04/2023   HGBA1C 6.2 (H) 01/18/2020   Lab Results  Component Value Date   INSULIN  24.8 05/03/2023   INSULIN  27.9 (H) 01/18/2020   Lab Results  Component Value Date   TSH 1.460 05/03/2023   CBC    Component Value Date/Time   WBC 5.5 05/03/2023 0755   WBC 9.1 04/04/2021 0107   RBC 4.45 05/03/2023 0755   RBC 5.13 04/04/2021 0107   HGB 12.6 (L) 05/03/2023 0755   HCT 38.7 05/03/2023 0755   PLT 202 05/03/2023 0755   MCV 87 05/03/2023 0755   MCH 28.3 05/03/2023 0755   MCH 28.5 04/04/2021 0107   MCHC 32.6 05/03/2023 0755   MCHC 32.7 04/04/2021 0107   RDW 13.8 05/03/2023 0755   Iron Studies No results found for: IRON, TIBC, FERRITIN, IRONPCTSAT Lipid Panel     Component Value Date/Time   CHOL 127 05/03/2023 0755   TRIG 90 05/03/2023 0755   HDL 46 05/03/2023 0755   CHOLHDL 2.6 06/30/2022 0710   CHOLHDL 2.7 05/04/2016 0736   VLDL 14 05/04/2016 0736   LDLCALC 64 05/03/2023 0755   Hepatic Function Panel     Component Value Date/Time   PROT 7.6 05/03/2023 0755   ALBUMIN 4.0 05/03/2023 0755   AST 23 05/03/2023 0755   ALT 15 05/03/2023 0755   ALKPHOS 46 05/03/2023 0755   BILITOT 0.2 05/03/2023 0755   BILIDIR 0.09 01/13/2019 0845   IBILI 0.2 05/04/2016 0736      Component Value Date/Time   TSH 1.460 05/03/2023 0755   Nutritional Lab Results  Component Value Date   VD25OH 57.6 11/04/2023   VD25OH 80.9 05/03/2023   VD25OH 46.3 07/03/2022    Attestations:   I, Feliciano Mingle, acting as a Stage manager for [employer], DO., have compiled all relevant documentation for today's office visit  on behalf of Raymond Jenkins, DO, while in the presence of [employer], DO.  I have reviewed the above documentation for accuracy  and completeness, and I agree with the above. Raymond Jordan, D.O.  The 21st Century Cures Act was signed into law in 2016 which includes the topic of electronic health records.  This provides immediate access to information in MyChart.  This includes consultation notes, operative notes, office notes, lab results and pathology reports.  If you have any questions about what you read please let us  know at your next visit so we can discuss your concerns and take corrective action if need be.  We are right here with you.

## 2024-04-18 LAB — VITAMIN D 25 HYDROXY (VIT D DEFICIENCY, FRACTURES): Vit D, 25-Hydroxy: 64.5 ng/mL (ref 30.0–100.0)

## 2024-04-18 LAB — BASIC METABOLIC PANEL WITH GFR
BUN/Creatinine Ratio: 16 (ref 10–24)
BUN: 23 mg/dL (ref 8–27)
CO2: 23 mmol/L (ref 20–29)
Calcium: 9.3 mg/dL (ref 8.6–10.2)
Chloride: 103 mmol/L (ref 96–106)
Creatinine, Ser: 1.43 mg/dL — ABNORMAL HIGH (ref 0.76–1.27)
Glucose: 78 mg/dL (ref 70–99)
Potassium: 3.7 mmol/L (ref 3.5–5.2)
Sodium: 139 mmol/L (ref 134–144)
eGFR: 56 mL/min/1.73 — ABNORMAL LOW (ref >59)

## 2024-04-18 LAB — HEMOGLOBIN A1C
Est. average glucose Bld gHb Est-mCnc: 126 mg/dL
Hgb A1c MFr Bld: 6 % — ABNORMAL HIGH (ref 4.8–5.6)

## 2024-04-25 ENCOUNTER — Other Ambulatory Visit: Payer: Self-pay | Admitting: Physician Assistant

## 2024-04-25 DIAGNOSIS — E7800 Pure hypercholesterolemia, unspecified: Secondary | ICD-10-CM

## 2024-04-28 MED ORDER — LOSARTAN POTASSIUM 25 MG PO TABS: 25.0000 mg | ORAL_TABLET | Freq: Every day | ORAL | 1 refills | Status: AC

## 2024-04-28 MED ORDER — CLOPIDOGREL BISULFATE 75 MG PO TABS
75.0000 mg | ORAL_TABLET | Freq: Every day | ORAL | 1 refills | Status: AC
Start: 2024-04-28 — End: ?

## 2024-04-28 MED ORDER — PRAVASTATIN SODIUM 80 MG PO TABS: 80.0000 mg | ORAL_TABLET | Freq: Every day | ORAL | 1 refills | Status: AC

## 2024-04-28 MED ORDER — CHLORTHALIDONE 25 MG PO TABS: 25.0000 mg | ORAL_TABLET | Freq: Every day | ORAL | 1 refills | Status: AC

## 2024-04-28 MED ORDER — METOPROLOL TARTRATE 50 MG PO TABS: 50.0000 mg | ORAL_TABLET | Freq: Two times a day (BID) | ORAL | 1 refills | Status: AC

## 2024-04-28 NOTE — Telephone Encounter (Signed)
 Called patient to make an appointment.

## 2024-05-12 ENCOUNTER — Other Ambulatory Visit: Payer: Self-pay | Admitting: Physician Assistant

## 2024-05-24 ENCOUNTER — Encounter (INDEPENDENT_AMBULATORY_CARE_PROVIDER_SITE_OTHER): Payer: Self-pay | Admitting: Family Medicine

## 2024-05-24 ENCOUNTER — Ambulatory Visit (INDEPENDENT_AMBULATORY_CARE_PROVIDER_SITE_OTHER): Admitting: Family Medicine

## 2024-05-24 VITALS — BP 124/72 | HR 55 | Temp 98.4°F | Ht 71.0 in | Wt 258.0 lb

## 2024-05-24 DIAGNOSIS — E669 Obesity, unspecified: Secondary | ICD-10-CM

## 2024-05-24 DIAGNOSIS — I10 Essential (primary) hypertension: Secondary | ICD-10-CM | POA: Diagnosis not present

## 2024-05-24 DIAGNOSIS — E559 Vitamin D deficiency, unspecified: Secondary | ICD-10-CM

## 2024-05-24 DIAGNOSIS — R7303 Prediabetes: Secondary | ICD-10-CM | POA: Diagnosis not present

## 2024-05-24 DIAGNOSIS — Z6836 Body mass index (BMI) 36.0-36.9, adult: Secondary | ICD-10-CM

## 2024-05-24 MED ORDER — METFORMIN HCL 500 MG PO TABS: 500.0000 mg | ORAL_TABLET | Freq: Two times a day (BID) | ORAL | 0 refills | Status: AC

## 2024-05-24 NOTE — Progress Notes (Signed)
 Raymond Jordan, D.O.  ABFM, ABOM Specializing in Clinical Bariatric Medicine  Office located at: [address] [place], [place]  [address]    FOR THE CHRONIC DISEASE OF OBESITY:   Generalized obesity starting BMI 40.73 BMI 36.0-36.9,adult current BMI 36  Weight Summary and Body Composition Analysis  Weight Lost Since Last Visit: 6lb  Weight Gained Since Last Visit: 0lb    Vitals Temp: 98.4 F (36.9 C) BP: 124/72 Pulse Rate: (!) 55 SpO2: 99 %   Anthropometric Measurements Height: 5' 11 (1.803 m) Weight: 258 lb (117 kg) BMI (Calculated): 36 Weight at Last Visit: 264lb Weight Lost Since Last Visit: 6lb Weight Gained Since Last Visit: 0lb Starting Weight: 292lb Total Weight Loss (lbs): 34 lb (15.4 kg)   Body Composition  Body Fat %: 34.9 % Fat Mass (lbs): 90.2 lbs Muscle Mass (lbs): 160.2 lbs Total Body Water (lbs): 110.8 lbs Visceral Fat Rating : 21   Other Clinical Data Fasting: yes Labs: no Today's Visit #: 48 Starting Date: 01/17/21    Chief complaint: Obesity  Interval History Raymond Burbach. is here for a follow-up office visit to discuss his progress with his obesity treatment plan. He is keeping a food journal and adhering to recommended goals of 1400-1500 calories and 110+ grams protein and states he is following his eating plan approximately 80 % of the time. He is doing NEAT daily.  He has experienced a weight loss of 6 lbs since last OV on 04/17/2024.   His dietary and life habits include:  - Tracking Calories/Macros: no  - Eating More Whole Foods: yes  - Adequate Protein Intake: no  - Adequate Water Intake: no  - Skipping Meals: no  - Sleeping 7-9 Hours/ Night: yes    04/17/24 14:00 05/24/24 10:00   Body Fat % 36.4 % 34.9 %  Muscle Mass (lbs) 160.2 lbs 160.2 lbs  Fat Mass (lbs) 96.4 lbs 90.2 lbs  Total Body Water (lbs) 118 lbs 110.8 lbs  Visceral Fat Rating  22 21   Counseling done on how various foods will  affect these numbers and how to maximize success  Total Fat mass lost to date: - 15.4 lbs Total lbs lost to date:  - 34 lbs Total weight loss percentage to date: - 11.64 %   Nutritional and Behavioral Counseling:  We discussed the following today: continue to work on maintaining a reduced calorie state, getting the recommended amount of protein, incorporating whole foods, making healthy choices, staying well hydrated and practicing mindfulness when eating.  Additional resources provided today: n/a  Evidence-based interventions for health behavior change were utilized today including the discussion of self monitoring techniques, problem-solving barriers and SMART goal setting techniques.   Regarding patient's less desirable eating habits and patterns, we employed the technique of small changes.   SMART Goal(s) created today: drinking 1 gallon of water daily   Recommended Dietary Goals Raymond Jordan is currently in the action stage of change. As such, his goal is to continue weight management plan.  He has agreed to continue keeping a food journal and adhering to recommended goals of 1400-1500 calories and 110+ grams protein    Recommended Physical Activity Goals Raymond Jordan has been advised to work up to 300-450 minutes of moderate intensity aerobic activity a week and strengthening exercises 2-3 times per week for cardiovascular health, weight loss maintenance and preservation of muscle mass.   He has agreed to START strengthening exercises with a goal of 2-3 sessions  a week    Medical Interventions and Pharmacotherapy Previous Bariatric surgery: n/a Pharmacotherapy: Cont Metformin  at current dose.    OBESITY RELATED CONDITIONS ADDRESSED TODAY:   Medications Discontinued During This Encounter  Medication Reason   metFORMIN  (GLUCOPHAGE ) 500 MG tablet Reorder     Meds ordered this encounter  Medications   metFORMIN  (GLUCOPHAGE ) 500 MG tablet    Sig: Take 1 tablet (500 mg total) by  mouth 2 (two) times daily with a meal.    Dispense:  60 tablet    Refill:  0     Prediabetes Assessment & Plan: Lab Results  Component Value Date   HGBA1C 6.0 (H) 04/17/2024   HGBA1C 6.1 (H) 11/04/2023   HGBA1C 6.1 (H) 05/03/2023   INSULIN  24.8 05/03/2023   INSULIN  22.7 12/22/2021   INSULIN  23.8 07/02/2021   Lab Results  Component Value Date   CREATININE 1.43 (H) 04/17/2024   BUN 23 04/17/2024   NA 139 04/17/2024   K 3.7 04/17/2024   CL 103 04/17/2024   CO2 23 04/17/2024   On Metformin  500 mg two times daily with reported good compliance and tolerance. No complaints of excessive hunger or cravings. Reviewed labs with pt. A1c slightly improved.BMP shows an increase in serum creatinine and a slight decrease in eGFR; however, this mild worsening of renal function does not appear to be related to Metformin  use or dose adjustments of the Metformin . Discussed that adequate hydration, regular exercise, good blood sugar and blood pressure control, and avoiding nephrotoxic substances such as NSAIDs can help support kidney function. Patient will follow up with PCP and obtain a repeat BMP in 2 months.     Essential hypertension Assessment & Plan: BP Readings from Last 3 Encounters:  05/24/24 124/72  04/17/24 129/81  03/14/24 (!) 169/70   BP at goal today. No acute concerns. Cont Chlorthalidone  25 mg daily, Losartan  25 mg daily, and Metoprolol  50 mg BID. Cont w/ low sodium diet, advance exercise as tolerated.    Vitamin D  deficiency Assessment & Plan: Lab Results  Component Value Date   VD25OH 64.5 04/17/2024   VD25OH 57.6 11/04/2023   VD25OH 80.9 05/03/2023   Reviewed labs with pt. Vit D level is at goal of 50 to 70. Cont Ergocalciferol  50,000 units q 10 days. Recheck as deemed medically necessary.    Objective:   PHYSICAL EXAM: Blood pressure 124/72, pulse (!) 55, temperature 98.4 F (36.9 C), height 5' 11 (1.803 m), weight 258 lb (117 kg), SpO2 99%. Body mass index is  35.98 kg/m.  General: he is overweight, cooperative and in no acute distress. PSYCH: Has normal mood, affect and thought process.   HEENT: EOMI, sclerae are anicteric. Lungs: Normal breathing effort, no conversational dyspnea. Extremities: Moves * 4 Neurologic: A and O * 3, good insight  DIAGNOSTIC DATA REVIEWED: BMET    Component Value Date/Time   NA 139 04/17/2024 1522   K 3.7 04/17/2024 1522   CL 103 04/17/2024 1522   CO2 23 04/17/2024 1522   GLUCOSE 78 04/17/2024 1522   GLUCOSE 96 04/04/2021 0107   BUN 23 04/17/2024 1522   CREATININE 1.43 (H) 04/17/2024 1522   CALCIUM 9.3 04/17/2024 1522   GFRNONAA 50 (L) 04/04/2021 0107   GFRAA 79 01/18/2020 1535   Lab Results  Component Value Date   HGBA1C 6.0 (H) 04/17/2024   HGBA1C 6.2 (H) 01/18/2020   Lab Results  Component Value Date   INSULIN  24.8 05/03/2023   INSULIN  27.9 (H) 01/18/2020  Lab Results  Component Value Date   TSH 1.460 05/03/2023   CBC    Component Value Date/Time   WBC 5.5 05/03/2023 0755   WBC 9.1 04/04/2021 0107   RBC 4.45 05/03/2023 0755   RBC 5.13 04/04/2021 0107   HGB 12.6 (L) 05/03/2023 0755   HCT 38.7 05/03/2023 0755   PLT 202 05/03/2023 0755   MCV 87 05/03/2023 0755   MCH 28.3 05/03/2023 0755   MCH 28.5 04/04/2021 0107   MCHC 32.6 05/03/2023 0755   MCHC 32.7 04/04/2021 0107   RDW 13.8 05/03/2023 0755   Iron Studies No results found for: IRON, TIBC, FERRITIN, IRONPCTSAT Lipid Panel     Component Value Date/Time   CHOL 127 05/03/2023 0755   TRIG 90 05/03/2023 0755   HDL 46 05/03/2023 0755   CHOLHDL 2.6 06/30/2022 0710   CHOLHDL 2.7 05/04/2016 0736   VLDL 14 05/04/2016 0736   LDLCALC 64 05/03/2023 0755   Hepatic Function Panel     Component Value Date/Time   PROT 7.6 05/03/2023 0755   ALBUMIN 4.0 05/03/2023 0755   AST 23 05/03/2023 0755   ALT 15 05/03/2023 0755   ALKPHOS 46 05/03/2023 0755   BILITOT 0.2 05/03/2023 0755   BILIDIR 0.09 01/13/2019 0845   IBILI 0.2  05/04/2016 0736      Component Value Date/Time   TSH 1.460 05/03/2023 0755   Nutritional Lab Results  Component Value Date   VD25OH 64.5 04/17/2024   VD25OH 57.6 11/04/2023   VD25OH 80.9 05/03/2023     Follow up:   Return 07/18/2024 at 4:00 PM.  He was informed of the importance of frequent follow up visits to maximize his success with intensive lifestyle modifications for his multiple health conditions.   Attestations:   I, Special Puri, acting as a stage manager for [employer], DO., have compiled all relevant documentation for today's office visit on behalf of Raymond Jenkins, DO, while in the presence of [employer], DO.  Pertinent positives were addressed with patient today. Reviewed by clinician on day of visit: allergies, medications, problem list, medical history, surgical history, family history, social history, and previous encounter notes.  I have reviewed the above documentation for accuracy and completeness, and I agree with the above. Raymond JINNY Jordan, D.O.  The 21st Century Cures Act was signed into law in 2016 which includes the topic of electronic health records.  This provides immediate access to information in MyChart. This includes consultation notes, operative notes, office notes, lab results and pathology reports.  If you have any questions about what you read please let us  know at your next visit so we can discuss your concerns and take corrective action if need be.  We are right here with you.

## 2024-05-25 ENCOUNTER — Ambulatory Visit (INDEPENDENT_AMBULATORY_CARE_PROVIDER_SITE_OTHER): Payer: Self-pay | Admitting: Family Medicine

## 2024-07-09 ENCOUNTER — Other Ambulatory Visit (INDEPENDENT_AMBULATORY_CARE_PROVIDER_SITE_OTHER): Payer: Self-pay | Admitting: Family Medicine

## 2024-07-09 DIAGNOSIS — E559 Vitamin D deficiency, unspecified: Secondary | ICD-10-CM

## 2024-07-18 ENCOUNTER — Ambulatory Visit (INDEPENDENT_AMBULATORY_CARE_PROVIDER_SITE_OTHER): Admitting: Family Medicine

## 2024-07-26 ENCOUNTER — Other Ambulatory Visit: Payer: Self-pay | Admitting: Physician Assistant

## 2024-07-26 DIAGNOSIS — E7800 Pure hypercholesterolemia, unspecified: Secondary | ICD-10-CM

## 2024-08-02 MED ORDER — NEXLIZET 180-10 MG PO TABS: 1.0000 | ORAL_TABLET | Freq: Every day | ORAL | 0 refills | Status: AC

## 2024-08-03 ENCOUNTER — Ambulatory Visit (INDEPENDENT_AMBULATORY_CARE_PROVIDER_SITE_OTHER): Admitting: Family Medicine

## 2024-08-03 ENCOUNTER — Encounter (INDEPENDENT_AMBULATORY_CARE_PROVIDER_SITE_OTHER): Payer: Self-pay | Admitting: Family Medicine

## 2024-08-03 ENCOUNTER — Telehealth: Payer: Self-pay | Admitting: Pharmacy Technician

## 2024-08-03 VITALS — BP 129/77 | HR 64 | Temp 98.0°F | Ht 71.0 in | Wt 258.0 lb

## 2024-08-03 DIAGNOSIS — Z6836 Body mass index (BMI) 36.0-36.9, adult: Secondary | ICD-10-CM | POA: Diagnosis not present

## 2024-08-03 DIAGNOSIS — E559 Vitamin D deficiency, unspecified: Secondary | ICD-10-CM | POA: Diagnosis not present

## 2024-08-03 DIAGNOSIS — E669 Obesity, unspecified: Secondary | ICD-10-CM

## 2024-08-03 DIAGNOSIS — I10 Essential (primary) hypertension: Secondary | ICD-10-CM

## 2024-08-03 DIAGNOSIS — R7303 Prediabetes: Secondary | ICD-10-CM | POA: Diagnosis not present

## 2024-08-03 NOTE — Progress Notes (Signed)
 "  Raymond Jordan, D.O.  ABFM, ABOM Specializing in Clinical Bariatric Medicine  Office located at: [address] [place], [place]  [address]     Obtain nonfasting labs next ov.      FOR THE CHRONIC DISEASE OF OBESITY:  Chief complaint: Obesity Raymond Jordan is here to discuss his progress with his obesity treatment plan.   History of present illness / Interval history:  Raymond Jordan. is here today for his follow-up office visit.  Since last OV on 05/24/24 with provider: Dr.O, patient has had fair adherence to reduced calorie nutritional plan.  Pt has been struggling with:  []  meal planning and prepping [x]  exercise []  sleep []  stressors []  mood []  chronic or acute medical conditions-  []  eating out more []  Nothing, doing great  Pt has been working on and improving their:  []  meal planning and prepping []  exercise []  sleep hygiene []  water intake []  strategies to better manage personal stressors []  strategies to better manage mood []  eating out less [x]  Nothing particular at this time  When asked by CMA prior to our office visit today, pt states they have been:  - Focused on eating fresh, unprocessed foods?  Yes   - Focused on eating lean proteins with each meal?  Yes   - Sleeping 7-9 Hours/ Night?  Yes   - Skipping Meals?  No   - States he is following his healthy eating plan approximately 80 % of the time.   Recent weight loss data history   05/24/24 10:00 08/03/24 15:00   Body Fat % 34.9 % 36.1 %  Muscle Mass (lbs) 160.2 lbs 157 lbs  Fat Mass (lbs) 90.2 lbs 93.2 lbs  Total Body Water (lbs) 110.8 lbs 115 lbs  Visceral Fat Rating  21 21   Total lbs lost to date: - 34 lbs Total Fat Mass in lbs lost to date: - 12.4 lbs Total weight loss percentage to date since starting program:  - 11.64 %    Generalized obesity starting BMI 40.73 BMI 36.0-36.9,adult current BMI 36  Physician directed Nutrition Therapy prescription: He is on keeping a food  journal and adhering to recommended goals of 1400-1500 calories and 110+ protein.     He has agreed to: Cat 3 with only 100 snack Calories     Physician directed Behavioral Modification prescription: Evidence-based interventions for healthy behavior change were utilized today including the discussion of small changes and SMART goals.  barriers to successful adherence to behavorial change for wt loss: not enough time  We discussed the following today: increasing lean protein intake to established goals, increasing water intake , work on meal planning and preparation, reading food labels , keeping healthy foods at home, and better snacking choices   Handout on NEAT, Handout on Characteristics of Successful Weight losers and Weight Maintainers.   Physician directed Physical Activity prescription:  Raymond Jordan is walking 15  minutes 2 days per week  barriers to successful adherence to exercise for wt loss: transportation issues  Raymond Jordan has been educated on importance of strength training to enhance/ sustain muscle mass along with prudent nutrition  Exercise prescription: He has agreed to Think about enjoyable ways to increase daily physical activity and overcoming barriers to exercise and Increase physical activity in their day and reduce sedentary time (increase NEAT).    Medical Interventions/ Pharmacotherapy  Previously tried medical weight loss medications/ therapies: GLP-1 + GIP:  Tirzepatide    Pharmacotherapy for weight loss: He is  currently taking Metformin  500 mg BID for medical weight loss.     We discussed various medication options to help [facility] with his weight loss efforts and we both agreed to:  Adequate clinical response to anti-obesity medication, continue current anti-obesity regimen  SPECIFIC behavorial / nutritional / exercise goals for next office visit:  Increase activity Increase water intake   B) OBESITY RELATED CONDITIONS ADDRESSED  TODAY:  Prediabetes Assessment & Plan Lab Results  Component Value Date   HGBA1C 6.0 (H) 04/17/2024   HGBA1C 6.1 (H) 11/04/2023   HGBA1C 6.1 (H) 05/03/2023   INSULIN  24.8 05/03/2023   INSULIN  22.7 12/22/2021   INSULIN  23.8 07/02/2021      Component Value Date/Time   NA 139 04/17/2024 1522   K 3.7 04/17/2024 1522   CL 103 04/17/2024 1522   CO2 23 04/17/2024 1522   GLUCOSE 78 04/17/2024 1522   GLUCOSE 96 04/04/2021 0107   BUN 23 04/17/2024 1522   CREATININE 1.43 (H) 04/17/2024 1522   CALCIUM 9.3 04/17/2024 1522   PROT 7.6 05/03/2023 0755   ALBUMIN 4.0 05/03/2023 0755   AST 23 05/03/2023 0755   ALT 15 05/03/2023 0755   ALKPHOS 46 05/03/2023 0755   BILITOT 0.2 05/03/2023 0755   EGFR 56 (L) 04/17/2024 1522   GFRNONAA 50 (L) 04/04/2021 0107     Prediabetes Condition stable, on medical therapy, reviewed previous records, On metformin . Patient states that he is unable to tell if he has had a difference since starting the Metformin . Patient had labs done by PCP and A1C has decreased to 5.9.  Kidney creatinine levels were previously 1.43 and when rechecked by PCP on 07/14/24 levels stayed the same. Explained to patient that he should avoid Aleve and Advil (NSAIDs). Will recheck labs at next OV. If creatinine levels do not improve Metformin  will be decreased to half a tab since medication can have an impact on kidney function.     Essential hypertension Assessment & Plan BP Readings from Last 3 Encounters:  08/03/24 129/77  05/24/24 124/72  04/17/24 129/81   The ASCVD Risk score (Arnett DK, et al., 2019) failed to calculate for the following reasons:   Risk score cannot be calculated because patient has a medical history suggesting prior/existing ASCVD   * - Cholesterol units were assumed  Lab Results  Component Value Date   CREATININE 1.43 (H) 04/17/2024   On Chlorthalidone  25 mg daily, Losartan  25 mg daily, and Metoprolol  50 mg BID with reported good compliance and  tolerance. BP today is at goal- well controlled. No acute concerns. Reminded patient the importance of having good control of BP in order to avoid excess stress on kidneys. Will reassess at next OV.     Vitamin D  deficiency Assessment & Plan Lab Results  Component Value Date   VD25OH 64.5 04/17/2024   VD25OH 57.6 11/04/2023   VD25OH 80.9 05/03/2023   Taking ERGO 50K units once every 10 days.  With reported good compliance and tolerance. Explained to patient as he sheds fat Vit D levels will rise naturally. No acute concerns today. Patient had VIT D labs on 07/14/24 levels were 53.9. This decrease was expected and completely at goal. Continue with supplementation.      Follow up:   Return 09/14/2024 at 4:00 PM   He was informed of the importance of frequent follow up visits to maximize his success with intensive lifestyle modifications for his multiple health conditions.   Weight Summary and Biometrics   Weight  Lost Since Last Visit: 0  Weight Gained Since Last Visit: 0   Vitals Temp: 98 F (36.7 C) BP: 129/77 Pulse Rate: 64 SpO2: 98 %   Anthropometric Measurements Height: 5' 11 (1.803 m) Weight: 258 lb (117 kg) BMI (Calculated): 36 Weight at Last Visit: 258lb Weight Lost Since Last Visit: 0 Weight Gained Since Last Visit: 0 Starting Weight: 292lb Total Weight Loss (lbs): 34 lb (15.4 kg)   Body Composition  Body Fat %: 36.1 % Fat Mass (lbs): 93.2 lbs Muscle Mass (lbs): 157 lbs Total Body Water (lbs): 115 lbs Visceral Fat Rating : 21   Other Clinical Data Fasting: no Labs: no Today's Visit #: 49 Starting Date: 01/18/20    Objective:   PHYSICAL EXAM: Blood pressure 129/77, pulse 64, temperature 98 F (36.7 C), height 5' 11 (1.803 m), weight 258 lb (117 kg), SpO2 98%. Body mass index is 35.98 kg/m. General: he is overweight, cooperative and in no acute distress. PSYCH: Has normal mood, affect and thought process.   HEENT: EOMI, sclerae are  anicteric. Lungs: Normal breathing effort, no conversational dyspnea. Extremities: Moves * 4 Neurologic: A and O * 3, good insight  DIAGNOSTIC DATA REVIEWED: BMET    Component Value Date/Time   NA 139 04/17/2024 1522   K 3.7 04/17/2024 1522   CL 103 04/17/2024 1522   CO2 23 04/17/2024 1522   GLUCOSE 78 04/17/2024 1522   GLUCOSE 96 04/04/2021 0107   BUN 23 04/17/2024 1522   CREATININE 1.43 (H) 04/17/2024 1522   CALCIUM 9.3 04/17/2024 1522   GFRNONAA 50 (L) 04/04/2021 0107   GFRAA 79 01/18/2020 1535   Lab Results  Component Value Date   HGBA1C 6.0 (H) 04/17/2024   HGBA1C 6.2 (H) 01/18/2020   Lab Results  Component Value Date   INSULIN  24.8 05/03/2023   INSULIN  27.9 (H) 01/18/2020   Lab Results  Component Value Date   TSH 1.460 05/03/2023   CBC    Component Value Date/Time   WBC 5.5 05/03/2023 0755   WBC 9.1 04/04/2021 0107   RBC 4.45 05/03/2023 0755   RBC 5.13 04/04/2021 0107   HGB 12.6 (L) 05/03/2023 0755   HCT 38.7 05/03/2023 0755   PLT 202 05/03/2023 0755   MCV 87 05/03/2023 0755   MCH 28.3 05/03/2023 0755   MCH 28.5 04/04/2021 0107   MCHC 32.6 05/03/2023 0755   MCHC 32.7 04/04/2021 0107   RDW 13.8 05/03/2023 0755   Iron Studies No results found for: IRON, TIBC, FERRITIN, IRONPCTSAT Lipid Panel     Component Value Date/Time   CHOL 127 05/03/2023 0755   TRIG 90 05/03/2023 0755   HDL 46 05/03/2023 0755   CHOLHDL 2.6 06/30/2022 0710   CHOLHDL 2.7 05/04/2016 0736   VLDL 14 05/04/2016 0736   LDLCALC 64 05/03/2023 0755   Hepatic Function Panel     Component Value Date/Time   PROT 7.6 05/03/2023 0755   ALBUMIN 4.0 05/03/2023 0755   AST 23 05/03/2023 0755   ALT 15 05/03/2023 0755   ALKPHOS 46 05/03/2023 0755   BILITOT 0.2 05/03/2023 0755   BILIDIR 0.09 01/13/2019 0845   IBILI 0.2 05/04/2016 0736      Component Value Date/Time   TSH 1.460 05/03/2023 0755   Nutritional Lab Results  Component Value Date   VD25OH 64.5 04/17/2024    VD25OH 57.6 11/04/2023   VD25OH 80.9 05/03/2023    Attestations:   I, Sonny Laroche, acting as a stage manager for [employer], DO.,  have compiled all relevant documentation for today's office visit on behalf of Raymond Jenkins, DO, while in the presence of [employer], DO.    I have reviewed the above documentation for accuracy and completeness, and I agree with the above. Raymond JINNY Jordan, D.O.  The 21st Century Cures Act was signed into law in 2016 which includes the topic of electronic health records.  This provides immediate access to information in MyChart.  This includes consultation notes, operative notes, office notes, lab results and pathology reports.  If you have any questions about what you read please let us  know at your next visit so we can discuss your concerns and take corrective action if need be.  We are right here with you.  "

## 2024-08-03 NOTE — Telephone Encounter (Signed)
 Pharmacy Patient Advocate Encounter  Received notification from [employer] that Prior Authorization for nexlizet  has been APPROVED from 08/03/24 to 08/03/25   PA #/Case ID/Reference #: [id number]

## 2024-08-03 NOTE — Telephone Encounter (Signed)
" ° °  Pharmacy Patient Advocate Encounter   Received notification from [facility] that prior authorization for NEXLIZET  is required/requested.   Insurance verification completed.   The patient is insured through [facility].   Per test claim: PA required; PA submitted to above mentioned insurance via Latent Key/confirmation #/EOC [id number] Status is pending  "

## 2024-09-01 ENCOUNTER — Ambulatory Visit: Admitting: Cardiology

## 2024-09-14 ENCOUNTER — Ambulatory Visit (INDEPENDENT_AMBULATORY_CARE_PROVIDER_SITE_OTHER): Admitting: Family Medicine
